# Patient Record
Sex: Female | Born: 1950 | Race: White | Hispanic: No | Marital: Married | Smoking: Never smoker
Health system: Southern US, Community
[De-identification: ages and names within clinical notes are randomized; demographics above are authoritative.]

## PROBLEM LIST (undated history)

## (undated) DIAGNOSIS — E559 Vitamin D deficiency, unspecified: Secondary | ICD-10-CM

## (undated) DIAGNOSIS — H6993 Unspecified Eustachian tube disorder, bilateral: Secondary | ICD-10-CM

## (undated) DIAGNOSIS — M5030 Other cervical disc degeneration, unspecified cervical region: Secondary | ICD-10-CM

## (undated) DIAGNOSIS — U071 COVID-19: Secondary | ICD-10-CM

## (undated) DIAGNOSIS — Z87442 Personal history of urinary calculi: Secondary | ICD-10-CM

## (undated) DIAGNOSIS — H6983 Other specified disorders of Eustachian tube, bilateral: Secondary | ICD-10-CM

## (undated) DIAGNOSIS — M19041 Primary osteoarthritis, right hand: Secondary | ICD-10-CM

## (undated) DIAGNOSIS — E538 Deficiency of other specified B group vitamins: Secondary | ICD-10-CM

## (undated) DIAGNOSIS — I10 Essential (primary) hypertension: Secondary | ICD-10-CM

## (undated) DIAGNOSIS — E7800 Pure hypercholesterolemia, unspecified: Secondary | ICD-10-CM

## (undated) DIAGNOSIS — E669 Obesity, unspecified: Secondary | ICD-10-CM

## (undated) DIAGNOSIS — F419 Anxiety disorder, unspecified: Secondary | ICD-10-CM

## (undated) DIAGNOSIS — F32A Depression, unspecified: Secondary | ICD-10-CM

## (undated) DIAGNOSIS — D649 Anemia, unspecified: Secondary | ICD-10-CM

## (undated) DIAGNOSIS — F5104 Psychophysiologic insomnia: Secondary | ICD-10-CM

## (undated) DIAGNOSIS — E2839 Other primary ovarian failure: Secondary | ICD-10-CM

## (undated) DIAGNOSIS — F329 Major depressive disorder, single episode, unspecified: Secondary | ICD-10-CM

## (undated) DIAGNOSIS — S0300XA Dislocation of jaw, unspecified side, initial encounter: Secondary | ICD-10-CM

## (undated) DIAGNOSIS — E8881 Metabolic syndrome: Secondary | ICD-10-CM

## (undated) DIAGNOSIS — K911 Postgastric surgery syndromes: Secondary | ICD-10-CM

## (undated) HISTORY — PX: PARTIAL COLECTOMY: SHX5273

## (undated) HISTORY — DX: Dislocation of jaw, unspecified side, initial encounter: S03.00XA

## (undated) HISTORY — DX: Other cervical disc degeneration, unspecified cervical region: M50.30

## (undated) HISTORY — DX: Unspecified eustachian tube disorder, bilateral: H69.93

## (undated) HISTORY — DX: Anxiety disorder, unspecified: F41.9

## (undated) HISTORY — DX: Metabolic syndrome: E88.810

## (undated) HISTORY — DX: Depression, unspecified: F32.A

## (undated) HISTORY — DX: Obesity, unspecified: E66.9

## (undated) HISTORY — DX: Deficiency of other specified B group vitamins: E53.8

## (undated) HISTORY — DX: Other specified disorders of eustachian tube, bilateral: H69.83

## (undated) HISTORY — PX: APPENDECTOMY: SHX54

## (undated) HISTORY — DX: Psychophysiologic insomnia: F51.04

## (undated) HISTORY — DX: Vitamin D deficiency, unspecified: E55.9

## (undated) HISTORY — PX: WISDOM TOOTH EXTRACTION: SHX21

## (undated) HISTORY — DX: Metabolic syndrome: E88.81

## (undated) HISTORY — DX: Primary osteoarthritis, right hand: M19.041

## (undated) HISTORY — PX: ABDOMINAL HYSTERECTOMY: SHX81

## (undated) HISTORY — DX: Postgastric surgery syndromes: K91.1

## (undated) HISTORY — DX: Other primary ovarian failure: E28.39

## (undated) HISTORY — DX: Major depressive disorder, single episode, unspecified: F32.9

---

## 1999-05-04 ENCOUNTER — Encounter: Payer: Self-pay | Admitting: Family Medicine

## 1999-05-04 ENCOUNTER — Encounter: Admission: RE | Admit: 1999-05-04 | Discharge: 1999-05-04 | Payer: Self-pay | Admitting: Family Medicine

## 2000-08-16 ENCOUNTER — Encounter: Admission: RE | Admit: 2000-08-16 | Discharge: 2000-08-16 | Payer: Self-pay | Admitting: Family Medicine

## 2000-08-16 ENCOUNTER — Encounter: Payer: Self-pay | Admitting: Family Medicine

## 2002-10-08 ENCOUNTER — Encounter: Payer: Self-pay | Admitting: Internal Medicine

## 2002-10-08 ENCOUNTER — Encounter: Admission: RE | Admit: 2002-10-08 | Discharge: 2002-10-08 | Payer: Self-pay | Admitting: Internal Medicine

## 2003-12-08 ENCOUNTER — Encounter: Admission: RE | Admit: 2003-12-08 | Discharge: 2003-12-08 | Payer: Self-pay | Admitting: Internal Medicine

## 2004-08-18 ENCOUNTER — Ambulatory Visit: Payer: Self-pay | Admitting: Internal Medicine

## 2004-10-25 ENCOUNTER — Ambulatory Visit: Payer: Self-pay | Admitting: Family Medicine

## 2004-11-02 ENCOUNTER — Ambulatory Visit: Payer: Self-pay | Admitting: Family Medicine

## 2004-11-05 ENCOUNTER — Ambulatory Visit: Payer: Self-pay | Admitting: Cardiology

## 2004-11-24 ENCOUNTER — Ambulatory Visit: Payer: Self-pay | Admitting: Family Medicine

## 2005-06-16 ENCOUNTER — Ambulatory Visit: Payer: Self-pay | Admitting: Family Medicine

## 2006-08-07 ENCOUNTER — Ambulatory Visit: Payer: Self-pay | Admitting: Family Medicine

## 2006-08-25 ENCOUNTER — Ambulatory Visit: Payer: Self-pay | Admitting: Internal Medicine

## 2006-09-01 ENCOUNTER — Ambulatory Visit: Payer: Self-pay | Admitting: Internal Medicine

## 2006-10-24 ENCOUNTER — Ambulatory Visit: Payer: Self-pay | Admitting: Gastroenterology

## 2006-10-24 LAB — HM COLONOSCOPY: HM Colonoscopy: NORMAL

## 2006-12-20 ENCOUNTER — Ambulatory Visit: Payer: Self-pay | Admitting: Family Medicine

## 2007-03-29 LAB — HM PAP SMEAR: HM PAP: NORMAL

## 2007-04-26 HISTORY — PX: GASTRIC BYPASS: SHX52

## 2008-08-14 ENCOUNTER — Ambulatory Visit: Payer: Self-pay | Admitting: Orthopedic Surgery

## 2010-06-22 LAB — HM DEXA SCAN

## 2011-01-12 ENCOUNTER — Ambulatory Visit: Payer: Self-pay | Admitting: Family Medicine

## 2013-07-03 LAB — LIPID PANEL
Cholesterol: 142 mg/dL (ref 0–200)
HDL: 56 mg/dL (ref 35–70)
LDL Cholesterol: 70 mg/dL
Triglycerides: 78 mg/dL (ref 40–160)

## 2013-10-13 ENCOUNTER — Emergency Department (HOSPITAL_COMMUNITY): Payer: BC Managed Care – PPO

## 2013-10-13 ENCOUNTER — Encounter (HOSPITAL_COMMUNITY): Payer: Self-pay | Admitting: Emergency Medicine

## 2013-10-13 ENCOUNTER — Emergency Department (HOSPITAL_COMMUNITY)
Admission: EM | Admit: 2013-10-13 | Discharge: 2013-10-13 | Disposition: A | Discharge status: Home / Self Care | Payer: BC Managed Care – PPO | Attending: Emergency Medicine | Admitting: Emergency Medicine

## 2013-10-13 DIAGNOSIS — R079 Chest pain, unspecified: Secondary | ICD-10-CM

## 2013-10-13 DIAGNOSIS — Z79899 Other long term (current) drug therapy: Secondary | ICD-10-CM | POA: Insufficient documentation

## 2013-10-13 DIAGNOSIS — I10 Essential (primary) hypertension: Secondary | ICD-10-CM | POA: Insufficient documentation

## 2013-10-13 DIAGNOSIS — E785 Hyperlipidemia, unspecified: Secondary | ICD-10-CM | POA: Insufficient documentation

## 2013-10-13 HISTORY — DX: Essential (primary) hypertension: I10

## 2013-10-13 HISTORY — DX: Pure hypercholesterolemia, unspecified: E78.00

## 2013-10-13 LAB — BASIC METABOLIC PANEL
BUN: 10 mg/dL (ref 6–23)
CALCIUM: 9.7 mg/dL (ref 8.4–10.5)
CO2: 27 mEq/L (ref 19–32)
CREATININE: 0.72 mg/dL (ref 0.50–1.10)
Chloride: 105 mEq/L (ref 96–112)
GFR, EST NON AFRICAN AMERICAN: 89 mL/min — AB (ref >90)
Glucose, Bld: 99 mg/dL (ref 70–99)
Potassium: 4.6 mEq/L (ref 3.7–5.3)
Sodium: 144 mEq/L (ref 137–147)

## 2013-10-13 LAB — D-DIMER, QUANTITATIVE: D-Dimer, Quant: 0.34 ug/mL-FEU (ref 0.00–0.48)

## 2013-10-13 LAB — CBC
HCT: 37.6 % (ref 36.0–46.0)
Hemoglobin: 12.2 g/dL (ref 12.0–15.0)
MCH: 28.9 pg (ref 26.0–34.0)
MCHC: 32.4 g/dL (ref 30.0–36.0)
MCV: 89.1 fL (ref 78.0–100.0)
PLATELETS: 320 10*3/uL (ref 150–400)
RBC: 4.22 MIL/uL (ref 3.87–5.11)
RDW: 13.5 % (ref 11.5–15.5)
WBC: 9 10*3/uL (ref 4.0–10.5)

## 2013-10-13 LAB — HEPATIC FUNCTION PANEL
ALK PHOS: 72 U/L (ref 39–117)
ALT: 14 U/L (ref 0–35)
AST: 18 U/L (ref 0–37)
Albumin: 3.5 g/dL (ref 3.5–5.2)
Total Bilirubin: 0.2 mg/dL — ABNORMAL LOW (ref 0.3–1.2)
Total Protein: 6.9 g/dL (ref 6.0–8.3)

## 2013-10-13 LAB — I-STAT TROPONIN, ED: Troponin i, poc: 0.01 ng/mL (ref 0.00–0.08)

## 2013-10-13 LAB — TROPONIN I
Troponin I: <0.3 ng/mL (ref <0.30)
Troponin I: <0.3 ng/mL (ref <0.30)

## 2013-10-13 MED ORDER — ASPIRIN 325 MG PO TABS: 325.0000 mg | ORAL_TABLET | Freq: Once | ORAL | Status: DC

## 2013-10-13 MED ORDER — SODIUM CHLORIDE 0.9 % IV BOLUS (SEPSIS)
250.0000 mL | Freq: Once | INTRAVENOUS | Status: AC
  Administered 2013-10-13: 250 mL via INTRAVENOUS

## 2013-10-13 NOTE — ED Provider Notes (Signed)
CSN: [id number]     Arrival date & time 10/13/13  1315 History   First MD Initiated Contact with Patient 10/13/13 1531     Chief Complaint  Patient presents with  . Chest Pain     (Consider location/radiation/quality/duration/timing/severity/associated sxs/prior Treatment) The history is provided by the patient. No language interpreter was used.  Alexandria Newman is a 63 y/o F with PMHx of HTN and HCL presenting to the ED with chest pain that started yesterday at approximately 2:00 PM when patient came back home from shopping. Reported that the pain is localized to the left side of the chest described as a muscular discomfort has been constant-reported that yesterday when she palpated the left side of her chest she reported discomfort. Reported that she thought this was just regular discomfort or muscular in nature - stated that she took a Tylenol arthritis secondary to the pain with minimal relief. Reported that she woke up this morning with the pain - stated that the pain is no longer reproducible upon palpation. Stated that exertion makes the pain worse. Denied chest tightness, shortness of breath, difficulty breathing, neck pain, back pain, jaw pain, numbness, tingling, blurred vision, sudden loss of vision, dizziness, lightheadedness, fainting, numbness or tingling down the left arm, radiation of pain, travel, leg swelling. PCP none   Past Medical History  Diagnosis Date  . Hypertension   . Hypercholesteremia    Past Surgical History  Procedure Laterality Date  . Gastric bypass  2009  . Appendectomy    . Abdominal hysterectomy     History reviewed. No pertinent family history. History  Substance Use Topics  . Smoking status: Never Smoker   . Smokeless tobacco: Not on file  . Alcohol Use: No   OB History   Grav Para Term Preterm Abortions TAB SAB Ect Mult Living            2     Review of Systems  Constitutional: Negative for fever and chills.  Respiratory:  Negative for chest tightness and shortness of breath.   Cardiovascular: Positive for chest pain. Negative for palpitations and leg swelling.  Gastrointestinal: Negative for nausea, vomiting, abdominal pain and diarrhea.  Musculoskeletal: Negative for back pain and neck pain.  Neurological: Negative for dizziness, weakness, light-headedness and numbness.      Allergies  Review of patient's allergies indicates no known allergies.  Home Medications   Prior to Admission medications   Medication Sig Start Date End Date Taking? Authorizing Pierce Barocio  ALPRAZolam Duanne Moron) 0.5 MG tablet Take 0.5 mg by mouth at bedtime.  10/01/13  Yes Historical Aureliano Oshields, MD  atenolol (TENORMIN) 50 MG tablet Take 50 mg by mouth 2 (two) times daily.  10/01/13  Yes Historical Marysol Wellnitz, MD  B-12, METHYLCOBALAMIN, SL Place 1 tablet under the tongue every morning.   Yes Historical Tris Howell, MD  buPROPion (WELLBUTRIN XL) 150 MG 24 hr tablet Take 150 mg by mouth daily.  09/17/13  Yes Historical Jamaiyah Pyle, MD  Cholecalciferol (VITAMIN D PO) Take 1 tablet by mouth daily.   Yes Historical Natacia Chaisson, MD  citalopram (CELEXA) 20 MG tablet Take 20 mg by mouth daily.  08/07/13  Yes Historical Masiah Lewing, MD  IRON PO Take 1 tablet by mouth daily.   Yes Historical Ival Basquez, MD  lisinopril (PRINIVIL,ZESTRIL) 10 MG tablet Take 10 mg by mouth 2 (two) times daily.  09/28/13  Yes Historical Azalea Cedar, MD  simvastatin (ZOCOR) 20 MG tablet Take 20 mg by mouth every evening.  09/28/13  Yes  Historical Ameera Tigue, MD   BP 118/54  Pulse 56  Temp(Src) 98.6 F (37 C) (Oral)  Resp 14  SpO2 97% Physical Exam  Nursing note and vitals reviewed. Constitutional: She is oriented to person, place, and time. She appears well-developed and well-nourished. No distress.  HENT:  Head: Normocephalic and atraumatic.  Mouth/Throat: Oropharynx is clear and moist. No oropharyngeal exudate.  Negative trismus  Eyes: Conjunctivae and EOM are normal. Pupils are equal,  round, and reactive to light. Right eye exhibits no discharge. Left eye exhibits no discharge.  Neck: Normal range of motion. Neck supple. No tracheal deviation present.  Negative neck stiffness Negative nuchal rigidity Negative cervical lymphadenopathy Negative pain upon palpation to C-spine  Cardiovascular: Normal rate, regular rhythm and normal heart sounds.  Exam reveals no friction rub.   No murmur heard. Pulses:      Radial pulses are 2+ on the right side, and 2+ on the left side.       Dorsalis pedis pulses are 2+ on the right side, and 2+ on the left side.  Cap refill cannot be performed secondary to patient's nails being painted Negative swelling or pitting edema identified to lower extremities bilaterally  Pulmonary/Chest: Effort normal and breath sounds normal. No respiratory distress. She has no wheezes. She has no rales. She exhibits no tenderness.  Patient is able to speak in full senses that difficulty Negative use of accessory muscles Negative stridor Negative pain upon palpation to the chest wall  Musculoskeletal: Normal range of motion.  Full ROM to upper and lower extremities without difficulty noted, negative ataxia noted.  Lymphadenopathy:    She has no cervical adenopathy.  Neurological: She is alert and oriented to person, place, and time. No cranial nerve deficit. She exhibits normal muscle tone. Coordination normal.  Cranial nerves III-XII grossly intact Strength 5+/5+ to upper and lower extremities bilaterally with resistance applied, equal distribution noted Equal grip strength  Negative facial drooping Negative slurred speech Negative aphasia  Skin: Skin is warm and dry. No rash noted. She is not diaphoretic. No erythema.  Psychiatric: She has a normal mood and affect. Her behavior is normal. Thought content normal.    ED Course  Procedures (including critical care time)  6:13 PM This Joaquin Knebel spoke with Dr. Garwin Brothers - [facility] - discussed  case, history, labs, imaging in great detail. As per physician, reported that she does not think patient needs to be admitted to the hospital - that she calculated the HEART score to be 3 and that this qualifies for the patient to be treated as an outpatient. Will discussed with attending.  Discussed this with attending physician, Dr. Purcell Mouton who recommended second troponin to be performed negative patient can be discharged home with followup with outpatient primary care Mikah Poss. Discussed with patient plan - who agrees to plan of care and understood.   Results for orders placed during the hospital encounter of 10/13/13  CBC      Result Value Ref Range   WBC 9.0  4.0 - 10.5 K/uL   RBC 4.22  3.87 - 5.11 MIL/uL   Hemoglobin 12.2  12.0 - 15.0 g/dL   HCT 37.6  36.0 - 46.0 %   MCV 89.1  78.0 - 100.0 fL   MCH 28.9  26.0 - 34.0 pg   MCHC 32.4  30.0 - 36.0 g/dL   RDW 13.5  11.5 - 15.5 %   Platelets 320  150 - 400 K/uL  BASIC METABOLIC PANEL  Result Value Ref Range   Sodium 144  137 - 147 mEq/L   Potassium 4.6  3.7 - 5.3 mEq/L   Chloride 105  96 - 112 mEq/L   CO2 27  19 - 32 mEq/L   Glucose, Bld 99  70 - 99 mg/dL   BUN 10  6 - 23 mg/dL   Creatinine, Ser 0.72  0.50 - 1.10 mg/dL   Calcium 9.7  8.4 - 10.5 mg/dL   GFR calc non Af Amer 89 (*) >90 mL/min   GFR calc Af Amer >90  >90 mL/min  TROPONIN I      Result Value Ref Range   Troponin I <0.30  <0.30 ng/mL  D-DIMER, QUANTITATIVE      Result Value Ref Range   D-Dimer, Quant 0.34  0.00 - 0.48 ug/mL-FEU  HEPATIC FUNCTION PANEL      Result Value Ref Range   Total Protein 6.9  6.0 - 8.3 g/dL   Albumin 3.5  3.5 - 5.2 g/dL   AST 18  0 - 37 U/L   ALT 14  0 - 35 U/L   Alkaline Phosphatase 72  39 - 117 U/L   Total Bilirubin 0.2 (*) 0.3 - 1.2 mg/dL   Bilirubin, Direct <0.2  0.0 - 0.3 mg/dL   Indirect Bilirubin NOT CALCULATED  0.3 - 0.9 mg/dL  TROPONIN I      Result Value Ref Range   Troponin I <0.30  <0.30 ng/mL  I-STAT TROPOININ, ED       Result Value Ref Range   Troponin i, poc 0.01  0.00 - 0.08 ng/mL   Comment 3             Labs Review Labs Reviewed  BASIC METABOLIC PANEL - Abnormal; Notable for the following:    GFR calc non Af Amer 89 (*)    All other components within normal limits  HEPATIC FUNCTION PANEL - Abnormal; Notable for the following:    Total Bilirubin 0.2 (*)    All other components within normal limits  CBC  TROPONIN I  D-DIMER, QUANTITATIVE  TROPONIN I  Randolm Idol, ED    Imaging Review Dg Chest 2 View  10/13/2013   CLINICAL DATA:  Chest pain  EXAM: CHEST  2 VIEW  COMPARISON:  None.  FINDINGS: The cardiomediastinal silhouette is unremarkable.  There is no evidence of focal airspace disease, pulmonary edema, suspicious pulmonary nodule/mass, pleural effusion, or pneumothorax. No acute bony abnormalities are identified.  IMPRESSION: No active cardiopulmonary disease.   Electronically Signed   By: Hassan Rowan M.D.   On: 10/13/2013 14:40     EKG Interpretation   Date/Time:  Sunday October 13 2013 13:21:27 EDT Ventricular Rate:  63 PR Interval:  154 QRS Duration: 88 QT Interval:  400 QTC Calculation: 409 R Axis:   75 Text Interpretation:  Normal sinus rhythm Low voltage QRS No previous  tracing Confirmed by Maryan Rued  MD, WHITNEY ([id number]) on 10/13/2013 5:48:10 PM      MDM   Final diagnoses:  Chest pain, unspecified chest pain type   Medications  sodium chloride 0.9 % bolus 250 mL (0 mLs Intravenous Stopped 10/13/13 1933)   Filed Vitals:   10/13/13 1815 10/13/13 1830 10/13/13 1845 10/13/13 1900  BP: 135/52 126/64 126/50 118/54  Pulse: 53 57 57 56  Temp:      TempSrc:      Resp: 10 14 17 14   SpO2: 98% 97% 98% 97%   EKG noted normal  sinus rhythm with a heart rate of 63 beats per minute. First troponin negative elevation. Second troponin negative elevation. D-dimer negative elevation - 0.34. CBC negative elevated white blood cell count. BMP negative findings-kidney functioning well.  Electrolytes balanced. Hepatic function panel negative elevation. Chest x-ray negative for acute cardiac pulmonary disease. Labs unremarkable. Doubt PE. Patient presenting to the ED with non-specific chest pain. Patient has not had chest pain or shortness of breath while in the ED setting. Patient stable, afebrile. Patient not septic appearing. Patient not hypoxic or in any form of respiratory distress. Patient has a HEART score of 3 and can be followed as an outpatient. Patient discharged. Discussed with patient to rest and stay hydrated. Referred patient to PCP and cardiology - discussed with patient to call and see physician first thing tomorrow morning. Discussed with patient to closely monitor symptoms and if symptoms are to worsen or change to report back to the ED - strict return instructions given.  Patient agreed to plan of care, understood, all questions answered.   Jamse Mead, PA-C 10/13/13 2101

## 2013-10-13 NOTE — ED Notes (Signed)
Patient has not any chest discomfort while been seen here in the ER.

## 2013-10-13 NOTE — ED Notes (Signed)
Pt reports non-radiating left sided chest tightness starting yesterday afternoon. States pain worse with palpation to area. Denies SOB, N/V, diaphoresis. NAD.

## 2013-10-13 NOTE — Discharge Instructions (Signed)
Please call your doctor for a followup appointment within 24-48 hours. When you talk to your doctor please let them know that you were seen in the emergency department and have them acquire all of your records so that they can discuss the findings with you and formulate a treatment plan to fully care for your new and ongoing problems. Please call and set-up an appointment with your primary care provider to be seen and assessed first thing tomorrow morning  Please call and set-up an appointment with cardiology Please rest and stay hydrated Please avoid any physical or strenuous activity Please continue to monitor symptoms closely and if symptoms are to worsen or change (fever greater than 101, dizziness, chills, sweating, nausea, vomiting, chest pain, shortness of breath, difficulty breathing, numbness, tingling, arm pain, neck pain, jaw pain, fainting) please report back to the ED immediately    Chest Pain (Nonspecific) It is often hard to give a specific diagnosis for the cause of chest pain. There is always a chance that your pain could be related to something serious, such as a heart attack or a blood clot in the lungs. You need to follow up with your health care provider for further evaluation. CAUSES   Heartburn.  Pneumonia or bronchitis.  Anxiety or stress.  Inflammation around your heart (pericarditis) or lung (pleuritis or pleurisy).  A blood clot in the lung.  A collapsed lung (pneumothorax). It can develop suddenly on its own (spontaneous pneumothorax) or from trauma to the chest.  Shingles infection (herpes zoster virus). The chest wall is composed of bones, muscles, and cartilage. Any of these can be the source of the pain.  The bones can be bruised by injury.  The muscles or cartilage can be strained by coughing or overwork.  The cartilage can be affected by inflammation and become sore (costochondritis). DIAGNOSIS  Lab tests or other studies may be needed to find the  cause of your pain. Your health care provider may have you take a test called an ambulatory electrocardiogram (ECG). An ECG records your heartbeat patterns over a 24-hour period. You may also have other tests, such as:  Transthoracic echocardiogram (TTE). During echocardiography, sound waves are used to evaluate how blood flows through your heart.  Transesophageal echocardiogram (TEE).  Cardiac monitoring. This allows your health care provider to monitor your heart rate and rhythm in real time.  Holter monitor. This is a portable device that records your heartbeat and can help diagnose heart arrhythmias. It allows your health care provider to track your heart activity for several days, if needed.  Stress tests by exercise or by giving medicine that makes the heart beat faster. TREATMENT   Treatment depends on what may be causing your chest pain. Treatment may include:  Acid blockers for heartburn.  Anti-inflammatory medicine.  Pain medicine for inflammatory conditions.  Antibiotics if an infection is present.  You may be advised to change lifestyle habits. This includes stopping smoking and avoiding alcohol, caffeine, and chocolate.  You may be advised to keep your head raised (elevated) when sleeping. This reduces the chance of acid going backward from your stomach into your esophagus. Most of the time, nonspecific chest pain will improve within 2-3 days with rest and mild pain medicine.  HOME CARE INSTRUCTIONS   If antibiotics were prescribed, take them as directed. Finish them even if you start to feel better.  For the next few days, avoid physical activities that bring on chest pain. Continue physical activities as directed.  Do not use any tobacco products, including cigarettes, chewing tobacco, or electronic cigarettes.  Avoid drinking alcohol.  Only take medicine as directed by your health care provider.  Follow your health care provider's suggestions for further testing  if your chest pain does not go away.  Keep any follow-up appointments you made. If you do not go to an appointment, you could develop lasting (chronic) problems with pain. If there is any problem keeping an appointment, call to reschedule. SEEK MEDICAL CARE IF:   Your chest pain does not go away, even after treatment.  You have a rash with blisters on your chest.  You have a fever. SEEK IMMEDIATE MEDICAL CARE IF:   You have increased chest pain or pain that spreads to your arm, neck, jaw, back, or abdomen.  You have shortness of breath.  You have an increasing cough, or you cough up blood.  You have severe back or abdominal pain.  You feel nauseous or vomit.  You have severe weakness.  You faint.  You have chills. This is an emergency. Do not wait to see if the pain will go away. Get medical help at once. Call your local emergency services (911 in U.S[place] Do not drive yourself to the hospital. MAKE SURE YOU:   Understand these instructions.  Will watch your condition.  Will get help right away if you are not doing well or get worse. Document Released: 01/19/2005 Document Revised: 04/16/2013 Document Reviewed: 11/15/2007 [facility] Patient Information 2015 [place], [place]. This information is not intended to replace advice given to you by your health care provider. Make sure you discuss any questions you have with your health care provider.   Emergency Department Resource Guide 1) Find a Doctor and Pay Out of Pocket Although you won't have to find out who is covered by your insurance plan, it is a good idea to ask around and get recommendations. You will then need to call the office and see if the doctor you have chosen will accept you as a new patient and what types of options they offer for patients who are self-pay. Some doctors offer discounts or will set up payment plans for their patients who do not have insurance, but you will need to ask so you aren't surprised when you  get to your appointment.  2) Contact Your Local Health Department Not all health departments have doctors that can see patients for sick visits, but many do, so it is worth a call to see if yours does. If you don't know where your local health department is, you can check in your phone book. The CDC also has a tool to help you locate your state's health department, and many state websites also have listings of all of their local health departments.  3) Find a [facility] If your illness is not likely to be very severe or complicated, you may want to try a walk in clinic. These are popping up all over the country in pharmacies, drugstores, and shopping centers. They're usually staffed by nurse practitioners or physician assistants that have been trained to treat common illnesses and complaints. They're usually fairly quick and inexpensive. However, if you have serious medical issues or chronic medical problems, these are probably not your best option.  No Primary Care Doctor: - Call Health Connect at  [phone] - they can help you locate a primary care doctor that  accepts your insurance, provides certain services, etc. - Physician Referral Service- [phone]  Chronic Pain Problems: Organization  Address  Phone   Notes  [facility]  [phone] Patients need to be referred by their primary care doctor.   Medication Assistance: Organization         Address  Phone   Notes  [facility] Medication [facility] [place] [address] [place], [place] [address] [phone] --Must be a resident of [facility] -- Must have NO insurance coverage whatsoever (no Medicaid/ Medicare, etc.) -- The pt. MUST have a primary care doctor that directs their care regularly and follows them in the community   [facility]  [phone]   [employer]  [phone]    Agencies that provide inexpensive medical care: Organization         Address  Phone    Notes  [place]  [phone]   [facility]    [phone]   [facility] [place], [place] [address] [phone]   [place] [address], [place] [phone]   Planned Parenthood    [phone]   [facility]    [phone]   [place] and [place] [address], [place] Phone:  [phone], Fax:  [phone] Hours of Operation:  9 am - 6 pm, M-F.  Also accepts Medicaid/Medicare and self-pay.  [facility] for [place] [address], [address], [place] Phone: [phone], Fax: [phone]. Hours of Operation:  8:30 am - 5:30 pm, M-F.  Also accepts Medicaid and self-pay.  [facility] [place] [address] [place] Phone: [phone]   [place], [place], [place] [phone], Ext. [phone] Mondays & Thursdays: 7-9 AM.  First 15 patients are seen on a first come, first serve basis.    [place] Providers:  Organization         Address  Phone   Notes  [facility] [address], [place] [phone] Also accepts self-pay patients.  [facility] ([place]) [address], [place]  [phone]   [facility], [address], [place] [phone]   [facility] [address] [place] [phone]   Lucianne Lei [address], [place]   [phone] Only accepts [place] Access [place] patients after they have their name applied to their card.   Self-Pay (no insurance) in [facility]:  Organization         Address  Phone   Notes  Sickle Cell Patients, [facility] [place] [phone]   [facility] [place] [phone]   [facility]  Urgent Care [place]  [place], [place], [place] [phone]   [facility]/Dr. Osei-Bonsu  [address] [place] or [address], [place] [phone] Phone number for both [place] and [place] locations is the same.  Urgent Medical and [facility] [address], [place] [phone]   [facility] [address] [place] [address], [place] or [address] [phone]   [facility] [address], [place] [phone], phone; [phone], fax Sees patients 1st and 3rd Saturday of every month.  Must not qualify  for public or private insurance (i.e. Medicaid, Medicare, [place] Health Choice, Veterans' Benefits)  Household income should be no more than 200% of the poverty level The clinic cannot treat you if you are pregnant or think you are pregnant  Sexually transmitted diseases are not treated at the clinic.    Dental Care: Organization         Address  Phone  Notes  [facility] Department of [facility] [phone] Accepts children up to age 62 who are enrolled in [place] or [place]; pregnant women with a Medicaid card; and children who have applied for Medicaid or [place] Health Choice, but were declined, whose parents can pay a reduced fee at time of service.  [facility] Department of [facility]  [address], [place] [phone] Accepts children up to age 76 who are enrolled in [place] or [place]; pregnant women with a Medicaid card; and children who have applied for Medicaid or [place] Health Choice, but were declined, whose parents can pay a reduced fee at time of service.  [place] Adult Dental Access PROGRAM  [place] [phone] Patients are seen by appointment only. Walk-ins are not accepted. [place] will see patients 50 years of age  and older. Monday - Tuesday (8am-5pm) Most Wednesdays (8:30-5pm) $30 per visit, cash only  [facility] Adult Dental Access PROGRAM  [address], [facility] [phone] Patients are seen by appointment only. Walk-ins are not accepted. [place] will see patients 93 years of age and older. One Wednesday Evening (Monthly: Volunteer Based).  $30 per visit, cash only  [place]  [phone] for adults; Children under age 72, call Graduate Pediatric Dentistry at [phone]. Children aged 51-14, please call [phone] to request a pediatric application.  Dental services are provided in all areas of dental care including fillings, crowns and bridges, complete and partial dentures, implants, gum treatment, root canals, and extractions. Preventive care is also provided. Treatment is provided to both adults and children. Patients are selected via a lottery and there is often a waiting list.   [facility] [address], [place]  [phone] [email or web address]   [facility] [address] [place], [place] [phone], Ext. [phone] Second and Fourth Thursday of each month, opens at 6:30 AM; Clinic ends at 9 AM.  Patients are seen on a first-come first-served basis, and a limited number are seen during each clinic.   [facility]  [address] Hillard Danker [place], [place] [phone]   Eligibility Requirements You must have lived in [place], [place], or [place] counties for at least the last three months.   You cannot be eligible for state or federal sponsored [employer], including [employer], [place], or [employer].   You generally cannot be eligible for healthcare insurance through your employer.    How to apply: Eligibility screenings are held every Tuesday and Wednesday afternoon from 1:00 pm until 4:00 pm. You do not need an appointment for the interview!  [facility] [address], [place], [place]   [place]  [place] Department  [place]  [phone]    Behavioral Health Resources in the Community: Intensive Outpatient Programs Organization         Address  Phone  Notes  [facility] [address] [place], [place] [phone]   [facility] Outpatient [address] [place], [place]   ADS: Alcohol & Drug Svcs [address], [place], [place]   [place] [address],  [place], [place] or [phone]   Substance Abuse Resources Organization         Address  Phone  Notes  Alcohol and Drug Services  [phone]   [facility]  [phone]   [facility] [place]   Chinita Pester  [phone]   Residential & Outpatient Substance Abuse Program  [phone]   Psychological Services Organization         Address  Phone  Notes  [facility] [place]  [place]  [phone]   [place] [address], [place] or [phone]    Mobile Crisis Teams Organization         Address  Phone  Notes  [facility], Mobile Crisis Care Unit  [phone]   [facility] Psychotherapeutic Services  [address] [place], [place]   [place] [address] [place] [phone]    Self-Help/Support Groups Organization         Address  Phone             Notes  [place]. of [place] - variety of support groups  [place] Call for more information  Narcotics Anonymous (NA), [facility] [address], [employer] [place]  2 meetings at this location   Special educational needs teacher         Address  Phone  Notes  ASAP Residential Treatment [place],    [place]  [phone]   [facility]  [address] [place] [address], [place], [place]   [place], [place] [phone] Admissions: 8am-3pm M-F  Incentives Substance [place] [address]    [place], [place] [phone]   [facility] [address] [place], [place], [place]   [facility] [address],  [place], [place]   [facility] - Intensive Outpatient [address]een [address], [place], [place]   [facility] ([place].,  [place], [place] [phone] or [phone]   [facility] ([facility]) [address] [place], [place] Accepts Medicaid  [facility] [place] [address]  [place] [phone] Substance Abuse/Addiction Treatment   [facility] Organization         Address  Phone  Notes  [facility]  [phone]   Domenic Schwab, PhD [address] Arlis Porta [place], [place]   [phone] or [phone]   [place]   [address] [place], [place] [phone]   [facility] [address] [place], [place] [phone] Insurance/Medicaid/sponsorship through [facility] and Families [address], [place]                                    [place], [place] [phone] [place] [address], [place] [phone]    Dr. Adele Schilder  [phone]   [facility] [place]  [facility]. 1) [address], [place] 2) [place] 3)  [place] [address], [place] [phone]  [phone]   [place] [phone] or [phone] (After Hours)

## 2013-10-13 NOTE — ED Notes (Signed)
Called and ordered patient a food tray.

## 2013-10-15 NOTE — ED Provider Notes (Signed)
Medical screening examination/treatment/procedure(s) were performed by non-physician practitioner and as supervising physician I was immediately available for consultation/collaboration.   EKG Interpretation   Date/Time:  Sunday October 13 2013 13:21:27 EDT Ventricular Rate:  63 PR Interval:  154 QRS Duration: 88 QT Interval:  400 QTC Calculation: 409 R Axis:   75 Text Interpretation:  Normal sinus rhythm Low voltage QRS No previous  tracing Confirmed by Maryan Rued  MD, Loree Fee ([id number]) on 10/13/2013 5:48:10 PM        Blanchie Dessert, MD 10/15/13 1503

## 2013-11-06 ENCOUNTER — Encounter (HOSPITAL_COMMUNITY): Payer: Self-pay | Admitting: Emergency Medicine

## 2013-11-06 ENCOUNTER — Observation Stay (HOSPITAL_COMMUNITY)
Admission: EM | Admit: 2013-11-06 | Discharge: 2013-11-07 | Disposition: A | Discharge status: Home / Self Care | Payer: BC Managed Care – PPO | Attending: Surgery | Admitting: Surgery

## 2013-11-06 ENCOUNTER — Emergency Department (HOSPITAL_COMMUNITY): Payer: BC Managed Care – PPO

## 2013-11-06 DIAGNOSIS — R112 Nausea with vomiting, unspecified: Secondary | ICD-10-CM | POA: Diagnosis not present

## 2013-11-06 DIAGNOSIS — K56609 Unspecified intestinal obstruction, unspecified as to partial versus complete obstruction: Secondary | ICD-10-CM

## 2013-11-06 DIAGNOSIS — E7800 Pure hypercholesterolemia, unspecified: Secondary | ICD-10-CM | POA: Insufficient documentation

## 2013-11-06 DIAGNOSIS — Z9884 Bariatric surgery status: Secondary | ICD-10-CM | POA: Insufficient documentation

## 2013-11-06 DIAGNOSIS — E860 Dehydration: Secondary | ICD-10-CM

## 2013-11-06 DIAGNOSIS — I10 Essential (primary) hypertension: Secondary | ICD-10-CM | POA: Insufficient documentation

## 2013-11-06 DIAGNOSIS — R1033 Periumbilical pain: Secondary | ICD-10-CM | POA: Diagnosis not present

## 2013-11-06 DIAGNOSIS — R1030 Lower abdominal pain, unspecified: Secondary | ICD-10-CM

## 2013-11-06 LAB — URINALYSIS, ROUTINE W REFLEX MICROSCOPIC
Glucose, UA: NEGATIVE mg/dL
Hgb urine dipstick: NEGATIVE
Ketones, ur: >80 mg/dL — AB
NITRITE: NEGATIVE
PH: 5.5 (ref 5.0–8.0)
Protein, ur: 30 mg/dL — AB
Specific Gravity, Urine: 1.027 (ref 1.005–1.030)
Urobilinogen, UA: 1 mg/dL (ref 0.0–1.0)

## 2013-11-06 LAB — CBC WITH DIFFERENTIAL/PLATELET
Basophils Absolute: 0 10*3/uL (ref 0.0–0.1)
Basophils Relative: 0 % (ref 0–1)
Eosinophils Absolute: 0 10*3/uL (ref 0.0–0.7)
Eosinophils Relative: 0 % (ref 0–5)
HEMATOCRIT: 40.3 % (ref 36.0–46.0)
Hemoglobin: 13.5 g/dL (ref 12.0–15.0)
LYMPHS PCT: 5 % — AB (ref 12–46)
Lymphs Abs: 1.1 10*3/uL (ref 0.7–4.0)
MCH: 29.3 pg (ref 26.0–34.0)
MCHC: 33.5 g/dL (ref 30.0–36.0)
MCV: 87.6 fL (ref 78.0–100.0)
MONO ABS: 0.9 10*3/uL (ref 0.1–1.0)
Monocytes Relative: 4 % (ref 3–12)
NEUTROS ABS: 18.6 10*3/uL — AB (ref 1.7–7.7)
Neutrophils Relative %: 91 % — ABNORMAL HIGH (ref 43–77)
Platelets: 324 10*3/uL (ref 150–400)
RBC: 4.6 MIL/uL (ref 3.87–5.11)
RDW: 13.2 % (ref 11.5–15.5)
WBC: 20.7 10*3/uL — AB (ref 4.0–10.5)

## 2013-11-06 LAB — COMPREHENSIVE METABOLIC PANEL
ALBUMIN: 4.2 g/dL (ref 3.5–5.2)
ALT: 14 U/L (ref 0–35)
ANION GAP: 20 — AB (ref 5–15)
AST: 23 U/L (ref 0–37)
Alkaline Phosphatase: 81 U/L (ref 39–117)
BILIRUBIN TOTAL: 0.5 mg/dL (ref 0.3–1.2)
BUN: 14 mg/dL (ref 6–23)
CHLORIDE: 98 meq/L (ref 96–112)
CO2: 21 mEq/L (ref 19–32)
CREATININE: 0.83 mg/dL (ref 0.50–1.10)
Calcium: 10.2 mg/dL (ref 8.4–10.5)
GFR calc non Af Amer: 73 mL/min — ABNORMAL LOW (ref >90)
GFR, EST AFRICAN AMERICAN: 85 mL/min — AB (ref >90)
Glucose, Bld: 212 mg/dL — ABNORMAL HIGH (ref 70–99)
Potassium: 4.4 mEq/L (ref 3.7–5.3)
Sodium: 139 mEq/L (ref 137–147)
TOTAL PROTEIN: 7.8 g/dL (ref 6.0–8.3)

## 2013-11-06 LAB — URINE MICROSCOPIC-ADD ON

## 2013-11-06 LAB — LIPASE, BLOOD: Lipase: 46 U/L (ref 11–59)

## 2013-11-06 MED ORDER — SODIUM CHLORIDE 0.9 % IV SOLN
1000.0000 mL | Freq: Once | INTRAVENOUS | Status: AC
  Administered 2013-11-06: 1000 mL via INTRAVENOUS

## 2013-11-06 MED ORDER — DIPHENHYDRAMINE HCL 50 MG/ML IJ SOLN
25.0000 mg | Freq: Once | INTRAMUSCULAR | Status: AC
  Administered 2013-11-06: 25 mg via INTRAVENOUS
  Filled 2013-11-06: qty 1

## 2013-11-06 MED ORDER — IOHEXOL 300 MG/ML  SOLN
100.0000 mL | Freq: Once | INTRAMUSCULAR | Status: AC | PRN
  Administered 2013-11-06: 100 mL via INTRAVENOUS

## 2013-11-06 MED ORDER — SODIUM CHLORIDE 0.9 % IV BOLUS (SEPSIS)
1000.0000 mL | Freq: Once | INTRAVENOUS | Status: AC
  Administered 2013-11-06: 1000 mL via INTRAVENOUS

## 2013-11-06 MED ORDER — SODIUM CHLORIDE 0.9 % IV SOLN
1000.0000 mL | INTRAVENOUS | Status: DC
  Administered 2013-11-06: 1000 mL via INTRAVENOUS

## 2013-11-06 MED ORDER — METOCLOPRAMIDE HCL 5 MG/ML IJ SOLN
10.0000 mg | Freq: Once | INTRAMUSCULAR | Status: AC
  Administered 2013-11-06: 10 mg via INTRAVENOUS
  Filled 2013-11-06: qty 2

## 2013-11-06 MED ORDER — ONDANSETRON HCL 4 MG/2ML IJ SOLN
4.0000 mg | Freq: Once | INTRAMUSCULAR | Status: AC
  Administered 2013-11-06: 4 mg via INTRAVENOUS
  Filled 2013-11-06: qty 2

## 2013-11-06 MED ORDER — ATENOLOL 25 MG PO TABS
50.0000 mg | ORAL_TABLET | Freq: Two times a day (BID) | ORAL | Status: DC
  Administered 2013-11-07 (×2): 50 mg via ORAL
  Filled 2013-11-06 (×3): qty 2

## 2013-11-06 NOTE — ED Provider Notes (Signed)
10:47 PM Patient signed out to me by Dr. Tomi Bamberger.  Pending CT scan.  Results for orders placed during the hospital encounter of 11/06/13  COMPREHENSIVE METABOLIC PANEL      Result Value Ref Range   Sodium 139  137 - 147 mEq/L   Potassium 4.4  3.7 - 5.3 mEq/L   Chloride 98  96 - 112 mEq/L   CO2 21  19 - 32 mEq/L   Glucose, Bld 212 (*) 70 - 99 mg/dL   BUN 14  6 - 23 mg/dL   Creatinine, Ser 0.83  0.50 - 1.10 mg/dL   Calcium 10.2  8.4 - 10.5 mg/dL   Total Protein 7.8  6.0 - 8.3 g/dL   Albumin 4.2  3.5 - 5.2 g/dL   AST 23  0 - 37 U/L   ALT 14  0 - 35 U/L   Alkaline Phosphatase 81  39 - 117 U/L   Total Bilirubin 0.5  0.3 - 1.2 mg/dL   GFR calc non Af Amer 73 (*) >90 mL/min   GFR calc Af Amer 85 (*) >90 mL/min   Anion gap 20 (*) 5 - 15  CBC WITH DIFFERENTIAL      Result Value Ref Range   WBC 20.7 (*) 4.0 - 10.5 K/uL   RBC 4.60  3.87 - 5.11 MIL/uL   Hemoglobin 13.5  12.0 - 15.0 g/dL   HCT 40.3  36.0 - 46.0 %   MCV 87.6  78.0 - 100.0 fL   MCH 29.3  26.0 - 34.0 pg   MCHC 33.5  30.0 - 36.0 g/dL   RDW 13.2  11.5 - 15.5 %   Platelets 324  150 - 400 K/uL   Neutrophils Relative % 91 (*) 43 - 77 %   Neutro Abs 18.6 (*) 1.7 - 7.7 K/uL   Lymphocytes Relative 5 (*) 12 - 46 %   Lymphs Abs 1.1  0.7 - 4.0 K/uL   Monocytes Relative 4  3 - 12 %   Monocytes Absolute 0.9  0.1 - 1.0 K/uL   Eosinophils Relative 0  0 - 5 %   Eosinophils Absolute 0.0  0.0 - 0.7 K/uL   Basophils Relative 0  0 - 1 %   Basophils Absolute 0.0  0.0 - 0.1 K/uL  URINALYSIS, ROUTINE W REFLEX MICROSCOPIC      Result Value Ref Range   Color, Urine YELLOW  YELLOW   APPearance CLEAR  CLEAR   Specific Gravity, Urine 1.027  1.005 - 1.030   pH 5.5  5.0 - 8.0   Glucose, UA NEGATIVE  NEGATIVE mg/dL   Hgb urine dipstick NEGATIVE  NEGATIVE   Bilirubin Urine MODERATE (*) NEGATIVE   Ketones, ur >80 (*) NEGATIVE mg/dL   Protein, ur 30 (*) NEGATIVE mg/dL   Urobilinogen, UA 1.0  0.0 - 1.0 mg/dL   Nitrite NEGATIVE  NEGATIVE    Leukocytes, UA SMALL (*) NEGATIVE  LIPASE, BLOOD      Result Value Ref Range   Lipase 46  11 - 59 U/L  URINE MICROSCOPIC-ADD ON      Result Value Ref Range   Squamous Epithelial / LPF FEW (*) RARE   WBC, UA 3-6  <3 WBC/hpf   RBC / HPF 0-2  <3 RBC/hpf   Bacteria, UA FEW (*) RARE   Casts HYALINE CASTS (*) NEGATIVE   Urine-Other MUCOUS PRESENT     Dg Chest 2 View  10/13/2013   CLINICAL DATA:  Chest pain  EXAM: CHEST  2 VIEW  COMPARISON:  None.  FINDINGS: The cardiomediastinal silhouette is unremarkable.  There is no evidence of focal airspace disease, pulmonary edema, suspicious pulmonary nodule/mass, pleural effusion, or pneumothorax. No acute bony abnormalities are identified.  IMPRESSION: No active cardiopulmonary disease.   Electronically Signed   By: Hassan Rowan M.D.   On: 10/13/2013 14:40   Ct Abdomen Pelvis W Contrast  11/06/2013   CLINICAL DATA:  Abdominal pain and nausea began at 0900 hours, onset of vomiting at 1500 hours, past history of hypertension and hypercholesterolemia  EXAM: CT ABDOMEN AND PELVIS WITH CONTRAST  TECHNIQUE: Multidetector CT imaging of the abdomen and pelvis was performed using the standard protocol following bolus administration of intravenous contrast. Sagittal and coronal MPR images reconstructed from axial data set.  CONTRAST:  177mL OMNIPAQUE IOHEXOL 300 MG/ML SOLN IV. Dilute oral contrast.  COMPARISON:  11/05/2004  FINDINGS: Mild bibasilar atelectasis.  Liver, spleen, pancreas, kidneys, and adrenal glands normal appearance.  Post gastric bypass surgery.  Rounded area fat attenuation with a thin rim is identified in the anterior LEFT mid abdomen, 5.0 x 3.2 x 3.4 cm most consistent with an area of fat necrosis likely postoperative.  Colon decompressed with interposition between liver and diaphragm.  Distal small bowel decompressed.  Proximal small bowel loops are dilated compatible with small bowel obstruction.  Transition from dilated to nondilated small bowel occur is  in the RIGHT mid abdomen axial image 63.  On coronal images 42-61, swirling of the small bowel mesentery is seen at the level of the obstruction, raising question of an internal hernia or volvulus.  No definite bowel wall thickening or perforation.  Tiny amount of free fluid dependently in pelvis.  No mass, adenopathy, free air, or acute osseous findings.  IMPRESSION: Small bowel obstruction in the RIGHT mid abdomen with swirling of the mesentery on particularly the coronal images raising question of internal hernia or volvulus.  Minimal free pelvic fluid but no evidence of perforation or wall thickening.  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  Findings called to Dr. Marlon Pel on 11/06/2012 at 2214 hr.   Electronically Signed   By: Lavonia Dana M.D.   On: 11/06/2013 22:16   Dg Abd Acute W/chest  11/06/2013   CLINICAL DATA:  Periumbilical pain with nausea and vomiting for 1 day  EXAM: ACUTE ABDOMEN SERIES (ABDOMEN 2 VIEW & CHEST 1 VIEW)  COMPARISON:  10/13/2013  FINDINGS: Heart size and vascular pattern are normal. Lungs are clear. No free air.  There are no abnormally dilated loops of bowel. However, there are abnormal air-fluid levels seen throughout small bowel as well as with and the large bowel. Anastomotic suture line is identified in the left lower quadrant. There are also sutures in the left upper quadrant.  IMPRESSION: Abnormal bowel gas pattern. Possibilities include gastroenteritis as well as bowel obstruction. CT of the abdomen and pelvis recommended.   Electronically Signed   By: Skipper Cliche M.D.   On: 11/06/2013 20:03    10:47 PM CT scan remarkable for small bowel obstruction. Will consult general surgery.  Discussed patient general surgery, who will see the patient and likely admit.  Montine Circle, PA-C 11/06/13 2247

## 2013-11-06 NOTE — ED Provider Notes (Signed)
CSN: [id number]     Arrival date & time 11/06/13  1750 History   First MD Initiated Contact with Patient 11/06/13 1842     Chief Complaint  Patient presents with  . Abdominal Pain  . Emesis     (Consider location/radiation/quality/duration/timing/severity/associated sxs/prior Treatment) HPI Patient reports about 8 AM this morning she started having nausea and lower abdominal pain that she describes as cramping. She was at work when the pain started and she went home and said she felt better in about 30 minutes. She went back to work and about 3 PM she states the nausea returned and the abdominal pain returned. She has had nausea and states she vomited 3-4 times at home, 3-4 times in the ambulance, in 3-4 times while she's been in the ED. She has lower abdominal pain that she describes as cramping. She denies back or flank pain. She states she felt like she had to have diarrhea but she couldn't. She has had leg cramps. She denies fever. She states when she was nauseated she was diaphoretic. She denies any abdominal bloating or  blood in her vomitus. She denies eating anything that may have made her ill. She has not been around anybody else is ill. Patient states she's never felt this way before. Patient has had a hysterectomy, appendectomy, and gastric bypass surgery.  PCP Dr Ancil Boozer, [facility]  Past Medical History  Diagnosis Date  . Hypertension   . Hypercholesteremia    Past Surgical History  Procedure Laterality Date  . Gastric bypass  2009  . Appendectomy    . Abdominal hysterectomy     History reviewed. No pertinent family history. History  Substance Use Topics  . Smoking status: Never Smoker   . Smokeless tobacco: Not on file  . Alcohol Use: No   Lives at home Lives with spouse Employed in an office  OB History   Grav Para Term Preterm Abortions TAB SAB Ect Mult Living            2     Review of Systems  All other systems reviewed and are negative.     Allergies   Review of patient's allergies indicates no known allergies.  Home Medications   Prior to Admission medications   Medication Sig Start Date End Date Taking? Authorizing Provider  ALPRAZolam Duanne Moron) 0.5 MG tablet Take 0.5 mg by mouth at bedtime.  10/01/13  Yes Historical Provider, MD  atenolol (TENORMIN) 50 MG tablet Take 50 mg by mouth 2 (two) times daily.  10/01/13  Yes Historical Provider, MD  B-12, METHYLCOBALAMIN, SL Place 1 tablet under the tongue every morning.   Yes Historical Provider, MD  buPROPion (WELLBUTRIN XL) 150 MG 24 hr tablet Take 150 mg by mouth daily.  09/17/13  Yes Historical Provider, MD  citalopram (CELEXA) 20 MG tablet Take 20 mg by mouth daily.  08/07/13  Yes Historical Provider, MD  lisinopril (PRINIVIL,ZESTRIL) 10 MG tablet Take 10 mg by mouth 2 (two) times daily.  09/28/13  Yes Historical Provider, MD  simvastatin (ZOCOR) 20 MG tablet Take 20 mg by mouth every evening.  09/28/13  Yes Historical Provider, MD   BP 132/71  Pulse 66  Temp(Src) 97.8 F (36.6 C) (Oral)  Resp 19  SpO2 97%  Vital signs normal   Physical Exam  Nursing note and vitals reviewed. Constitutional: She is oriented to person, place, and time. She appears well-developed and well-nourished.  Non-toxic appearance. She does not appear ill. No distress.  Patient  heard retching when she arrived in the ED  HENT:  Head: Normocephalic and atraumatic.  Right Ear: External ear normal.  Left Ear: External ear normal.  Nose: Nose normal. No mucosal edema or rhinorrhea.  Mouth/Throat: Mucous membranes are normal. No dental abscesses or uvula swelling.  Tongue is dry  Eyes: Conjunctivae and EOM are normal. Pupils are equal, round, and reactive to light.  Neck: Normal range of motion and full passive range of motion without pain. Neck supple.  Cardiovascular: Normal rate, regular rhythm and normal heart sounds.  Exam reveals no gallop and no friction rub.   No murmur heard. Pulmonary/Chest: Effort normal and  breath sounds normal. No respiratory distress. She has no wheezes. She has no rhonchi. She has no rales. She exhibits no tenderness and no crepitus.  Abdominal: Soft. Normal appearance and bowel sounds are normal. She exhibits no distension. There is no tenderness. There is no rebound and no guarding.  She has no pain the patient of her abdomen, it just makes her feel like she has to throw up.  Musculoskeletal: Normal range of motion. She exhibits no edema and no tenderness.  Moves all extremities well.   Neurological: She is alert and oriented to person, place, and time. She has normal strength. No cranial nerve deficit.  Skin: Skin is warm, dry and intact. No rash noted. No erythema. There is pallor.  Psychiatric: She has a normal mood and affect. Her speech is normal and behavior is normal. Her mood appears not anxious.    ED Course  Procedures (including critical care time)  Medications  0.9 %  sodium chloride infusion (1,000 mLs Intravenous New Bag/Given 11/06/13 1904)    Followed by  0.9 %  sodium chloride infusion (1,000 mLs Intravenous New Bag/Given 11/06/13 1911)  sodium chloride 0.9 % bolus 1,000 mL (not administered)  ondansetron (ZOFRAN) injection 4 mg (4 mg Intravenous Given 11/06/13 1757)  ondansetron (ZOFRAN) injection 4 mg (4 mg Intravenous Given 11/06/13 1841)  metoCLOPramide (REGLAN) injection 10 mg (10 mg Intravenous Given 11/06/13 1905)  diphenhydrAMINE (BENADRYL) injection 25 mg (25 mg Intravenous Given 11/06/13 1904)    Recheck 20:20 patient is feeling better. She states she's sleeping in her nausea and abdominal pain is better. We discussed her x-rays the need for CT scan and she is agreeable.  20:25 patient discussed with PA Marlon Pel. He is going to followup on her CT scan. She is going to continue getting IV fluids. If her scan is normal and she still feels better she can be discharged.   Labs Review Results for orders placed during the hospital encounter of 11/06/13   COMPREHENSIVE METABOLIC PANEL      Result Value Ref Range   Sodium 139  137 - 147 mEq/L   Potassium 4.4  3.7 - 5.3 mEq/L   Chloride 98  96 - 112 mEq/L   CO2 21  19 - 32 mEq/L   Glucose, Bld 212 (*) 70 - 99 mg/dL   BUN 14  6 - 23 mg/dL   Creatinine, Ser 0.83  0.50 - 1.10 mg/dL   Calcium 10.2  8.4 - 10.5 mg/dL   Total Protein 7.8  6.0 - 8.3 g/dL   Albumin 4.2  3.5 - 5.2 g/dL   AST 23  0 - 37 U/L   ALT 14  0 - 35 U/L   Alkaline Phosphatase 81  39 - 117 U/L   Total Bilirubin 0.5  0.3 - 1.2 mg/dL   GFR calc non  Af Amer 73 (*) >90 mL/min   GFR calc Af Amer 85 (*) >90 mL/min   Anion gap 20 (*) 5 - 15  CBC WITH DIFFERENTIAL      Result Value Ref Range   WBC 20.7 (*) 4.0 - 10.5 K/uL   RBC 4.60  3.87 - 5.11 MIL/uL   Hemoglobin 13.5  12.0 - 15.0 g/dL   HCT 40.3  36.0 - 46.0 %   MCV 87.6  78.0 - 100.0 fL   MCH 29.3  26.0 - 34.0 pg   MCHC 33.5  30.0 - 36.0 g/dL   RDW 13.2  11.5 - 15.5 %   Platelets 324  150 - 400 K/uL   Neutrophils Relative % 91 (*) 43 - 77 %   Neutro Abs 18.6 (*) 1.7 - 7.7 K/uL   Lymphocytes Relative 5 (*) 12 - 46 %   Lymphs Abs 1.1  0.7 - 4.0 K/uL   Monocytes Relative 4  3 - 12 %   Monocytes Absolute 0.9  0.1 - 1.0 K/uL   Eosinophils Relative 0  0 - 5 %   Eosinophils Absolute 0.0  0.0 - 0.7 K/uL   Basophils Relative 0  0 - 1 %   Basophils Absolute 0.0  0.0 - 0.1 K/uL  LIPASE, BLOOD      Result Value Ref Range   Lipase 46  11 - 59 U/L   Laboratory interpretation all normal except for leukocytosis consistent with vomiting      Imaging Review Dg Abd Acute W/chest  11/06/2013   CLINICAL DATA:  Periumbilical pain with nausea and vomiting for 1 day  EXAM: ACUTE ABDOMEN SERIES (ABDOMEN 2 VIEW & CHEST 1 VIEW)  COMPARISON:  10/13/2013  FINDINGS: Heart size and vascular pattern are normal. Lungs are clear. No free air.  There are no abnormally dilated loops of bowel. However, there are abnormal air-fluid levels seen throughout small bowel as well as with and the  large bowel. Anastomotic suture line is identified in the left lower quadrant. There are also sutures in the left upper quadrant.  IMPRESSION: Abnormal bowel gas pattern. Possibilities include gastroenteritis as well as bowel obstruction. CT of the abdomen and pelvis recommended.   Electronically Signed   By: Skipper Cliche M.D.   On: 11/06/2013 20:03     EKG Interpretation None      MDM   Final diagnoses:  Nausea and vomiting, vomiting of unspecified type  Abdominal pain, lower  Dehydration    Disposition pending   Rolland Porter, MD, [facility] Sander     Janice Norrie, MD 11/06/13 2028

## 2013-11-06 NOTE — H&P (Signed)
Alexandria Newman is an 63 y.o. female.   Chief Complaint: abd pain , nausea/vomiting HPI: 59 yof who had prior gastric bypass at [facility] a number of years ago as well as appy, hysterectomy and what she describes as laparotomies for "tapeworms" as child presents with less than 24 hours of abdominal pain associated with nausea/emesis. Belching, no real flatus today, having bms.  No fevers.  She has undergone ct and then I was called.  She tolerated ct contrast just fine and she has no more n/v.  States no real abdominal pain either. She feels almost back to normal self right now.  Past Medical History  Diagnosis Date  . Hypertension   . Hypercholesteremia     Past Surgical History  Procedure Laterality Date  . Gastric bypass  2009  . Appendectomy    . Abdominal hysterectomy      History reviewed. No pertinent family history. Social History:  reports that she has never smoked. She does not have any smokeless tobacco history on file. She reports that she does not drink alcohol or use illicit drugs.  Allergies: No Known Allergies  Meds reviewed  Results for orders placed during the hospital encounter of 11/06/13 (from the past 48 hour(s))  COMPREHENSIVE METABOLIC PANEL     Status: Abnormal   Collection Time    11/06/13  7:14 PM      Result Value Ref Range   Sodium 139  137 - 147 mEq/L   Potassium 4.4  3.7 - 5.3 mEq/L   Chloride 98  96 - 112 mEq/L   CO2 21  19 - 32 mEq/L   Glucose, Bld 212 (*) 70 - 99 mg/dL   BUN 14  6 - 23 mg/dL   Creatinine, Ser 0.83  0.50 - 1.10 mg/dL   Calcium 10.2  8.4 - 10.5 mg/dL   Total Protein 7.8  6.0 - 8.3 g/dL   Albumin 4.2  3.5 - 5.2 g/dL   AST 23  0 - 37 U/L   ALT 14  0 - 35 U/L   Alkaline Phosphatase 81  39 - 117 U/L   Total Bilirubin 0.5  0.3 - 1.2 mg/dL   GFR calc non Af Amer 73 (*) >90 mL/min   GFR calc Af Amer 85 (*) >90 mL/min   Comment: (NOTE)     The eGFR has been calculated using the CKD EPI equation.     This calculation has not  been validated in all clinical situations.     eGFR's persistently <90 mL/min signify possible Chronic Kidney     Disease.   Anion gap 20 (*) 5 - 15  CBC WITH DIFFERENTIAL     Status: Abnormal   Collection Time    11/06/13  7:14 PM      Result Value Ref Range   WBC 20.7 (*) 4.0 - 10.5 K/uL   RBC 4.60  3.87 - 5.11 MIL/uL   Hemoglobin 13.5  12.0 - 15.0 g/dL   HCT 40.3  36.0 - 46.0 %   MCV 87.6  78.0 - 100.0 fL   MCH 29.3  26.0 - 34.0 pg   MCHC 33.5  30.0 - 36.0 g/dL   RDW 13.2  11.5 - 15.5 %   Platelets 324  150 - 400 K/uL   Neutrophils Relative % 91 (*) 43 - 77 %   Neutro Abs 18.6 (*) 1.7 - 7.7 K/uL   Lymphocytes Relative 5 (*) 12 - 46 %   Lymphs Abs  1.1  0.7 - 4.0 K/uL   Monocytes Relative 4  3 - 12 %   Monocytes Absolute 0.9  0.1 - 1.0 K/uL   Eosinophils Relative 0  0 - 5 %   Eosinophils Absolute 0.0  0.0 - 0.7 K/uL   Basophils Relative 0  0 - 1 %   Basophils Absolute 0.0  0.0 - 0.1 K/uL  LIPASE, BLOOD     Status: None   Collection Time    11/06/13  7:14 PM      Result Value Ref Range   Lipase 46  11 - 59 U/L  URINALYSIS, ROUTINE W REFLEX MICROSCOPIC     Status: Abnormal   Collection Time    11/06/13  8:12 PM      Result Value Ref Range   Color, Urine YELLOW  YELLOW   APPearance CLEAR  CLEAR   Specific Gravity, Urine 1.027  1.005 - 1.030   pH 5.5  5.0 - 8.0   Glucose, UA NEGATIVE  NEGATIVE mg/dL   Hgb urine dipstick NEGATIVE  NEGATIVE   Bilirubin Urine MODERATE (*) NEGATIVE   Ketones, ur >80 (*) NEGATIVE mg/dL   Protein, ur 30 (*) NEGATIVE mg/dL   Urobilinogen, UA 1.0  0.0 - 1.0 mg/dL   Nitrite NEGATIVE  NEGATIVE   Leukocytes, UA SMALL (*) NEGATIVE  URINE MICROSCOPIC-ADD ON     Status: Abnormal   Collection Time    11/06/13  8:12 PM      Result Value Ref Range   Squamous Epithelial / LPF FEW (*) RARE   WBC, UA 3-6  <3 WBC/hpf   RBC / HPF 0-2  <3 RBC/hpf   Bacteria, UA FEW (*) RARE   Casts HYALINE CASTS (*) NEGATIVE   Urine-Other MUCOUS PRESENT     Ct  Abdomen Pelvis W Contrast  11/06/2013   CLINICAL DATA:  Abdominal pain and nausea began at 0900 hours, onset of vomiting at 1500 hours, past history of hypertension and hypercholesterolemia  EXAM: CT ABDOMEN AND PELVIS WITH CONTRAST  TECHNIQUE: Multidetector CT imaging of the abdomen and pelvis was performed using the standard protocol following bolus administration of intravenous contrast. Sagittal and coronal MPR images reconstructed from axial data set.  CONTRAST:  16mL OMNIPAQUE IOHEXOL 300 MG/ML SOLN IV. Dilute oral contrast.  COMPARISON:  11/05/2004  FINDINGS: Mild bibasilar atelectasis.  Liver, spleen, pancreas, kidneys, and adrenal glands normal appearance.  Post gastric bypass surgery.  Rounded area fat attenuation with a thin rim is identified in the anterior LEFT mid abdomen, 5.0 x 3.2 x 3.4 cm most consistent with an area of fat necrosis likely postoperative.  Colon decompressed with interposition between liver and diaphragm.  Distal small bowel decompressed.  Proximal small bowel loops are dilated compatible with small bowel obstruction.  Transition from dilated to nondilated small bowel occur is in the RIGHT mid abdomen axial image 63.  On coronal images 42-61, swirling of the small bowel mesentery is seen at the level of the obstruction, raising question of an internal hernia or volvulus.  No definite bowel wall thickening or perforation.  Tiny amount of free fluid dependently in pelvis.  No mass, adenopathy, free air, or acute osseous findings.  IMPRESSION: Small bowel obstruction in the RIGHT mid abdomen with swirling of the mesentery on particularly the coronal images raising question of internal hernia or volvulus.  Minimal free pelvic fluid but no evidence of perforation or wall thickening.  Post gastric bypass surgery with area of probable postoperative fat  necrosis in the anterior LEFT mid abdomen.  Findings called to Dr. Marlon Pel on 11/06/2012 at 2214 hr.   Electronically Signed   By: Lavonia Dana M.D.   On: 11/06/2013 22:16   Dg Abd Acute W/chest  11/06/2013   CLINICAL DATA:  Periumbilical pain with nausea and vomiting for 1 day  EXAM: ACUTE ABDOMEN SERIES (ABDOMEN 2 VIEW & CHEST 1 VIEW)  COMPARISON:  10/13/2013  FINDINGS: Heart size and vascular pattern are normal. Lungs are clear. No free air.  There are no abnormally dilated loops of bowel. However, there are abnormal air-fluid levels seen throughout small bowel as well as with and the large bowel. Anastomotic suture line is identified in the left lower quadrant. There are also sutures in the left upper quadrant.  IMPRESSION: Abnormal bowel gas pattern. Possibilities include gastroenteritis as well as bowel obstruction. CT of the abdomen and pelvis recommended.   Electronically Signed   By: Skipper Cliche M.D.   On: 11/06/2013 20:03    Review of Systems  Constitutional: Negative for fever and chills.  Respiratory: Negative for cough.   Cardiovascular: Negative for chest pain.  Gastrointestinal: Positive for nausea, vomiting and abdominal pain.    Blood pressure 125/65, pulse 74, temperature 97.8 F (36.6 C), temperature source Oral, resp. rate 21, SpO2 96.00%. Physical Exam  Constitutional: She appears well-developed and well-nourished.  Eyes: No scleral icterus.  Cardiovascular: Normal rate, regular rhythm and normal heart sounds.   Respiratory: Effort normal and breath sounds normal. She has no wheezes. She has no rales.  GI: Soft. Bowel sounds are normal. She exhibits no distension. There is no tenderness. No hernia.       Assessment/Plan Psbo, History gastric bypass and multiple abdominal surgeries  She clinically looks normal now with normal vitals, negative exam.  I am however concerned about ct scan and her wbc.  Her exam though is reliable and I don't think she needs to go to or right now. If she had ongoing bowel ischemia or internal hernia I think she would not have improved as she has.  I will make npo,  repeat films and labs in the am.  If she worsens before then will consider going to or. Discussed plan with she and her husband.  Dim Meisinger 11/06/2013, 10:58 PM

## 2013-11-06 NOTE — ED Notes (Addendum)
Pt to ED via EMS with c/o abdominal pain associated with nausea and vomiting. Pt Pt reports abdominal pain and nausea started at 900 today then vomiting onset at 1500. Pt vomiting when arriving to ED, given 4mg  zofran IV. Pt also reports legs cramps. Per EMS, BP-140/60, CBG-120.

## 2013-11-07 ENCOUNTER — Observation Stay (HOSPITAL_COMMUNITY): Payer: BC Managed Care – PPO

## 2013-11-07 DIAGNOSIS — R143 Flatulence: Secondary | ICD-10-CM | POA: Diagnosis not present

## 2013-11-07 DIAGNOSIS — R141 Gas pain: Secondary | ICD-10-CM | POA: Diagnosis not present

## 2013-11-07 LAB — BASIC METABOLIC PANEL
ANION GAP: 15 (ref 5–15)
BUN: 10 mg/dL (ref 6–23)
CALCIUM: 7.9 mg/dL — AB (ref 8.4–10.5)
CO2: 20 mEq/L (ref 19–32)
Chloride: 110 mEq/L (ref 96–112)
Creatinine, Ser: 0.74 mg/dL (ref 0.50–1.10)
GFR calc Af Amer: >90 mL/min (ref >90)
GFR, EST NON AFRICAN AMERICAN: 88 mL/min — AB (ref >90)
Glucose, Bld: 81 mg/dL (ref 70–99)
Potassium: 4.3 mEq/L (ref 3.7–5.3)
SODIUM: 145 meq/L (ref 137–147)

## 2013-11-07 LAB — CBC
HCT: 33.5 % — ABNORMAL LOW (ref 36.0–46.0)
Hemoglobin: 10.9 g/dL — ABNORMAL LOW (ref 12.0–15.0)
MCH: 29.1 pg (ref 26.0–34.0)
MCHC: 32.5 g/dL (ref 30.0–36.0)
MCV: 89.6 fL (ref 78.0–100.0)
PLATELETS: 258 10*3/uL (ref 150–400)
RBC: 3.74 MIL/uL — AB (ref 3.87–5.11)
RDW: 13.4 % (ref 11.5–15.5)
WBC: 14.7 10*3/uL — AB (ref 4.0–10.5)

## 2013-11-07 MED ORDER — CHLORHEXIDINE GLUCONATE 0.12 % MT SOLN: 15.0000 mL | Freq: Two times a day (BID) | OROMUCOSAL | Status: DC

## 2013-11-07 MED ORDER — ACETAMINOPHEN 325 MG PO TABS: 650.0000 mg | ORAL_TABLET | Freq: Four times a day (QID) | ORAL | Status: AC | PRN

## 2013-11-07 MED ORDER — ACETAMINOPHEN 325 MG PO TABS: 650.0000 mg | ORAL_TABLET | Freq: Four times a day (QID) | ORAL | Status: DC | PRN

## 2013-11-07 MED ORDER — ENOXAPARIN SODIUM 40 MG/0.4ML ~~LOC~~ SOLN
40.0000 mg | Freq: Every day | SUBCUTANEOUS | Status: DC
  Administered 2013-11-07: 40 mg via SUBCUTANEOUS
  Filled 2013-11-07: qty 0.4

## 2013-11-07 MED ORDER — MORPHINE SULFATE 2 MG/ML IJ SOLN: 2.0000 mg | INTRAMUSCULAR | Status: DC | PRN

## 2013-11-07 MED ORDER — ACETAMINOPHEN 650 MG RE SUPP: 650.0000 mg | Freq: Four times a day (QID) | RECTAL | Status: DC | PRN

## 2013-11-07 MED ORDER — BIOTENE DRY MOUTH MT LIQD: 15.0000 mL | Freq: Two times a day (BID) | OROMUCOSAL | Status: DC

## 2013-11-07 MED ORDER — SODIUM CHLORIDE 0.9 % IV SOLN
INTRAVENOUS | Status: DC
  Administered 2013-11-07 (×2): via INTRAVENOUS

## 2013-11-07 MED ORDER — ONDANSETRON HCL 4 MG/2ML IJ SOLN: 4.0000 mg | Freq: Four times a day (QID) | INTRAMUSCULAR | Status: DC | PRN

## 2013-11-07 MED ORDER — PANTOPRAZOLE SODIUM 40 MG IV SOLR
40.0000 mg | Freq: Every day | INTRAVENOUS | Status: DC
  Administered 2013-11-07: 40 mg via INTRAVENOUS
  Filled 2013-11-07 (×2): qty 40

## 2013-11-07 NOTE — Progress Notes (Signed)
Subjective: She denies any abdominal pain or nausea  Objective: Vital signs in last 24 hours: Temp:  [97.8 F (36.6 C)-98.9 F (37.2 C)] 98.5 F (36.9 C) (07/16 1010) Pulse Rate:  [57-75] 57 (07/16 1010) Resp:  [11-35] 16 (07/16 1010) BP: (110-150)/(50-71) 121/54 mmHg (07/16 1010) SpO2:  [94 %-100 %] 100 % (07/16 1010) Weight:  [186 lb 4.6 oz (84.5 kg)] 186 lb 4.6 oz (84.5 kg) (07/16 0035) Last BM Date: 11/06/13  Intake/Output from previous day: 07/15 0701 - 07/16 0700 In: 0  Out: 200 [Urine:200] Intake/Output this shift:    Abdomen is completely soft, non tender, non-distended  Lab Results:   Recent Labs  11/06/13 1914 11/07/13 0542  WBC 20.7* 14.7*  HGB 13.5 10.9*  HCT 40.3 33.5*  PLT 324 258   BMET  Recent Labs  11/06/13 1914 11/07/13 0542  NA 139 145  K 4.4 4.3  CL 98 110  CO2 21 20  GLUCOSE 212* 81  BUN 14 10  CREATININE 0.83 0.74  CALCIUM 10.2 7.9*   PT/INR No results found for this basename: LABPROT, INR,  in the last 72 hours ABG No results found for this basename: PHART, PCO2, PO2, HCO3,  in the last 72 hours  Studies/Results: Ct Abdomen Pelvis W Contrast  11/06/2013   CLINICAL DATA:  Abdominal pain and nausea began at 0900 hours, onset of vomiting at 1500 hours, past history of hypertension and hypercholesterolemia  EXAM: CT ABDOMEN AND PELVIS WITH CONTRAST  TECHNIQUE: Multidetector CT imaging of the abdomen and pelvis was performed using the standard protocol following bolus administration of intravenous contrast. Sagittal and coronal MPR images reconstructed from axial data set.  CONTRAST:  152mL OMNIPAQUE IOHEXOL 300 MG/ML SOLN IV. Dilute oral contrast.  COMPARISON:  11/05/2004  FINDINGS: Mild bibasilar atelectasis.  Liver, spleen, pancreas, kidneys, and adrenal glands normal appearance.  Post gastric bypass surgery.  Rounded area fat attenuation with Newman thin rim is identified in the anterior LEFT mid abdomen, 5.0 x 3.2 x 3.4 cm most  consistent with an area of fat necrosis likely postoperative.  Colon decompressed with interposition between liver and diaphragm.  Distal small bowel decompressed.  Proximal small bowel loops are dilated compatible with small bowel obstruction.  Transition from dilated to nondilated small bowel occur is in the RIGHT mid abdomen axial image 63.  On coronal images 42-61, swirling of the small bowel mesentery is seen at the level of the obstruction, raising question of an internal hernia or volvulus.  No definite bowel wall thickening or perforation.  Tiny amount of free fluid dependently in pelvis.  No mass, adenopathy, free air, or acute osseous findings.  IMPRESSION: Small bowel obstruction in the RIGHT mid abdomen with swirling of the mesentery on particularly the coronal images raising question of internal hernia or volvulus.  Minimal free pelvic fluid but no evidence of perforation or wall thickening.  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  Findings called to Dr. Marlon Pel on 11/06/2012 at 2214 hr.   Electronically Signed   By: Lavonia Dana M.D.   On: 11/06/2013 22:16   Dg Abd Acute W/chest  11/06/2013   CLINICAL DATA:  Periumbilical pain with nausea and vomiting for 1 day  EXAM: ACUTE ABDOMEN SERIES (ABDOMEN 2 VIEW & CHEST 1 VIEW)  COMPARISON:  10/13/2013  FINDINGS: Heart size and vascular pattern are normal. Lungs are clear. No free air.  There are no abnormally dilated  loops of bowel. However, there are abnormal air-fluid levels seen throughout small bowel as well as with and the large bowel. Anastomotic suture line is identified in the left lower quadrant. There are also sutures in the left upper quadrant.  IMPRESSION: Abnormal bowel gas pattern. Possibilities include gastroenteritis as well as bowel obstruction. CT of the abdomen and pelvis recommended.   Electronically Signed   By: Skipper Cliche M.D.   On: 11/06/2013 20:03   Dg  Abd Portable 2v  11/07/2013   CLINICAL DATA:  Followup small bowel obstruction.  EXAM: PORTABLE ABDOMEN - 2 VIEW  COMPARISON:  CT, 11/06/2013  FINDINGS: There is no significant bowel dilation. Contrast is seen throughout most of the colon. Air-fluid levels are noted on the decubitus view. No free air. Bowel anastomosis staples project from the left mid abdomen and in the epigastric region, unchanged. Soft tissues are otherwise unremarkable.  IMPRESSION: 1. There has been significant improvement. Small bowel dilation has essentially resolved although there are still air-fluid levels. This may reflect Newman low grade residual small bowel obstruction or Newman post obstruction, generalized adynamic ileus. No free air.   Electronically Signed   By: Lajean Manes M.D.   On: 11/07/2013 08:41    Anti-infectives: Anti-infectives   None      Assessment/Plan:  Resolving partial SBO  Will try clear liquids.  Potential discharge today is she handles this well.  LOS: 1 day    Alexandria Newman 11/07/2013

## 2013-11-07 NOTE — Discharge Summary (Signed)
Physician Discharge Summary  Patient ID: Alexandria Newman [facility] MRN: [id number] DOB/AGE: November 18, 1950 63 y.o.  Admit date: 11/06/2013 Discharge date: 11/07/2013  Admission Diagnoses:  PSBO/hx of gastric bypass, appendectomy, and hysterectomy Hypertension Hx of hypercholesteremia   Discharge Diagnoses:  Same  Active Problems:   Small bowel obstruction   PROCEDURES: none  Hospital Course: 38 yof who had prior gastric bypass at [place] a number of years ago as well as appy, hysterectomy and what she describes as laparotomies for "tapeworms" as child presents with less than 24 hours of abdominal pain associated with nausea/emesis. Belching, no real flatus today, having bms. No fevers. She has undergone ct and then I was called. She tolerated ct contrast just fine and she has no more n/v. States no real abdominal pain either. She feels almost back to normal self right now.  Her WBC was elevated and came down over night. The following AM she was feeling very good.  Her diet was advanced and she is anxious to go home.  She is doing well on clears, I have advised her to stay on clears today and go to full liquid tomorrow.  She can go up to a soft diet tomorrow if she has normal bowel function and bowel movement. If she has a reoccurance of symptoms she needs to return to the Emergency department.  Condition on dc:  Improved.   Disposition: 01-Home or Self Care     Medication List    ASK your doctor about these medications       ALPRAZolam 0.5 MG tablet  Commonly known as:  XANAX  Take 0.5 mg by mouth at bedtime.     atenolol 50 MG tablet  Commonly known as:  TENORMIN  Take 50 mg by mouth 2 (two) times daily.     B-12 (METHYLCOBALAMIN) SL  Place 1 tablet under the tongue every morning.     buPROPion 150 MG 24 hr tablet  Commonly known as:  WELLBUTRIN XL  Take 150 mg by mouth daily.     citalopram 20 MG tablet  Commonly known as:  CELEXA  Take 20 mg by mouth daily.     lisinopril 10 MG tablet  Commonly known as:  PRINIVIL,ZESTRIL  Take 10 mg by mouth 2 (two) times daily.     simvastatin 20 MG tablet  Commonly known as:  ZOCOR  Take 20 mg by mouth every evening.         SignedEarnstine Regal 11/07/2013, 2:15 PM

## 2013-11-07 NOTE — Progress Notes (Signed)
Discharge note. Reviewed discharge instructions and medications. Pt reports she is ready to go home. Pt's daughter is at the bedside for teaching and will be driving the patient home.

## 2013-11-07 NOTE — Discharge Instructions (Signed)
Small Bowel Obstruction A small bowel obstruction is a blockage (obstruction) of the small intestine (small bowel). The small bowel is a long, slender tube that connects the stomach to the colon. Its job is to absorb nutrients from the fluids and foods you consume into the bloodstream.  CAUSES  There are many causes of intestinal blockage. The most common ones include:  Hernias. This is a more common cause in children than adults.  Inflammatory bowel disease (enteritis and colitis).  Twisting of the bowel (volvulus).  Tumors.  Scar tissue (adhesions) from previous surgery or radiation treatment.  Recent surgery. This may cause an acute small bowel obstruction called an ileus. SYMPTOMS   Abdominal pain. This may be dull cramps or sharp pain. It may occur in one area or may be present in the entire abdomen. Pain can range from mild to severe, depending on the degree of obstruction.  Nausea and vomiting. Vomit may be greenish or yellow bile color.  Distended or swollen stomach. Abdominal bloating is a common symptom.  Constipation.  Lack of passing gas.  Frequent belching.  Diarrhea. This may occur if runny stool is able to leak around the obstruction. DIAGNOSIS  Your caregiver can usually diagnose small bowel obstruction by taking a history, doing a physical exam, and taking X-rays. If the cause is unclear, a CT scan (computerized tomography) of your abdomen and pelvis may be needed. TREATMENT  Treatment of the blockage depends on the cause and how bad the problem is.   Sometimes, the obstruction improves with bed rest and intravenous (IV) fluids.  Resting the bowel is very important. This means following a simple diet. Sometimes, a clear liquid diet may be required for several days.  Sometimes, a small tube (nasogastric tube) is placed into the stomach to decompress the bowel. When the bowel is blocked, it usually swells up like a balloon filled with air and fluids.  Decompression means that the air and fluids are removed by suction through that tube. This can help with pain, discomfort, and nausea. It can also help the obstruction resolve faster.  Surgery may be required if other treatments do not work. Bowel obstruction from a hernia may require early surgery and can be an emergency procedure. Adhesions that cause frequent or severe obstructions may also require surgery. HOME CARE INSTRUCTIONS If your bowel obstruction is only partial or incomplete, you may be allowed to go home.  Get plenty of rest.  Follow your diet as directed by your caregiver.  Only consume clear liquids until your condition improves.  Avoid solid foods as instructed. SEEK IMMEDIATE MEDICAL CARE IF:  You have increased pain or cramping.  You vomit blood.  You have uncontrolled vomiting or nausea.  You cannot drink fluids due to vomiting or pain.  You develop confusion.  You begin feeling very dry or thirsty (dehydrated).  You have severe bloating.  You have chills.  You have a fever.  You feel extremely weak or you faint. MAKE SURE YOU:  Understand these instructions.  Will watch your condition.  Will get help right away if you are not doing well or get worse. Document Released: 06/28/2005 Document Revised: 07/04/2011 Document Reviewed: 06/25/2010 [facility] Patient Information 2015 [place], [place]. This information is not intended to replace advice given to you by your health care provider. Make sure you discuss any questions you have with your health care provider.  Maintain a liquid diet for at least 24 more hours.  If you are having a return  or normal bowel function, you can increase your diet to a soft diet.  After a week you can return to a regular diet.

## 2013-11-12 NOTE — ED Provider Notes (Signed)
Pt seen by me, PA got CT results and did disposition.   Rolland Porter, MD, FACEP   Janice Norrie, MD 11/12/13 [phone]

## 2013-11-20 ENCOUNTER — Ambulatory Visit: Payer: BC Managed Care – PPO | Admitting: Internal Medicine

## 2013-11-26 ENCOUNTER — Ambulatory Visit: Payer: BC Managed Care – PPO | Admitting: Internal Medicine

## 2014-01-03 DIAGNOSIS — Z23 Encounter for immunization: Secondary | ICD-10-CM | POA: Diagnosis not present

## 2014-01-03 DIAGNOSIS — E785 Hyperlipidemia, unspecified: Secondary | ICD-10-CM | POA: Diagnosis not present

## 2014-01-03 DIAGNOSIS — E538 Deficiency of other specified B group vitamins: Secondary | ICD-10-CM | POA: Diagnosis not present

## 2014-01-03 DIAGNOSIS — E8881 Metabolic syndrome: Secondary | ICD-10-CM | POA: Diagnosis not present

## 2014-01-03 DIAGNOSIS — R7989 Other specified abnormal findings of blood chemistry: Secondary | ICD-10-CM | POA: Diagnosis not present

## 2014-01-03 DIAGNOSIS — I10 Essential (primary) hypertension: Secondary | ICD-10-CM | POA: Diagnosis not present

## 2014-01-03 LAB — HEMOGLOBIN A1C: HEMOGLOBIN A1C: 6 % (ref 4.0–6.0)

## 2014-01-04 ENCOUNTER — Inpatient Hospital Stay (HOSPITAL_COMMUNITY)
Admission: EM | Admit: 2014-01-04 | Discharge: 2014-01-12 | DRG: 329 | Disposition: A | Discharge status: Home Health | Payer: Medicare Other | Attending: Surgery | Admitting: Surgery

## 2014-01-04 ENCOUNTER — Encounter (HOSPITAL_COMMUNITY): Payer: Self-pay | Admitting: Emergency Medicine

## 2014-01-04 ENCOUNTER — Emergency Department (HOSPITAL_COMMUNITY): Payer: Medicare Other

## 2014-01-04 DIAGNOSIS — R109 Unspecified abdominal pain: Secondary | ICD-10-CM | POA: Diagnosis not present

## 2014-01-04 DIAGNOSIS — K922 Gastrointestinal hemorrhage, unspecified: Secondary | ICD-10-CM | POA: Diagnosis not present

## 2014-01-04 DIAGNOSIS — K56600 Partial intestinal obstruction, unspecified as to cause: Secondary | ICD-10-CM | POA: Diagnosis present

## 2014-01-04 DIAGNOSIS — I10 Essential (primary) hypertension: Secondary | ICD-10-CM | POA: Diagnosis present

## 2014-01-04 DIAGNOSIS — Z9884 Bariatric surgery status: Secondary | ICD-10-CM

## 2014-01-04 DIAGNOSIS — R188 Other ascites: Secondary | ICD-10-CM | POA: Diagnosis not present

## 2014-01-04 DIAGNOSIS — E7800 Pure hypercholesterolemia, unspecified: Secondary | ICD-10-CM | POA: Diagnosis not present

## 2014-01-04 DIAGNOSIS — K56609 Unspecified intestinal obstruction, unspecified as to partial versus complete obstruction: Secondary | ICD-10-CM | POA: Diagnosis not present

## 2014-01-04 DIAGNOSIS — K55059 Acute (reversible) ischemia of intestine, part and extent unspecified: Secondary | ICD-10-CM | POA: Diagnosis not present

## 2014-01-04 DIAGNOSIS — N736 Female pelvic peritoneal adhesions (postinfective): Secondary | ICD-10-CM | POA: Diagnosis not present

## 2014-01-04 DIAGNOSIS — Z5331 Laparoscopic surgical procedure converted to open procedure: Secondary | ICD-10-CM

## 2014-01-04 DIAGNOSIS — K5669 Other intestinal obstruction: Secondary | ICD-10-CM | POA: Diagnosis not present

## 2014-01-04 DIAGNOSIS — K565 Intestinal adhesions [bands], unspecified as to partial versus complete obstruction: Principal | ICD-10-CM | POA: Diagnosis present

## 2014-01-04 LAB — COMPREHENSIVE METABOLIC PANEL
ALT: 14 U/L (ref 0–35)
ANION GAP: 14 (ref 5–15)
AST: 18 U/L (ref 0–37)
Albumin: 3.8 g/dL (ref 3.5–5.2)
Alkaline Phosphatase: 84 U/L (ref 39–117)
BILIRUBIN TOTAL: 0.4 mg/dL (ref 0.3–1.2)
BUN: 9 mg/dL (ref 6–23)
CHLORIDE: 103 meq/L (ref 96–112)
CO2: 23 mEq/L (ref 19–32)
Calcium: 9.5 mg/dL (ref 8.4–10.5)
Creatinine, Ser: 0.67 mg/dL (ref 0.50–1.10)
GFR calc non Af Amer: >90 mL/min (ref >90)
GLUCOSE: 137 mg/dL — AB (ref 70–99)
Potassium: 4.6 mEq/L (ref 3.7–5.3)
Sodium: 140 mEq/L (ref 137–147)
Total Protein: 7.4 g/dL (ref 6.0–8.3)

## 2014-01-04 LAB — URINALYSIS, ROUTINE W REFLEX MICROSCOPIC
BILIRUBIN URINE: NEGATIVE
GLUCOSE, UA: NEGATIVE mg/dL
HGB URINE DIPSTICK: NEGATIVE
KETONES UR: 40 mg/dL — AB
Leukocytes, UA: NEGATIVE
Nitrite: NEGATIVE
PROTEIN: NEGATIVE mg/dL
Specific Gravity, Urine: 1.02 (ref 1.005–1.030)
Urobilinogen, UA: 1 mg/dL (ref 0.0–1.0)
pH: 8 (ref 5.0–8.0)

## 2014-01-04 LAB — CBC WITH DIFFERENTIAL/PLATELET
Basophils Absolute: 0 10*3/uL (ref 0.0–0.1)
Basophils Relative: 0 % (ref 0–1)
EOS ABS: 0 10*3/uL (ref 0.0–0.7)
Eosinophils Relative: 0 % (ref 0–5)
HCT: 37.9 % (ref 36.0–46.0)
HEMOGLOBIN: 12.8 g/dL (ref 12.0–15.0)
LYMPHS ABS: 1 10*3/uL (ref 0.7–4.0)
Lymphocytes Relative: 7 % — ABNORMAL LOW (ref 12–46)
MCH: 29.2 pg (ref 26.0–34.0)
MCHC: 33.8 g/dL (ref 30.0–36.0)
MCV: 86.3 fL (ref 78.0–100.0)
MONO ABS: 0.5 10*3/uL (ref 0.1–1.0)
MONOS PCT: 3 % (ref 3–12)
NEUTROS ABS: 12.3 10*3/uL — AB (ref 1.7–7.7)
NEUTROS PCT: 90 % — AB (ref 43–77)
Platelets: 288 10*3/uL (ref 150–400)
RBC: 4.39 MIL/uL (ref 3.87–5.11)
RDW: 13 % (ref 11.5–15.5)
WBC: 13.8 10*3/uL — ABNORMAL HIGH (ref 4.0–10.5)

## 2014-01-04 LAB — LIPASE, BLOOD: Lipase: 58 U/L (ref 11–59)

## 2014-01-04 LAB — LACTIC ACID, PLASMA: Lactic Acid, Venous: 1.9 mmol/L (ref 0.5–2.2)

## 2014-01-04 MED ORDER — ONDANSETRON HCL 4 MG/2ML IJ SOLN
4.0000 mg | Freq: Once | INTRAMUSCULAR | Status: AC
  Administered 2014-01-04: 4 mg via INTRAVENOUS
  Filled 2014-01-04: qty 2

## 2014-01-04 MED ORDER — POTASSIUM CHLORIDE IN NACL 20-0.9 MEQ/L-% IV SOLN
INTRAVENOUS | Status: DC
  Administered 2014-01-05 – 2014-01-06 (×3): via INTRAVENOUS
  Filled 2014-01-04 (×8): qty 1000

## 2014-01-04 MED ORDER — HYDROMORPHONE HCL PF 1 MG/ML IJ SOLN: 0.5000 mg | Freq: Once | INTRAMUSCULAR | Status: DC

## 2014-01-04 MED ORDER — ENOXAPARIN SODIUM 40 MG/0.4ML ~~LOC~~ SOLN
40.0000 mg | SUBCUTANEOUS | Status: DC
  Administered 2014-01-05 – 2014-01-11 (×7): 40 mg via SUBCUTANEOUS
  Filled 2014-01-04 (×13): qty 0.4

## 2014-01-04 MED ORDER — LISINOPRIL 20 MG PO TABS
20.0000 mg | ORAL_TABLET | Freq: Every day | ORAL | Status: DC
  Administered 2014-01-05 – 2014-01-12 (×8): 20 mg via ORAL
  Filled 2014-01-04 (×9): qty 1

## 2014-01-04 MED ORDER — BUPROPION HCL ER (XL) 150 MG PO TB24
150.0000 mg | ORAL_TABLET | Freq: Every day | ORAL | Status: DC
  Administered 2014-01-05 – 2014-01-12 (×8): 150 mg via ORAL
  Filled 2014-01-04 (×9): qty 1

## 2014-01-04 MED ORDER — SODIUM CHLORIDE 0.9 % IV BOLUS (SEPSIS)
1000.0000 mL | Freq: Once | INTRAVENOUS | Status: AC
  Administered 2014-01-04: 1000 mL via INTRAVENOUS

## 2014-01-04 MED ORDER — HYDROMORPHONE HCL PF 1 MG/ML IJ SOLN
1.0000 mg | INTRAMUSCULAR | Status: DC | PRN
  Administered 2014-01-04 – 2014-01-05 (×5): 1 mg via INTRAVENOUS
  Filled 2014-01-04 (×5): qty 1

## 2014-01-04 MED ORDER — ONDANSETRON HCL 4 MG/2ML IJ SOLN
4.0000 mg | Freq: Four times a day (QID) | INTRAMUSCULAR | Status: DC | PRN
  Administered 2014-01-04: 4 mg via INTRAVENOUS
  Filled 2014-01-04 (×2): qty 2

## 2014-01-04 MED ORDER — ALPRAZOLAM 0.5 MG PO TABS
0.5000 mg | ORAL_TABLET | Freq: Every day | ORAL | Status: DC
  Administered 2014-01-07 – 2014-01-11 (×4): 0.5 mg via ORAL
  Filled 2014-01-04 (×5): qty 1

## 2014-01-04 MED ORDER — ATENOLOL 50 MG PO TABS
50.0000 mg | ORAL_TABLET | Freq: Every day | ORAL | Status: DC
  Administered 2014-01-05 – 2014-01-12 (×8): 50 mg via ORAL
  Filled 2014-01-04 (×10): qty 1

## 2014-01-04 MED ORDER — IOHEXOL 300 MG/ML  SOLN
100.0000 mL | Freq: Once | INTRAMUSCULAR | Status: AC | PRN
  Administered 2014-01-04: 100 mL via INTRAVENOUS

## 2014-01-04 MED ORDER — HYDROMORPHONE HCL PF 1 MG/ML IJ SOLN
0.5000 mg | INTRAMUSCULAR | Status: DC | PRN
  Administered 2014-01-04 (×2): 0.5 mg via INTRAVENOUS
  Filled 2014-01-04 (×2): qty 1

## 2014-01-04 MED ORDER — PANTOPRAZOLE SODIUM 40 MG IV SOLR
40.0000 mg | Freq: Every day | INTRAVENOUS | Status: DC
  Administered 2014-01-04 – 2014-01-09 (×6): 40 mg via INTRAVENOUS
  Filled 2014-01-04 (×11): qty 40

## 2014-01-04 NOTE — ED Provider Notes (Signed)
CSN: [id number]     Arrival date & time 01/04/14  1611 History   First MD Initiated Contact with Patient 01/04/14 1703     Chief Complaint  Patient presents with  . Numbness    B/L arms  . Back Pain  . Abdominal Pain     (Consider location/radiation/quality/duration/timing/severity/associated sxs/prior Treatment) Patient is a 63 y.o. female presenting with abdominal pain.  Abdominal Pain Pain location:  Generalized Pain quality: sharp   Pain radiates to:  Does not radiate Pain severity:  Severe Onset quality:  Gradual Duration:  1 day Timing:  Constant Progression:  Worsening Chronicity:  Recurrent Context: previous surgery (gastric bypass) and recent illness (prior partial SBO 1 month ago with nonsurgical management)   Relieved by:  Nothing Worsened by:  Nothing tried Ineffective treatments:  None tried Associated symptoms: constipation (last BM yesterday normal) and nausea   Associated symptoms: no anorexia, no chest pain, no chills, no cough, no diarrhea, no dysuria, no fever, no flatus, no shortness of breath, no sore throat, no vaginal bleeding, no vaginal discharge and no vomiting     Past Medical History  Diagnosis Date  . Hypertension   . Hypercholesteremia    Past Surgical History  Procedure Laterality Date  . Gastric bypass  2009  . Appendectomy    . Abdominal hysterectomy     No family history on file. History  Substance Use Topics  . Smoking status: Never Smoker   . Smokeless tobacco: Not on file  . Alcohol Use: No   OB History   Grav Para Term Preterm Abortions TAB SAB Ect Mult Living            2     Review of Systems  Constitutional: Negative for fever and chills.  HENT: Negative for congestion, rhinorrhea and sore throat.   Eyes: Negative for photophobia and visual disturbance.  Respiratory: Negative for cough and shortness of breath.   Cardiovascular: Negative for chest pain and leg swelling.  Gastrointestinal: Positive for nausea,  abdominal pain and constipation (last BM yesterday normal). Negative for vomiting, diarrhea, anorexia and flatus.  Endocrine: Negative for polyphagia and polyuria.  Genitourinary: Negative for dysuria, flank pain, vaginal bleeding, vaginal discharge and enuresis.  Musculoskeletal: Negative for back pain and gait problem.  Skin: Negative for color change and rash.  Neurological: Negative for dizziness, syncope, light-headedness and numbness.  Hematological: Negative for adenopathy. Does not bruise/bleed easily.  All other systems reviewed and are negative.     Allergies  Review of patient's allergies indicates no known allergies.  Home Medications   Prior to Admission medications   Medication Sig Start Date End Date Taking? Authorizing Provider  acetaminophen (TYLENOL) 325 MG tablet Take 2 tablets (650 mg total) by mouth every 6 (six) hours as needed for mild pain (or Temp > 100). 11/07/13  Yes Earnstine Regal, PA-C  ALPRAZolam Duanne Moron) 0.5 MG tablet Take 0.5 mg by mouth at bedtime.  10/01/13  Yes Historical Provider, MD  atenolol (TENORMIN) 50 MG tablet Take 50 mg by mouth daily.  10/01/13  Yes Historical Provider, MD  B-12, METHYLCOBALAMIN, SL Place 2 tablets under the tongue every morning.    Yes Historical Provider, MD  buPROPion (WELLBUTRIN XL) 150 MG 24 hr tablet Take 150 mg by mouth daily.  09/17/13  Yes Historical Provider, MD  citalopram (CELEXA) 20 MG tablet Take 20 mg by mouth daily.  08/07/13  Yes Historical Provider, MD  lisinopril (PRINIVIL,ZESTRIL) 20 MG tablet Take 20 mg  by mouth daily.   Yes Historical Provider, MD  simvastatin (ZOCOR) 20 MG tablet Take 20 mg by mouth every evening.  09/28/13  Yes Historical Provider, MD   BP 96/44  Pulse 80  Temp(Src) 98.8 F (37.1 C) (Oral)  Resp 17  Ht 5\' 5"  (1.651 m)  Wt 178 lb 5 oz (80.882 kg)  BMI 29.67 kg/m2  SpO2 92% Physical Exam  Vitals reviewed. Constitutional: She is oriented to person, place, and time. She appears  well-developed and well-nourished.  HENT:  Head: Normocephalic and atraumatic.  Right Ear: External ear normal.  Left Ear: External ear normal.  Eyes: Conjunctivae and EOM are normal. Pupils are equal, round, and reactive to light.  Neck: Normal range of motion. Neck supple.  Cardiovascular: Normal rate, regular rhythm, normal heart sounds and intact distal pulses.   Pulmonary/Chest: Effort normal and breath sounds normal.  Abdominal: Soft. Bowel sounds are normal. There is tenderness in the right lower quadrant and left lower quadrant.  Musculoskeletal: Normal range of motion.  Neurological: She is alert and oriented to person, place, and time.  Skin: Skin is warm and dry.    ED Course  Procedures (including critical care time) Labs Review Labs Reviewed  CBC WITH DIFFERENTIAL - Abnormal; Notable for the following:    WBC 13.8 (*)    Neutrophils Relative % 90 (*)    Neutro Abs 12.3 (*)    Lymphocytes Relative 7 (*)    All other components within normal limits  COMPREHENSIVE METABOLIC PANEL - Abnormal; Notable for the following:    Glucose, Bld 137 (*)    All other components within normal limits  URINALYSIS, ROUTINE W REFLEX MICROSCOPIC - Abnormal; Notable for the following:    Color, Urine AMBER (*)    Ketones, ur 40 (*)    All other components within normal limits  SURGICAL PCR SCREEN  LIPASE, BLOOD  LACTIC ACID, PLASMA  CBC    Imaging Review Ct Abdomen Pelvis W Contrast  01/04/2014   CLINICAL DATA:  Lower abdominal pain.  Back pain.  EXAM: CT ABDOMEN AND PELVIS WITH CONTRAST  TECHNIQUE: Multidetector CT imaging of the abdomen and pelvis was performed using the standard protocol following bolus administration of intravenous contrast.  CONTRAST:  122mL OMNIPAQUE IOHEXOL 300 MG/ML  SOLN  COMPARISON:  CT of the abdomen and pelvis 11/06/2013.  FINDINGS: Lung Bases: Mild scarring in the left lower lobe is unchanged. Mild cardiomegaly.  Abdomen/Pelvis: The proximal and mid small  bowel are decompressed, however, the ileum appears dilated up to 4.4 cm in diameter. Additionally, in the region of the distal and terminal ileum there is a severe rotation of the mesentery and bowel, best appreciated on axial images 41-56 of series 2, and coronal images 46-81 of series 5, concerning for early or partial small bowel obstruction related to either adhesions or internal hernia. The colon is relatively decompressed. There is mild haziness in the associated mesentery, and multiple borderline enlarged and minimally enlarged mesenteric lymph nodes in the right lower quadrant measuring up to 11 mm in short axis.  Postoperative changes of gastric bypass procedure are noted. The appearance of the liver, gallbladder, pancreas, spleen, bilateral adrenal glands and bilateral kidneys is unremarkable. Atherosclerosis throughout the abdominal and pelvic vasculature, without evidence of aneurysm or dissection. Small volume of ascites, most notably within the low anatomic pelvis. No pneumoperitoneum. Status post hysterectomy. Ovaries are not confidently identified may be surgically absent or atrophic. Urinary bladder is nearly completely decompressed.  Musculoskeletal: There are no aggressive appearing lytic or blastic lesions noted in the visualized portions of the skeleton.  IMPRESSION: 1. Findings are concerning for possible early or partial small bowel obstruction related to either adhesions or internal hernia in the right lower quadrant affecting the distal and terminal ileum where there is marked rotation of the mesentery, with some associated haziness in the mesentery which may indicate edema and/or inflammation. There are also some reactive lymph nodes in this region, and there is a small volume of ascites. No pneumoperitoneum at this time. 2. Status post gastric bypass procedure. 3. Additional incidental findings, as above.   Electronically Signed   By: Vinnie Langton M.D.   On: 01/04/2014 19:45     EKG  Interpretation None      MDM   Final diagnoses:  Partial small bowel obstruction    63 y.o. female  with pertinent PMH of gastric bypass, prior appendectomy presents with recurrent abd pain similar to prior partial SBO within recent past.  No fevers, however pt has been nauseous.  Last BM yesterday.  CT scan obtained as pt has similar symptoms to prior SBO.   Labs and imaging as above reviewed. CT scan consistent with prior SBO.  Consulted surgery for admission.    1. Partial small bowel obstruction         Debby Freiberg, MD 01/05/14 1158

## 2014-01-04 NOTE — ED Notes (Signed)
Transporting patient to new room assignment. 

## 2014-01-04 NOTE — H&P (Signed)
Alexandria Newman is an 63 y.o. female.   Chief Complaint: abdominal pain HPI: this is a 63 year old female who was admitted here in July with a partial small bowel obstruction. She has an extensive past surgical history including a gastric bypass at [facility] several years ago. When she presented in July, she had CAT scan findings worrisome for a possible internal hernia. She, however, was pain free with a benign abdomen several hours after presenting to the hospital. She did well until today when she started having moderate to severe abdominal pain. Her last bowel movement was yesterday. She has had no nausea or vomiting. The pain is cramping in nature. It is now improving. She is otherwise without complaints.  Past Medical History  Diagnosis Date  . Hypertension   . Hypercholesteremia     Past Surgical History  Procedure Laterality Date  . Gastric bypass  2009  . Appendectomy    . Abdominal hysterectomy      No family history on file. Social History:  reports that she has never smoked. She does not have any smokeless tobacco history on file. She reports that she does not drink alcohol or use illicit drugs.  Allergies: No Known Allergies   (Not in a hospital admission)  Results for orders placed during the hospital encounter of 01/04/14 (from the past 48 hour(s))  CBC WITH DIFFERENTIAL     Status: Abnormal   Collection Time    01/04/14  5:49 PM      Result Value Ref Range   WBC 13.8 (*) 4.0 - 10.5 K/uL   RBC 4.39  3.87 - 5.11 MIL/uL   Hemoglobin 12.8  12.0 - 15.0 g/dL   HCT 37.9  36.0 - 46.0 %   MCV 86.3  78.0 - 100.0 fL   MCH 29.2  26.0 - 34.0 pg   MCHC 33.8  30.0 - 36.0 g/dL   RDW 13.0  11.5 - 15.5 %   Platelets 288  150 - 400 K/uL   Neutrophils Relative % 90 (*) 43 - 77 %   Neutro Abs 12.3 (*) 1.7 - 7.7 K/uL   Lymphocytes Relative 7 (*) 12 - 46 %   Lymphs Abs 1.0  0.7 - 4.0 K/uL   Monocytes Relative 3  3 - 12 %   Monocytes Absolute 0.5  0.1 - 1.0 K/uL    Eosinophils Relative 0  0 - 5 %   Eosinophils Absolute 0.0  0.0 - 0.7 K/uL   Basophils Relative 0  0 - 1 %   Basophils Absolute 0.0  0.0 - 0.1 K/uL  COMPREHENSIVE METABOLIC PANEL     Status: Abnormal   Collection Time    01/04/14  5:49 PM      Result Value Ref Range   Sodium 140  137 - 147 mEq/L   Potassium 4.6  3.7 - 5.3 mEq/L   Chloride 103  96 - 112 mEq/L   CO2 23  19 - 32 mEq/L   Glucose, Bld 137 (*) 70 - 99 mg/dL   BUN 9  6 - 23 mg/dL   Creatinine, Ser 0.67  0.50 - 1.10 mg/dL   Calcium 9.5  8.4 - 10.5 mg/dL   Total Protein 7.4  6.0 - 8.3 g/dL   Albumin 3.8  3.5 - 5.2 g/dL   AST 18  0 - 37 U/L   ALT 14  0 - 35 U/L   Alkaline Phosphatase 84  39 - 117 U/L   Total Bilirubin 0.4  0.3 - 1.2 mg/dL   GFR calc non Af Amer >90  >90 mL/min   GFR calc Af Amer >90  >90 mL/min   Comment: (NOTE)     The eGFR has been calculated using the CKD EPI equation.     This calculation has not been validated in all clinical situations.     eGFR's persistently <90 mL/min signify possible Chronic Kidney     Disease.   Anion gap 14  5 - 15  LIPASE, BLOOD     Status: None   Collection Time    01/04/14  5:49 PM      Result Value Ref Range   Lipase 58  11 - 59 U/L  LACTIC ACID, PLASMA     Status: None   Collection Time    01/04/14  5:49 PM      Result Value Ref Range   Lactic Acid, Venous 1.9  0.5 - 2.2 mmol/L  URINALYSIS, ROUTINE W REFLEX MICROSCOPIC     Status: Abnormal   Collection Time    01/04/14  7:00 PM      Result Value Ref Range   Color, Urine AMBER (*) YELLOW   Comment: BIOCHEMICALS MAY BE AFFECTED BY COLOR   APPearance CLEAR  CLEAR   Specific Gravity, Urine 1.020  1.005 - 1.030   pH 8.0  5.0 - 8.0   Glucose, UA NEGATIVE  NEGATIVE mg/dL   Hgb urine dipstick NEGATIVE  NEGATIVE   Bilirubin Urine NEGATIVE  NEGATIVE   Ketones, ur 40 (*) NEGATIVE mg/dL   Protein, ur NEGATIVE  NEGATIVE mg/dL   Urobilinogen, UA 1.0  0.0 - 1.0 mg/dL   Nitrite NEGATIVE  NEGATIVE   Leukocytes, UA  NEGATIVE  NEGATIVE   Comment: MICROSCOPIC NOT DONE ON URINES WITH NEGATIVE PROTEIN, BLOOD, LEUKOCYTES, NITRITE, OR GLUCOSE <1000 mg/dL.   Ct Abdomen Pelvis W Contrast  01/04/2014   CLINICAL DATA:  Lower abdominal pain.  Back pain.  EXAM: CT ABDOMEN AND PELVIS WITH CONTRAST  TECHNIQUE: Multidetector CT imaging of the abdomen and pelvis was performed using the standard protocol following bolus administration of intravenous contrast.  CONTRAST:  160mL OMNIPAQUE IOHEXOL 300 MG/ML  SOLN  COMPARISON:  CT of the abdomen and pelvis 11/06/2013.  FINDINGS: Lung Bases: Mild scarring in the left lower lobe is unchanged. Mild cardiomegaly.  Abdomen/Pelvis: The proximal and mid small bowel are decompressed, however, the ileum appears dilated up to 4.4 cm in diameter. Additionally, in the region of the distal and terminal ileum there is a severe rotation of the mesentery and bowel, best appreciated on axial images 41-56 of series 2, and coronal images 46-81 of series 5, concerning for early or partial small bowel obstruction related to either adhesions or internal hernia. The colon is relatively decompressed. There is mild haziness in the associated mesentery, and multiple borderline enlarged and minimally enlarged mesenteric lymph nodes in the right lower quadrant measuring up to 11 mm in short axis.  Postoperative changes of gastric bypass procedure are noted. The appearance of the liver, gallbladder, pancreas, spleen, bilateral adrenal glands and bilateral kidneys is unremarkable. Atherosclerosis throughout the abdominal and pelvic vasculature, without evidence of aneurysm or dissection. Small volume of ascites, most notably within the low anatomic pelvis. No pneumoperitoneum. Status post hysterectomy. Ovaries are not confidently identified may be surgically absent or atrophic. Urinary bladder is nearly completely decompressed.  Musculoskeletal: There are no aggressive appearing lytic or blastic lesions noted in the  visualized portions of the skeleton.  IMPRESSION: 1. Findings are concerning for possible early or partial small bowel obstruction related to either adhesions or internal hernia in the right lower quadrant affecting the distal and terminal ileum where there is marked rotation of the mesentery, with some associated haziness in the mesentery which may indicate edema and/or inflammation. There are also some reactive lymph nodes in this region, and there is a small volume of ascites. No pneumoperitoneum at this time. 2. Status post gastric bypass procedure. 3. Additional incidental findings, as above.   Electronically Signed   By: Vinnie Langton M.D.   On: 01/04/2014 19:45    Review of Systems  All other systems reviewed and are negative.   Blood pressure 113/54, pulse 68, temperature 99.2 F (37.3 C), temperature source Oral, resp. rate 17, height $RemoveBe'5\' 5"'ScevWvjEX$  (1.651 m), weight 178 lb 5 oz (80.882 kg), SpO2 93.00%. Physical Exam  Constitutional: She is oriented to person, place, and time. She appears well-developed and well-nourished. No distress.  HENT:  Head: Normocephalic and atraumatic.  Right Ear: External ear normal.  Left Ear: External ear normal.  Nose: Nose normal.  Mouth/Throat: Oropharynx is clear and moist. No oropharyngeal exudate.  Eyes: Conjunctivae are normal. Pupils are equal, round, and reactive to light.  Neck: Normal range of motion. Neck supple. No tracheal deviation present.  Cardiovascular: Normal rate, normal heart sounds and intact distal pulses.   No murmur heard. Respiratory: Effort normal and breath sounds normal. No respiratory distress. She has no wheezes.  GI: Soft. She exhibits no distension. There is tenderness. There is guarding.  Her abdomen is nondistended. There is very mild central tenderness with minimal guarding  Musculoskeletal: Normal range of motion. She exhibits no edema and no tenderness.  Lymphadenopathy:    She has no cervical adenopathy.   Neurological: She is alert and oriented to person, place, and time.  Skin: Skin is warm and dry. No rash noted. She is not diaphoretic. No erythema.  Psychiatric: Her behavior is normal. Judgment normal.     Assessment/Plan Partial small bowel obstruction with possible internal hernia  Plan will be to admit her to the hospital for bowel rest and IV rehydration. I do not believe she needs emergent surgery tonight. However, I do believe she will probably eventually need surgery to prevent this from happening again.  She agrees to the admission.  Remas Sobel A 01/04/2014, 10:02 PM

## 2014-01-04 NOTE — ED Notes (Signed)
Notified CT pt finished drinking contrast. Waiting on lab work

## 2014-01-04 NOTE — ED Notes (Signed)
Attempted to call report x 1  

## 2014-01-04 NOTE — ED Notes (Signed)
Pt c/o abdominal pain onset last night. Pt also c/o mid back pain, numbness and tingling to B/L arms with sweating. Pt c/o nausea. Pt skin pale and clammy. Pt with shortness of breath.

## 2014-01-05 ENCOUNTER — Encounter (HOSPITAL_COMMUNITY): Payer: Medicare Other | Admitting: Anesthesiology

## 2014-01-05 ENCOUNTER — Observation Stay (HOSPITAL_COMMUNITY): Payer: Medicare Other | Admitting: Anesthesiology

## 2014-01-05 ENCOUNTER — Encounter (HOSPITAL_COMMUNITY): Admission: EM | Disposition: A | Discharge status: Home Health | Payer: Self-pay | Source: Home / Self Care

## 2014-01-05 DIAGNOSIS — K565 Intestinal adhesions [bands], unspecified as to partial versus complete obstruction: Secondary | ICD-10-CM | POA: Diagnosis not present

## 2014-01-05 DIAGNOSIS — N736 Female pelvic peritoneal adhesions (postinfective): Secondary | ICD-10-CM | POA: Diagnosis not present

## 2014-01-05 DIAGNOSIS — R109 Unspecified abdominal pain: Secondary | ICD-10-CM | POA: Diagnosis present

## 2014-01-05 DIAGNOSIS — K922 Gastrointestinal hemorrhage, unspecified: Secondary | ICD-10-CM | POA: Diagnosis not present

## 2014-01-05 DIAGNOSIS — Z5331 Laparoscopic surgical procedure converted to open procedure: Secondary | ICD-10-CM | POA: Diagnosis not present

## 2014-01-05 DIAGNOSIS — Z9884 Bariatric surgery status: Secondary | ICD-10-CM | POA: Diagnosis not present

## 2014-01-05 DIAGNOSIS — E7800 Pure hypercholesterolemia, unspecified: Secondary | ICD-10-CM | POA: Diagnosis not present

## 2014-01-05 DIAGNOSIS — K56609 Unspecified intestinal obstruction, unspecified as to partial versus complete obstruction: Secondary | ICD-10-CM | POA: Diagnosis not present

## 2014-01-05 DIAGNOSIS — K55059 Acute (reversible) ischemia of intestine, part and extent unspecified: Secondary | ICD-10-CM | POA: Diagnosis not present

## 2014-01-05 DIAGNOSIS — I10 Essential (primary) hypertension: Secondary | ICD-10-CM | POA: Diagnosis not present

## 2014-01-05 HISTORY — PX: LAPAROSCOPY: SHX197

## 2014-01-05 LAB — CBC
HEMATOCRIT: 32.6 % — AB (ref 36.0–46.0)
Hemoglobin: 10.4 g/dL — ABNORMAL LOW (ref 12.0–15.0)
MCH: 29.1 pg (ref 26.0–34.0)
MCHC: 31.9 g/dL (ref 30.0–36.0)
MCV: 91.1 fL (ref 78.0–100.0)
Platelets: 212 10*3/uL (ref 150–400)
RBC: 3.58 MIL/uL — AB (ref 3.87–5.11)
RDW: 13.5 % (ref 11.5–15.5)
WBC: 7.9 10*3/uL (ref 4.0–10.5)

## 2014-01-05 LAB — SURGICAL PCR SCREEN
MRSA, PCR: NEGATIVE
Staphylococcus aureus: NEGATIVE

## 2014-01-05 SURGERY — LAPAROSCOPY, DIAGNOSTIC
Anesthesia: General | Site: Abdomen

## 2014-01-05 MED ORDER — PHENOL 1.4 % MT LIQD
1.0000 | OROMUCOSAL | Status: DC | PRN
  Filled 2014-01-05: qty 177

## 2014-01-05 MED ORDER — EPHEDRINE SULFATE 50 MG/ML IJ SOLN
INTRAMUSCULAR | Status: AC
  Filled 2014-01-05: qty 1

## 2014-01-05 MED ORDER — HYDROMORPHONE HCL PF 1 MG/ML IJ SOLN
0.2500 mg | INTRAMUSCULAR | Status: DC | PRN
  Administered 2014-01-05: 0.5 mg via INTRAVENOUS

## 2014-01-05 MED ORDER — GLYCOPYRROLATE 0.2 MG/ML IJ SOLN
INTRAMUSCULAR | Status: DC | PRN
  Administered 2014-01-05: .8 mg via INTRAVENOUS

## 2014-01-05 MED ORDER — SODIUM CHLORIDE 0.9 % IJ SOLN
INTRAMUSCULAR | Status: AC
  Filled 2014-01-05: qty 10

## 2014-01-05 MED ORDER — METRONIDAZOLE IN NACL 5-0.79 MG/ML-% IV SOLN
500.0000 mg | Freq: Three times a day (TID) | INTRAVENOUS | Status: AC
  Administered 2014-01-05 – 2014-01-06 (×3): 500 mg via INTRAVENOUS
  Filled 2014-01-05 (×4): qty 100

## 2014-01-05 MED ORDER — MIDAZOLAM HCL 5 MG/5ML IJ SOLN
INTRAMUSCULAR | Status: DC | PRN
  Administered 2014-01-05: 1 mg via INTRAVENOUS

## 2014-01-05 MED ORDER — ONDANSETRON HCL 4 MG/2ML IJ SOLN: 4.0000 mg | Freq: Four times a day (QID) | INTRAMUSCULAR | Status: DC | PRN

## 2014-01-05 MED ORDER — CHLORHEXIDINE GLUCONATE 0.12 % MT SOLN
15.0000 mL | Freq: Two times a day (BID) | OROMUCOSAL | Status: DC
  Administered 2014-01-05 – 2014-01-10 (×11): 15 mL via OROMUCOSAL
  Filled 2014-01-05 (×10): qty 15

## 2014-01-05 MED ORDER — ONDANSETRON HCL 4 MG/2ML IJ SOLN
INTRAMUSCULAR | Status: DC | PRN
  Administered 2014-01-05: 4 mg via INTRAVENOUS

## 2014-01-05 MED ORDER — BUPIVACAINE-EPINEPHRINE (PF) 0.25% -1:200000 IJ SOLN
INTRAMUSCULAR | Status: AC
  Filled 2014-01-05: qty 30

## 2014-01-05 MED ORDER — 0.9 % SODIUM CHLORIDE (POUR BTL) OPTIME
TOPICAL | Status: DC | PRN
  Administered 2014-01-05: 2000 mL

## 2014-01-05 MED ORDER — ALBUMIN HUMAN 5 % IV SOLN
INTRAVENOUS | Status: DC | PRN
  Administered 2014-01-05: 12:00:00 via INTRAVENOUS

## 2014-01-05 MED ORDER — SUCCINYLCHOLINE CHLORIDE 20 MG/ML IJ SOLN
INTRAMUSCULAR | Status: AC
  Filled 2014-01-05: qty 1

## 2014-01-05 MED ORDER — OXYCODONE HCL 5 MG PO TABS: 5.0000 mg | ORAL_TABLET | Freq: Once | ORAL | Status: DC | PRN

## 2014-01-05 MED ORDER — NEOSTIGMINE METHYLSULFATE 10 MG/10ML IV SOLN
INTRAVENOUS | Status: AC
  Filled 2014-01-05: qty 1

## 2014-01-05 MED ORDER — CEFAZOLIN SODIUM-DEXTROSE 2-3 GM-% IV SOLR
INTRAVENOUS | Status: DC | PRN
  Administered 2014-01-05: 2 g via INTRAVENOUS

## 2014-01-05 MED ORDER — DIPHENHYDRAMINE HCL 50 MG/ML IJ SOLN
INTRAMUSCULAR | Status: AC
  Filled 2014-01-05: qty 1

## 2014-01-05 MED ORDER — FENTANYL CITRATE 0.05 MG/ML IJ SOLN
INTRAMUSCULAR | Status: DC | PRN
  Administered 2014-01-05 (×2): 50 ug via INTRAVENOUS
  Administered 2014-01-05: 100 ug via INTRAVENOUS

## 2014-01-05 MED ORDER — PROPOFOL 10 MG/ML IV BOLUS
INTRAVENOUS | Status: DC | PRN
  Administered 2014-01-05: 150 mg via INTRAVENOUS

## 2014-01-05 MED ORDER — KETOROLAC TROMETHAMINE 30 MG/ML IJ SOLN
INTRAMUSCULAR | Status: AC
  Filled 2014-01-05: qty 1

## 2014-01-05 MED ORDER — ROCURONIUM BROMIDE 100 MG/10ML IV SOLN
INTRAVENOUS | Status: DC | PRN
  Administered 2014-01-05: 15 mg via INTRAVENOUS
  Administered 2014-01-05: 35 mg via INTRAVENOUS

## 2014-01-05 MED ORDER — NEOSTIGMINE METHYLSULFATE 10 MG/10ML IV SOLN
INTRAVENOUS | Status: DC | PRN
  Administered 2014-01-05: 5 mg via INTRAVENOUS

## 2014-01-05 MED ORDER — DIPHENHYDRAMINE HCL 50 MG/ML IJ SOLN: 12.5000 mg | Freq: Four times a day (QID) | INTRAMUSCULAR | Status: DC | PRN

## 2014-01-05 MED ORDER — HYDROMORPHONE HCL PF 1 MG/ML IJ SOLN
INTRAMUSCULAR | Status: AC
  Filled 2014-01-05: qty 1

## 2014-01-05 MED ORDER — CETYLPYRIDINIUM CHLORIDE 0.05 % MT LIQD
7.0000 mL | Freq: Two times a day (BID) | OROMUCOSAL | Status: DC
  Administered 2014-01-05 – 2014-01-11 (×7): 7 mL via OROMUCOSAL

## 2014-01-05 MED ORDER — PHENYLEPHRINE 40 MCG/ML (10ML) SYRINGE FOR IV PUSH (FOR BLOOD PRESSURE SUPPORT)
PREFILLED_SYRINGE | INTRAVENOUS | Status: AC
  Filled 2014-01-05: qty 10

## 2014-01-05 MED ORDER — GLYCOPYRROLATE 0.2 MG/ML IJ SOLN
INTRAMUSCULAR | Status: AC
  Filled 2014-01-05: qty 4

## 2014-01-05 MED ORDER — KETOROLAC TROMETHAMINE 30 MG/ML IJ SOLN
30.0000 mg | Freq: Once | INTRAMUSCULAR | Status: AC
  Administered 2014-01-05: 30 mg via INTRAVENOUS

## 2014-01-05 MED ORDER — SODIUM CHLORIDE 0.9 % IJ SOLN
9.0000 mL | INTRAMUSCULAR | Status: DC | PRN
Start: 2014-01-05 — End: 2014-01-07

## 2014-01-05 MED ORDER — EPHEDRINE SULFATE 50 MG/ML IJ SOLN
INTRAMUSCULAR | Status: DC | PRN
  Administered 2014-01-05: 10 mg via INTRAVENOUS

## 2014-01-05 MED ORDER — SUCCINYLCHOLINE CHLORIDE 20 MG/ML IJ SOLN
INTRAMUSCULAR | Status: DC | PRN
  Administered 2014-01-05: 120 mg via INTRAVENOUS

## 2014-01-05 MED ORDER — LIDOCAINE HCL (CARDIAC) 20 MG/ML IV SOLN
INTRAVENOUS | Status: DC | PRN
  Administered 2014-01-05: 60 mg via INTRAVENOUS

## 2014-01-05 MED ORDER — NALOXONE HCL 0.4 MG/ML IJ SOLN: 0.4000 mg | INTRAMUSCULAR | Status: DC | PRN

## 2014-01-05 MED ORDER — ROCURONIUM BROMIDE 50 MG/5ML IV SOLN
INTRAVENOUS | Status: AC
  Filled 2014-01-05: qty 1

## 2014-01-05 MED ORDER — ONDANSETRON HCL 4 MG/2ML IJ SOLN
INTRAMUSCULAR | Status: AC
  Filled 2014-01-05: qty 2

## 2014-01-05 MED ORDER — MENTHOL 3 MG MT LOZG
1.0000 | LOZENGE | OROMUCOSAL | Status: DC | PRN
  Filled 2014-01-05: qty 9

## 2014-01-05 MED ORDER — LACTATED RINGERS IV SOLN
INTRAVENOUS | Status: DC | PRN
  Administered 2014-01-05 (×2): via INTRAVENOUS

## 2014-01-05 MED ORDER — PHENYLEPHRINE HCL 10 MG/ML IJ SOLN
INTRAMUSCULAR | Status: DC | PRN
  Administered 2014-01-05 (×2): 80 ug via INTRAVENOUS

## 2014-01-05 MED ORDER — MIDAZOLAM HCL 2 MG/2ML IJ SOLN
INTRAMUSCULAR | Status: AC
  Filled 2014-01-05: qty 2

## 2014-01-05 MED ORDER — DIPHENHYDRAMINE HCL 12.5 MG/5ML PO ELIX
12.5000 mg | ORAL_SOLUTION | Freq: Four times a day (QID) | ORAL | Status: DC | PRN
Start: 2014-01-05 — End: 2014-01-07

## 2014-01-05 MED ORDER — DIPHENHYDRAMINE HCL 50 MG/ML IJ SOLN
25.0000 mg | INTRAMUSCULAR | Status: DC | PRN
  Administered 2014-01-05: 25 mg via INTRAVENOUS

## 2014-01-05 MED ORDER — ONDANSETRON HCL 4 MG/2ML IJ SOLN
4.0000 mg | Freq: Once | INTRAMUSCULAR | Status: AC
  Administered 2014-01-05: 4 mg via INTRAVENOUS

## 2014-01-05 MED ORDER — OXYCODONE HCL 5 MG/5ML PO SOLN: 5.0000 mg | Freq: Once | ORAL | Status: DC | PRN

## 2014-01-05 MED ORDER — HYDROMORPHONE 0.3 MG/ML IV SOLN
INTRAVENOUS | Status: AC
  Filled 2014-01-05: qty 25

## 2014-01-05 MED ORDER — HYDROMORPHONE 0.3 MG/ML IV SOLN
INTRAVENOUS | Status: DC
  Administered 2014-01-05: 2.1 mg via INTRAVENOUS
  Administered 2014-01-05: 2.4 mg via INTRAVENOUS
  Administered 2014-01-05: 13:00:00 via INTRAVENOUS
  Administered 2014-01-06: 2.39 mg via INTRAVENOUS
  Administered 2014-01-06 (×2): 2.4 mg via INTRAVENOUS
  Administered 2014-01-06: 18:00:00 via INTRAVENOUS
  Administered 2014-01-06: 1.5 mg via INTRAVENOUS
  Administered 2014-01-07: 0.9 mg via INTRAVENOUS
  Administered 2014-01-07: 0.3 mg via INTRAVENOUS
  Administered 2014-01-07: 3.3 mg via INTRAVENOUS
  Filled 2014-01-05 (×2): qty 25

## 2014-01-05 MED ORDER — DEXTROSE 5 % IV SOLN
1.0000 g | INTRAVENOUS | Status: AC
  Administered 2014-01-05: 1 g via INTRAVENOUS
  Filled 2014-01-05 (×2): qty 10

## 2014-01-05 SURGICAL SUPPLY — 46 items
BLADE SURG ROTATE 9660 (MISCELLANEOUS) IMPLANT
CANISTER SUCTION 2500CC (MISCELLANEOUS) ×2 IMPLANT
CHLORAPREP W/TINT 26ML (MISCELLANEOUS) ×2 IMPLANT
COVER SURGICAL LIGHT HANDLE (MISCELLANEOUS) ×2 IMPLANT
DECANTER SPIKE VIAL GLASS SM (MISCELLANEOUS) IMPLANT
DERMABOND ADVANCED (GAUZE/BANDAGES/DRESSINGS)
DERMABOND ADVANCED .7 DNX12 (GAUZE/BANDAGES/DRESSINGS) IMPLANT
DRAPE UTILITY 15X26 W/TAPE STR (DRAPE) ×4 IMPLANT
DRAPE WARM FLUID 44X44 (DRAPE) ×2 IMPLANT
DRSG TEGADERM 2-3/8X2-3/4 SM (GAUZE/BANDAGES/DRESSINGS) ×2 IMPLANT
DRSG VAC ATS MED SENSATRAC (GAUZE/BANDAGES/DRESSINGS) ×2 IMPLANT
ELECT REM PT RETURN 9FT ADLT (ELECTROSURGICAL) ×2
ELECTRODE REM PT RTRN 9FT ADLT (ELECTROSURGICAL) ×1 IMPLANT
GAUZE SPONGE 2X2 8PLY STRL LF (GAUZE/BANDAGES/DRESSINGS) ×1 IMPLANT
GLOVE BIO SURGEON STRL SZ8 (GLOVE) ×2 IMPLANT
GLOVE BIOGEL PI IND STRL 8 (GLOVE) ×1 IMPLANT
GLOVE BIOGEL PI INDICATOR 8 (GLOVE) ×1
GOWN STRL REUS W/ TWL LRG LVL3 (GOWN DISPOSABLE) ×3 IMPLANT
GOWN STRL REUS W/ TWL XL LVL3 (GOWN DISPOSABLE) ×1 IMPLANT
GOWN STRL REUS W/TWL LRG LVL3 (GOWN DISPOSABLE) ×3
GOWN STRL REUS W/TWL XL LVL3 (GOWN DISPOSABLE) ×1
KIT BASIN OR (CUSTOM PROCEDURE TRAY) ×2 IMPLANT
KIT ROOM TURNOVER OR (KITS) ×4 IMPLANT
LIGASURE IMPACT 36 18CM CVD LR (INSTRUMENTS) ×2 IMPLANT
NS IRRIG 1000ML POUR BTL (IV SOLUTION) ×4 IMPLANT
PAD ARMBOARD 7.5X6 YLW CONV (MISCELLANEOUS) ×2 IMPLANT
PAD NEG PRESSURE SENSATRAC (MISCELLANEOUS) ×2 IMPLANT
RELOAD PROXIMATE 75MM BLUE (ENDOMECHANICALS) ×4 IMPLANT
SCISSORS LAP 5X35 DISP (ENDOMECHANICALS) IMPLANT
SET IRRIG TUBING LAPAROSCOPIC (IRRIGATION / IRRIGATOR) ×2 IMPLANT
SLEEVE ENDOPATH XCEL 5M (ENDOMECHANICALS) ×2 IMPLANT
SPONGE GAUZE 2X2 STER 10/PKG (GAUZE/BANDAGES/DRESSINGS) ×1
STAPLER GUN LINEAR PROX 60 (STAPLE) ×2 IMPLANT
STAPLER PROXIMATE 75MM BLUE (STAPLE) ×2 IMPLANT
STAPLER VISISTAT 35W (STAPLE) ×2 IMPLANT
SUT MNCRL AB 4-0 PS2 18 (SUTURE) ×2 IMPLANT
SUT PDS AB 1 CTX 36 (SUTURE) ×4 IMPLANT
SUT VIC AB 3-0 SH 18 (SUTURE) ×2 IMPLANT
TOWEL OR 17X24 6PK STRL BLUE (TOWEL DISPOSABLE) ×4 IMPLANT
TOWEL OR 17X26 10 PK STRL BLUE (TOWEL DISPOSABLE) ×2 IMPLANT
TRAY FOLEY CATH 16FRSI W/METER (SET/KITS/TRAYS/PACK) ×2 IMPLANT
TRAY LAPAROSCOPIC (CUSTOM PROCEDURE TRAY) ×2 IMPLANT
TROCAR XCEL NON-BLD 11X100MML (ENDOMECHANICALS) ×2 IMPLANT
TROCAR XCEL NON-BLD 5MMX100MML (ENDOMECHANICALS) ×2 IMPLANT
TUBING INSUF HEATED (TUBING) ×2 IMPLANT
WATER STERILE IRR 1000ML POUR (IV SOLUTION) IMPLANT

## 2014-01-05 NOTE — Anesthesia Procedure Notes (Signed)
Procedure Name: Intubation Date/Time: 01/05/2014 11:21 AM Performed by: Alexandria Newman Pre-anesthesia Checklist: Patient identified, Timeout performed, Emergency Drugs available, Suction available and Patient being monitored Patient Re-evaluated:Patient Re-evaluated prior to inductionOxygen Delivery Method: Circle system utilized Preoxygenation: Pre-oxygenation with 100% oxygen Intubation Type: IV induction Ventilation: Mask ventilation without difficulty Laryngoscope Size: Mac and 3 Grade View: Grade I Tube type: Oral Tube size: 7.5 mm Number of attempts: 1 Placement Confirmation: positive ETCO2,  ETT inserted through vocal cords under direct vision and breath sounds checked- equal and bilateral Secured at: 21 cm Tube secured with: Tape Dental Injury: Teeth and Oropharynx as per pre-operative assessment

## 2014-01-05 NOTE — Transfer of Care (Signed)
Immediate Anesthesia Transfer of Care Note  Patient: Alexandria Newman  Procedure(s) Performed: Procedure(s): LAPAROSCOPY DIAGNOSTIC CONVERTED TO  LAPAROTOMY ILECTOMY (N/A)  Patient Location: PACU  Anesthesia Type:General  Level of Consciousness: sedated  Airway & Oxygen Therapy: Patient Spontanous Breathing and Patient connected to nasal cannula oxygen  Post-op Assessment: Report given to PACU RN, Post -op Vital signs reviewed and stable and Patient moving all extremities X 4  Post vital signs: Reviewed and stable  Complications: No apparent anesthesia complications

## 2014-01-05 NOTE — Anesthesia Preprocedure Evaluation (Addendum)
Anesthesia Evaluation  Patient identified by MRN, date of birth, ID band Patient awake    Reviewed: Allergy & Precautions, H&P , NPO status , Patient's Chart, lab work & pertinent test results  Airway Mallampati: II  Neck ROM: full    Dental  (+) Teeth Intact, Dental Advidsory Given   Pulmonary neg pulmonary ROS,          Cardiovascular hypertension, On Medications     Neuro/Psych    GI/Hepatic S/p gastric bypass 2009.  Now with SBO   Endo/Other    Renal/GU      Musculoskeletal   Abdominal   Peds  Hematology   Anesthesia Other Findings   Reproductive/Obstetrics                          Anesthesia Physical Anesthesia Plan  ASA: II and emergent  Anesthesia Plan: General   Post-op Pain Management:    Induction: Intravenous, Rapid sequence and Cricoid pressure planned  Airway Management Planned: Oral ETT  Additional Equipment:   Intra-op Plan:   Post-operative Plan: Extubation in OR  Informed Consent: I have reviewed the patients History and Physical, chart, labs and discussed the procedure including the risks, benefits and alternatives for the proposed anesthesia with the patient or authorized representative who has indicated his/her understanding and acceptance.   Dental Advisory Given  Plan Discussed with: CRNA, Anesthesiologist and Surgeon  Anesthesia Plan Comments:        Anesthesia Quick Evaluation

## 2014-01-05 NOTE — Brief Op Note (Signed)
01/04/2014 - 01/05/2014  12:51 PM  PATIENT:  Alexandria Newman  63 y.o. female  PRE-OPERATIVE DIAGNOSIS:  small bowel obstruction  POST-OPERATIVE DIAGNOSIS:  same  PROCEDURE:  Laparoscopy converted to laparotomy and small bowel resection with anastomosis   SURGEON:  Surgeon(s) and Role:    * Kaylyn Lim, MD - Assisting    * Erroll Luna, MD - Primary       ANESTHESIA:   general  EBL:  Total I/O In: 6789 [I.V.:1382; IV Piggyback:250] Out: 325 [Urine:75; Other:200; Blood:50]  BLOOD ADMINISTERED:none  DRAINS: none   LOCAL MEDICATIONS USED:  NONE  SPECIMEN:  Source of Specimen:  ileum  DISPOSITION OF SPECIMEN:  PATHOLOGY  COUNTS:  YES  TOURNIQUET:  * No tourniquets in log *  DICTATION: .Other Dictation: Dictation Number   [id number]  PLAN OF CARE: Admit to inpatient   PATIENT DISPOSITION:  PACU - hemodynamically stable.   Delay start of Pharmacological VTE agent (>24hrs) due to surgical blood loss or risk of bleeding: no

## 2014-01-05 NOTE — Progress Notes (Signed)
Subjective: About the same still having RLQ pain  Objective: Vital signs in last 24 hours: Temp:  [97.9 F (36.6 C)-99.2 F (37.3 C)] 97.9 F (36.6 C) (09/13 0600) Pulse Rate:  [59-80] 80 (09/13 0600) Resp:  [10-25] 19 (09/13 0600) BP: (109-182)/(54-78) 182/78 mmHg (09/13 0600) SpO2:  [90 %-100 %] 90 % (09/13 0600) Weight:  [178 lb 5 oz (80.882 kg)] 178 lb 5 oz (80.882 kg) (09/12 1618) Last BM Date: 01/04/14  Intake/Output from previous day: 09/12 0701 - 09/13 0700 In: 595.8 [I.V.:595.8] Out: 100 [Urine:100] Intake/Output this shift:    GI: tender RLQ.  NON DISTENDED NO DIFUSE PERITONITIS.  SCAR WITHOUT HERNIA  Lab Results:   Recent Labs  01/04/14 1749  WBC 13.8*  HGB 12.8  HCT 37.9  PLT 288   BMET  Recent Labs  01/04/14 1749  NA 140  K 4.6  CL 103  CO2 23  GLUCOSE 137*  BUN 9  CREATININE 0.67  CALCIUM 9.5   PT/INR No results found for this basename: LABPROT, INR,  in the last 72 hours ABG No results found for this basename: PHART, PCO2, PO2, HCO3,  in the last 72 hours  Studies/Results: Ct Abdomen Pelvis W Contrast  01/04/2014   CLINICAL DATA:  Lower abdominal pain.  Back pain.  EXAM: CT ABDOMEN AND PELVIS WITH CONTRAST  TECHNIQUE: Multidetector CT imaging of the abdomen and pelvis was performed using the standard protocol following bolus administration of intravenous contrast.  CONTRAST:  148mL OMNIPAQUE IOHEXOL 300 MG/ML  SOLN  COMPARISON:  CT of the abdomen and pelvis 11/06/2013.  FINDINGS: Lung Bases: Mild scarring in the left lower lobe is unchanged. Mild cardiomegaly.  Abdomen/Pelvis: The proximal and mid small bowel are decompressed, however, the ileum appears dilated up to 4.4 cm in diameter. Additionally, in the region of the distal and terminal ileum there is a severe rotation of the mesentery and bowel, best appreciated on axial images 41-56 of series 2, and coronal images 46-81 of series 5, concerning for early or partial small bowel  obstruction related to either adhesions or internal hernia. The colon is relatively decompressed. There is mild haziness in the associated mesentery, and multiple borderline enlarged and minimally enlarged mesenteric lymph nodes in the right lower quadrant measuring up to 11 mm in short axis.  Postoperative changes of gastric bypass procedure are noted. The appearance of the liver, gallbladder, pancreas, spleen, bilateral adrenal glands and bilateral kidneys is unremarkable. Atherosclerosis throughout the abdominal and pelvic vasculature, without evidence of aneurysm or dissection. Small volume of ascites, most notably within the low anatomic pelvis. No pneumoperitoneum. Status post hysterectomy. Ovaries are not confidently identified may be surgically absent or atrophic. Urinary bladder is nearly completely decompressed.  Musculoskeletal: There are no aggressive appearing lytic or blastic lesions noted in the visualized portions of the skeleton.  IMPRESSION: 1. Findings are concerning for possible early or partial small bowel obstruction related to either adhesions or internal hernia in the right lower quadrant affecting the distal and terminal ileum where there is marked rotation of the mesentery, with some associated haziness in the mesentery which may indicate edema and/or inflammation. There are also some reactive lymph nodes in this region, and there is a small volume of ascites. No pneumoperitoneum at this time. 2. Status post gastric bypass procedure. 3. Additional incidental findings, as above.   Electronically Signed   By: Vinnie Langton M.D.   On: 01/04/2014 19:45    Anti-infectives: Anti-infectives   None  Assessment/Plan: Patient Active Problem List   Diagnosis Date Noted  . Partial small bowel obstruction 01/04/2014  . Small bowel obstruction 11/06/2013   Pt about the same. Reviewed CT from July 2015 and now and they are similar in appearance.  This appears to be more likely  secondary to SBO from adhesions and less likely from internal hernia from gastric bypass.  Recommend laparoscopy and possible laparotomy for this since it is recurrent and she is no better yhis am.  Discussed possibilities and need for bowel resection.   She understands she may need an open procedure as well.  She agrees to proceed to OR for laparotomy/  Laparoscopy and resection if necessary.  The procedure has been discussed with the patient.  Alternative therapies have been discussed with the patient.  Operative risks include bleeding,  Infection,  Organ injury,  Nerve injury,  Blood vessel injury,  DVT,  Pulmonary embolism,  Death,  And possible reoperation.  Medical management risks include worsening of present situation.  The success of the procedure is 50 -90 % at treating patients symptoms.  The patient understands and agrees to proceed.  LOS: 1 day    Raygan Skarda A. 01/05/2014

## 2014-01-05 NOTE — Anesthesia Postprocedure Evaluation (Signed)
Anesthesia Post Note  Patient: Alexandria Newman  Procedure(s) Performed: Procedure(s) (LRB): LAPAROSCOPY DIAGNOSTIC CONVERTED TO  LAPAROTOMY ILECTOMY (N/A)  Anesthesia type: General  Patient location: PACU  Post pain: Pain level controlled and Adequate analgesia  Post assessment: Post-op Vital signs reviewed, Patient's Cardiovascular Status Stable, Respiratory Function Stable, Patent Airway and Pain level controlled  Last Vitals:  Filed Vitals:   01/05/14 1700  BP: 126/53  Pulse: 85  Temp: 36.9 C  Resp: 19    Post vital signs: Reviewed and stable  Level of consciousness: awake, alert  and oriented  Complications: No apparent anesthesia complications

## 2014-01-05 NOTE — Progress Notes (Signed)
UR completed 

## 2014-01-06 ENCOUNTER — Encounter (HOSPITAL_COMMUNITY): Payer: Self-pay | Admitting: Surgery

## 2014-01-06 MED ORDER — KCL IN DEXTROSE-NACL 20-5-0.45 MEQ/L-%-% IV SOLN
INTRAVENOUS | Status: DC
  Administered 2014-01-06 – 2014-01-10 (×11): via INTRAVENOUS
  Filled 2014-01-06 (×15): qty 1000

## 2014-01-06 NOTE — Progress Notes (Signed)
Await bowel function, oob

## 2014-01-06 NOTE — Op Note (Signed)
NAME:  Alexandria Newman, Alexandria Newman NO.:  [id number]  MEDICAL RECORD NO.:  [id number]  LOCATION:  6N21C                        FACILITY:  [facility]  PHYSICIAN:  Marcello Moores A. Nur Rabold, M.D.DATE OF BIRTH:  08-01-1950  DATE OF PROCEDURE: DATE OF DISCHARGE:                              OPERATIVE REPORT   PREOPERATIVE DIAGNOSIS:  Small bowel obstruction.  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  PROCEDURE: 1. Laparoscopy. 2. Laparotomy with small bowel resection and primary anastomosis.  SURGEON:  Marcello Moores A. Annaliyah Willig, M.D.  ANESTHESIA:  General endotracheal anesthesia.  EBL:  Approximately 80 mL.  ASSISTANT:  Johnathan Hausen.  SPECIMENS:  Terminal ileum to pathology.  DRAINS:  None.  IV FLUIDS:  Approximately 1200 mL crystalloid.  INDICATIONS FOR PROCEDURE:  The patient is a 63 year old female with an intermittent history of abdominal pain.  She has a history of a gastric bypass 9 years ago at [facility] and has lost about 95 pounds.  She was seen here at [facility] in July, admitted to the surgical hospital service with small bowel obstruction.  That got better in 24 hours, she was discharged.  She had an area of swelling of small bowel back in July noted on CT scan in the right lower quadrant involving the terminal ileum.  She returned yesterday with similar symptoms of crampy abdominal pain, nausea, but no vomiting.  The pain was significantly severe that brought her to the emergency room.  CT scan showed a similar appearing CT scan with swirling of the terminal ileum, similar to what was seen back in July but more small bowel dilation.  She was admitted for bowel rest, and observation since she did not have any signs of peritonitis. She is examined today, with no better.  I recommended laparoscopy to evaluate for internal hernia from her bypass and to get a better idea  of this area of small bowel showing up on her CT scan.  Risks were discussed with the patient, bleeding, infection, small bowel resection, anastomotic leak, abscess, open procedure, death, DVT, injury to neighboring structures, need for other operative procedures and procedures in general dealing with any of her complications.  She understood the above and agreed to proceed.  DESCRIPTION OF PROCEDURE:  The patient met in the holding area with her family.  Questions were answered from her and her family.  She was then taken back to the operating room and placed supine on the OR table. After induction of general anesthesia, Foley catheter was placed under sterile conditions.  Her abdomen was then prepped and draped in sterile fashion.  She received 2 g of Ancef.  Time-out was done to verify correct patient, correct procedure, duration, and other issues.  The left upper quadrant 5 mm incision was made and an Optiview port was placed and this was guided by the scope into the abdominal cavity without injury.  We inflated and created pneumoperitoneum to 15 mmHg of CO2.  She has significant small bowel dilation in the right lower quadrant, there was an area of ischemic appearing small bowel.  After seeing that, I felt we needed to open to get a better idea what is going on which  we did.  Midline incision was used and we dissected down through the fat to the fascia of the midline of the abdominal wall.  The midline was opened and the abdominal cavity was entered without difficulty.  She had some visibly ischemic ileum that had corkscrewed around an adhesion from her pelvis from previous hysterectomy.  Small bowel was nonviable, was foul smelling, I felt resection was warranted but had not perforated yet.  We were able then to find the proximal demarcation sign which was approximately 40 cm from the ileocecal valve. GIA 75 stapler was used to divide the small bowel there.  The distal 4-5 cm of  ileum into the ileocecal valve was viable.  We divided the bowel just proximal to that trying to preserve the ileocecal valve.  The specimen was caught in corkscrew adhesion from the pelvis, this was lysed.  Specimen was then brought in the field and we used the LigaSure to divide the mesentery.  We then created the side-to-side functional end-to-end anastomosis of the proximal small bowel to the very distal portion of the terminal ileum as it entered the ileocecal valve using a GIA-75 stapling device and TA-60.  This was oriented to prevent any kinking and appeared to laid nicely.  We then closed the common enterotomy with TA-60 stapler.  Mesenteric defect was closed with 3-0 Vicryl.  The anastomosis was tested.  I was able to milk contents through the anastomosis with no signs of leaking or any stress.  Single stitch placed across the anastomosis.  This was not twisted.  The small bowel was not twisted around this and laid nicely.  We then ran the small bowel up toward her gastric bypass where the afferent and efferent limbs were identified and then proximal to that with no evidence of Petersen hernia.  The remainder of her bypass look normal.  Ascending, transverse, descending colon, and pelvic, part of her sigmoid colon down the rectum appeared normal without signs of ischemia.  There was some free fluid but no obvious perforation.  This was irrigated and suctioned out.  Fascia was then closed with #1 PDS.  Wound VAC was placed onto the wound.  The staple was used to close the single port site.  Dry dressing was applied to that.  All final counts sponge, needle, and instruments found to be correct at this portion of the case.  The patient was then awoke, extubated, taken to recovery in satisfactory condition.     Maylin Freeburg A. Ellasyn Swilling, M.D.     TAC/MEDQ  D:  01/05/2014  T:  01/05/2014  Job:  [id number]

## 2014-01-06 NOTE — Progress Notes (Signed)
Patient ID: Alexandria Newman Alexandria Newman ([place]), female   DOB: [birth year], 63 y.o.   MRN: [id number]     [address] [place], [place] [address]    Phone: [phone] FAX: [phone]     Subjective: No n/v.  No flatus. VSS.  Afebrile.  No n/v.   Objective:  Vital signs:  Filed Vitals:   01/05/14 2338 01/06/14 0236 01/06/14 0448 01/06/14 0519  BP:  95/57  98/49  Pulse:  84  80  Temp:  98.3 F (36.8 C)  97.4 F (36.3 C)  TempSrc:  Oral  Oral  Resp: $Remo'15 20 24 20  'JscCF$ Height:      Weight:      SpO2: 96% 96% 96% 94%    Last BM Date: 01/04/14  Intake/Output   Yesterday:  09/13 0701 - 09/14 0700 In: 3841.8 [I.V.:3591.8; IV Piggyback:250] Out: 270 [Urine:700; Drains:5; Blood:50] This shift:    I/O last 3 completed shifts: In: 4437.7 [I.V.:4187.7; IV Piggyback:250] Out: 60 [Urine:800; Drains:5; Other:200; Blood:50]    Physical Exam: General: Pt awake/alert/oriented x4 in no acute distress Chest: cta.  No chest wall pain w good excursion CV:  Pulses intact.  Regular rhythm Abdomen: Soft. Hypoactive bowel sounds.  Nondistended.  Appropriately tender.  Midline wound-vac in place, surrounding lap sites are c/d/i.   No evidence of peritonitis.  No incarcerated hernias. Ext:  SCDs BLE.  No mjr edema.  No cyanosis Skin: No petechiae / purpura   Problem List:   Active Problems:   Partial small bowel obstruction    Results:   Labs: Results for orders placed during the hospital encounter of 01/04/14 (from the past 48 hour(s))  CBC WITH DIFFERENTIAL     Status: Abnormal   Collection Time    01/04/14  5:49 PM      Result Value Ref Range   WBC 13.8 (*) 4.0 - 10.5 K/uL   RBC 4.39  3.87 - 5.11 MIL/uL   Hemoglobin 12.8  12.0 - 15.0 g/dL   HCT 37.9  36.0 - 46.0 %   MCV 86.3  78.0 - 100.0 fL   MCH 29.2  26.0 - 34.0 pg   MCHC 33.8  30.0 - 36.0 g/dL   RDW 13.0  11.5 - 15.5 %   Platelets 288  150 - 400 K/uL    Neutrophils Relative % 90 (*) 43 - 77 %   Neutro Abs 12.3 (*) 1.7 - 7.7 K/uL   Lymphocytes Relative 7 (*) 12 - 46 %   Lymphs Abs 1.0  0.7 - 4.0 K/uL   Monocytes Relative 3  3 - 12 %   Monocytes Absolute 0.5  0.1 - 1.0 K/uL   Eosinophils Relative 0  0 - 5 %   Eosinophils Absolute 0.0  0.0 - 0.7 K/uL   Basophils Relative 0  0 - 1 %   Basophils Absolute 0.0  0.0 - 0.1 K/uL  COMPREHENSIVE METABOLIC PANEL     Status: Abnormal   Collection Time    01/04/14  5:49 PM      Result Value Ref Range   Sodium 140  137 - 147 mEq/L   Potassium 4.6  3.7 - 5.3 mEq/L   Chloride 103  96 - 112 mEq/L   CO2 23  19 - 32 mEq/L   Glucose, Bld 137 (*) 70 - 99 mg/dL   BUN 9  6 - 23 mg/dL   Creatinine, Ser 0.67  0.50 - 1.10 mg/dL   Calcium 9.5  8.4 - 10.5 mg/dL   Total Protein 7.4  6.0 - 8.3 g/dL   Albumin 3.8  3.5 - 5.2 g/dL   AST 18  0 - 37 U/L   ALT 14  0 - 35 U/L   Alkaline Phosphatase 84  39 - 117 U/L   Total Bilirubin 0.4  0.3 - 1.2 mg/dL   GFR calc non Af Amer >90  >90 mL/min   GFR calc Af Amer >90  >90 mL/min   Comment: (NOTE)     The eGFR has been calculated using the CKD EPI equation.     This calculation has not been validated in all clinical situations.     eGFR's persistently <90 mL/min signify possible Chronic Kidney     Disease.   Anion gap 14  5 - 15  LIPASE, BLOOD     Status: None   Collection Time    01/04/14  5:49 PM      Result Value Ref Range   Lipase 58  11 - 59 U/L  LACTIC ACID, PLASMA     Status: None   Collection Time    01/04/14  5:49 PM      Result Value Ref Range   Lactic Acid, Venous 1.9  0.5 - 2.2 mmol/L  URINALYSIS, ROUTINE W REFLEX MICROSCOPIC     Status: Abnormal   Collection Time    01/04/14  7:00 PM      Result Value Ref Range   Color, Urine AMBER (*) YELLOW   Comment: BIOCHEMICALS MAY BE AFFECTED BY COLOR   APPearance CLEAR  CLEAR   Specific Gravity, Urine 1.020  1.005 - 1.030   pH 8.0  5.0 - 8.0   Glucose, UA NEGATIVE  NEGATIVE mg/dL   Hgb urine  dipstick NEGATIVE  NEGATIVE   Bilirubin Urine NEGATIVE  NEGATIVE   Ketones, ur 40 (*) NEGATIVE mg/dL   Protein, ur NEGATIVE  NEGATIVE mg/dL   Urobilinogen, UA 1.0  0.0 - 1.0 mg/dL   Nitrite NEGATIVE  NEGATIVE   Leukocytes, UA NEGATIVE  NEGATIVE   Comment: MICROSCOPIC NOT DONE ON URINES WITH NEGATIVE PROTEIN, BLOOD, LEUKOCYTES, NITRITE, OR GLUCOSE <1000 mg/dL.  SURGICAL PCR SCREEN     Status: None   Collection Time    01/05/14  6:32 AM      Result Value Ref Range   MRSA, PCR NEGATIVE  NEGATIVE   Staphylococcus aureus NEGATIVE  NEGATIVE   Comment:            The Xpert SA Assay (FDA     approved for NASAL specimens     in patients over 28 years of age),     is one component of     a comprehensive surveillance     program.  Test performance has     been validated by [employer] for patients greater     than or equal to 79 year old.     It is not intended     to diagnose infection nor to     guide or monitor treatment.  CBC     Status: Abnormal   Collection Time    01/05/14  3:20 PM      Result Value Ref Range   WBC 7.9  4.0 - 10.5 K/uL   RBC 3.58 (*) 3.87 - 5.11 MIL/uL   Hemoglobin 10.4 (*) 12.0 - 15.0 g/dL   Comment: DELTA CHECK NOTED  HCT 32.6 (*) 36.0 - 46.0 %   MCV 91.1  78.0 - 100.0 fL   MCH 29.1  26.0 - 34.0 pg   MCHC 31.9  30.0 - 36.0 g/dL   RDW 13.5  11.5 - 15.5 %   Platelets 212  150 - 400 K/uL   Comment: DELTA CHECK NOTED    Imaging / Studies: Ct Abdomen Pelvis W Contrast  01/04/2014   CLINICAL DATA:  Lower abdominal pain.  Back pain.  EXAM: CT ABDOMEN AND PELVIS WITH CONTRAST  TECHNIQUE: Multidetector CT imaging of the abdomen and pelvis was performed using the standard protocol following bolus administration of intravenous contrast.  CONTRAST:  136mL OMNIPAQUE IOHEXOL 300 MG/ML  SOLN  COMPARISON:  CT of the abdomen and pelvis 11/06/2013.  FINDINGS: Lung Bases: Mild scarring in the left lower lobe is unchanged. Mild cardiomegaly.  Abdomen/Pelvis: The proximal  and mid small bowel are decompressed, however, the ileum appears dilated up to 4.4 cm in diameter. Additionally, in the region of the distal and terminal ileum there is a severe rotation of the mesentery and bowel, best appreciated on axial images 41-56 of series 2, and coronal images 46-81 of series 5, concerning for early or partial small bowel obstruction related to either adhesions or internal hernia. The colon is relatively decompressed. There is mild haziness in the associated mesentery, and multiple borderline enlarged and minimally enlarged mesenteric lymph nodes in the right lower quadrant measuring up to 11 mm in short axis.  Postoperative changes of gastric bypass procedure are noted. The appearance of the liver, gallbladder, pancreas, spleen, bilateral adrenal glands and bilateral kidneys is unremarkable. Atherosclerosis throughout the abdominal and pelvic vasculature, without evidence of aneurysm or dissection. Small volume of ascites, most notably within the low anatomic pelvis. No pneumoperitoneum. Status post hysterectomy. Ovaries are not confidently identified may be surgically absent or atrophic. Urinary bladder is nearly completely decompressed.  Musculoskeletal: There are no aggressive appearing lytic or blastic lesions noted in the visualized portions of the skeleton.  IMPRESSION: 1. Findings are concerning for possible early or partial small bowel obstruction related to either adhesions or internal hernia in the right lower quadrant affecting the distal and terminal ileum where there is marked rotation of the mesentery, with some associated haziness in the mesentery which may indicate edema and/or inflammation. There are also some reactive lymph nodes in this region, and there is a small volume of ascites. No pneumoperitoneum at this time. 2. Status post gastric bypass procedure. 3. Additional incidental findings, as above.   Electronically Signed   By: Vinnie Langton M.D.   On: 01/04/2014  19:45    Medications / Allergies:  Scheduled Meds: . ALPRAZolam  0.5 mg Oral QHS  . antiseptic oral rinse  7 mL Mouth Rinse q12n4p  . atenolol  50 mg Oral Daily  . buPROPion  150 mg Oral Daily  . cefTRIAXone (ROCEPHIN)  IV  1 g Intravenous Q24H  . chlorhexidine  15 mL Mouth Rinse BID  . enoxaparin (LOVENOX) injection  40 mg Subcutaneous Q24H  . HYDROmorphone PCA 0.3 mg/mL   Intravenous 6 times per day  . lisinopril  20 mg Oral Daily  . metronidazole  500 mg Intravenous Q8H  . pantoprazole (PROTONIX) IV  40 mg Intravenous QHS   Continuous Infusions: . 0.9 % NaCl with KCl 20 mEq / L 125 mL/hr at 01/06/14 0106   PRN Meds:.diphenhydrAMINE, diphenhydrAMINE, diphenhydrAMINE, menthol-cetylpyridinium, naloxone, ondansetron, ondansetron (ZOFRAN) IV, phenol, sodium chloride  Antibiotics: Anti-infectives  Start     Dose/Rate Route Frequency Ordered Stop   01/05/14 1600  metroNIDAZOLE (FLAGYL) IVPB 500 mg     500 mg 100 mL/hr over 60 Minutes Intravenous Every 8 hours 01/05/14 1422     01/05/14 1500  cefTRIAXone (ROCEPHIN) 1 g in dextrose 5 % 50 mL IVPB     1 g 100 mL/hr over 30 Minutes Intravenous Every 24 hours 01/05/14 1422        Assessment/Plan Hx gastric bypass at [facility] SBO Laparoscopy converted to laparotomy, SBR and primary anastomosis---Dr. T Cornett 01/05/14 -NPO, await bowel function -DC foley -IVF -continue PCA today -up to chair, mobilize -SCD/lovenox -rocephin/flagyl -VAC T-TH-Sat HTN -home meds  Erby Pian, [facility] Surgery Pager [phone](7A-4:30P) For consults and floor pages call [phone](7A-4:30P)  01/06/2014 8:13 AM

## 2014-01-06 NOTE — Care Management Note (Signed)
  Page 2 of 2   01/08/2014     2:38:46 PM CARE MANAGEMENT NOTE 01/08/2014  Patient:  AKEILA, LANA   Account Number:  [id number]  Date Initiated:  01/06/2014  Documentation initiated by:  Magdalen Spatz  Subjective/Objective Assessment:     Action/Plan:   Anticipated DC Date:     Anticipated DC Plan:  [place]         Choice offered to / List presented to:  C-1 Patient        [place] arranged  HH-1 RN      [place].   Status of service:   Medicare Important Message given?  YES (If response is "NO", the following Medicare IM given date fields will be blank) Date Medicare IM given:  01/06/2014 Medicare IM given by:  Magdalen Spatz Date Additional Medicare IM given:  01/08/2014 Additional Medicare IM given by:  Magdalen Spatz  Discharge Disposition:    Per UR Regulation:    If discussed at Long Length of Stay Meetings, dates discussed:    Comments:  01-08-14 Alexis with [employer] called back Medicare is primarly insurance BCBS is secondary . VAC approved under Medicare . Vac will be delivered to patient's room . Magdalen Spatz RN BSN   01-08-14 Spoke to [place] at [facility] regarding home wound VAC . BCBS requirements for [facility] are either something is exposed in wound or another form of dressing has been tried before VAC application , and failed . Patient has either requirement .  Authorization from [facility] normally takes 3 to 5 business days . Ubaldo Glassing will submit today and ask for authorization to be expediated .  If patient ready for discharge prior to [place] approving VAC , [employer] can release wound VAC , however, patient would have to sign a form ( APN) accepting financial responsibility for [facility] , if patient leaves hospital with [facility] and [place] denies  [place]. Estimated cost is $160 / day , box of dressings $400, Box of cannisters $269.  If patient signs APN and is denied by Lorella Nimrod , MD can do a peer to peer , to try to get denial over turned.  Magdalen Spatz RN BSN  [phone]   [phone] [facility] application completed and faxed to [employer] . Magdalen Spatz RN BSN   01-06-14 Confirmed face sheet information. VAC to be changed 01-07-14 , will need wound measurements for VAC application. Magdalen Spatz RN BSN [phone]

## 2014-01-07 LAB — BASIC METABOLIC PANEL
Anion gap: 10 (ref 5–15)
BUN: 7 mg/dL (ref 6–23)
CO2: 24 mEq/L (ref 19–32)
Calcium: 8.1 mg/dL — ABNORMAL LOW (ref 8.4–10.5)
Chloride: 106 mEq/L (ref 96–112)
Creatinine, Ser: 0.55 mg/dL (ref 0.50–1.10)
GFR calc Af Amer: >90 mL/min (ref >90)
GFR calc non Af Amer: >90 mL/min (ref >90)
GLUCOSE: 101 mg/dL — AB (ref 70–99)
POTASSIUM: 4.2 meq/L (ref 3.7–5.3)
SODIUM: 140 meq/L (ref 137–147)

## 2014-01-07 MED ORDER — HYDROMORPHONE HCL PF 1 MG/ML IJ SOLN
1.0000 mg | INTRAMUSCULAR | Status: DC | PRN
  Administered 2014-01-07 – 2014-01-09 (×17): 1 mg via INTRAVENOUS
  Filled 2014-01-07 (×18): qty 1

## 2014-01-07 NOTE — Consult Note (Addendum)
WOC wound consult note Reason for Consult: Requested to assist bedside nurse with Vac dressing change.   Wound type: Full thickness post-op midline abd wound Measurement: 12X6X2cm with undermining to 2 cm at 12:00 o'clock Wound bed: beefy red with yellow adipose tissue interspersed throughout Drainage (amount, consistency, odor) Small amt yellow drainage, no odor Periwound: Intact skin surrounding, deep folds located near middle abd surrounding wound. Dressing procedure/placement/frequency: Applied one piece black sponge to 133mm cont suction.  Tolerated with minimal c/o discomfort.  Bedside nurse can change Q Tues/Thurs/Sat. Please re-consult if further assistance is needed.  Thank-you,  Julien Girt MSN, [place], [place], [place], [place]

## 2014-01-07 NOTE — Progress Notes (Signed)
2 Days Post-Op  Subjective: Tearful as iv was beeping all night, wants to get rid of pca, no n/v, no flatus  Objective: Vital signs in last 24 hours: Temp:  [97.2 F (36.2 C)-98.9 F (37.2 C)] 98.2 F (36.8 C) (09/15 0617) Pulse Rate:  [82-95] 82 (09/15 0617) Resp:  [19-30] 21 (09/15 0617) BP: (117-153)/(53-66) 132/56 mmHg (09/15 0617) SpO2:  [92 %-100 %] 96 % (09/15 0617) Last BM Date: 01/04/14  Intake/Output from previous day: 09/14 0701 - 09/15 0700 In: 1000 [I.V.:1000] Out: 615 [Urine:590; Drains:25] Intake/Output this shift:    General appearance: icteric Resp: clear to auscultation bilaterally Cardio: regular rate and rhythm GI: vac in place, few bs, soft  Lab Results:   Recent Labs  01/04/14 1749 01/05/14 1520  WBC 13.8* 7.9  HGB 12.8 10.4*  HCT 37.9 32.6*  PLT 288 212   BMET  Recent Labs  01/04/14 1749 01/07/14 0410  NA 140 140  K 4.6 4.2  CL 103 106  CO2 23 24  GLUCOSE 137* 101*  BUN 9 7  CREATININE 0.67 0.55  CALCIUM 9.5 8.1*    Assessment/Plan: POD 2 elap with sbr for sbo  -NPO, await bowel function   -IVF decrease today -dc pca and put on prn meds per her request  -up to chair, mobilize she needs to be out of bed today -SCD/lovenox  -VAC T-TH-Sat  HTN  -home meds   [facility] 01/07/2014

## 2014-01-07 NOTE — Progress Notes (Signed)
Attempted IV insertion x 2 nurses but failed due to hard stick.  IV Team paged for assistance.

## 2014-01-08 MED ORDER — OXYCODONE-ACETAMINOPHEN 5-325 MG PO TABS
1.0000 | ORAL_TABLET | ORAL | Status: DC | PRN
  Administered 2014-01-09 – 2014-01-11 (×5): 2 via ORAL
  Administered 2014-01-11 (×2): 1 via ORAL
  Filled 2014-01-08 (×6): qty 2
  Filled 2014-01-08: qty 1

## 2014-01-08 NOTE — Progress Notes (Signed)
Patient ID: Gene Colee [facility], female   DOB: 1950-10-08, 63 y.o.   MRN: [id number]     [address] [place], [place] [address]    Phone: [phone] FAX: [phone]     Subjective: Passing flatus.  VSS.  Afebrile.    Objective:  Vital signs:  Filed Vitals:   01/07/14 1308 01/07/14 1959 01/07/14 2237 01/08/14 0422  BP: 121/64  110/55 153/73  Pulse: 87  79 80  Temp: 99.5 F (37.5 C) 98.4 F (36.9 C) 98.4 F (36.9 C) 98.2 F (36.8 C)  TempSrc: Oral Oral Oral Oral  Resp: $Remo'20  18 18  'YJZho$ Height:      Weight:      SpO2: 90%  93% 93%    Last BM Date: 01/04/14  Intake/Output   Yesterday:  09/15 0701 - 09/16 0700 In: 1276.7 [I.V.:1276.7] Out: 180 [Urine:150; Drains:30] This shift:  Total I/O In: -  Out: 200 [Urine:200]  Physical Exam:  General: Pt awake/alert/oriented x4 in no acute distress  Chest: cta. No chest wall pain w good excursion  CV: Pulses intact. Regular rhythm  Abdomen: Soft. +bs. Nondistended. Appropriately tender. Midline wound-vac in place   No evidence of peritonitis. No incarcerated hernias.  Ext: SCDs BLE. No mjr edema. No cyanosis  Skin: No petechiae / purpura   Problem List:   Active Problems:   Partial small bowel obstruction    Results:   Labs: Results for orders placed during the hospital encounter of 01/04/14 (from the past 48 hour(s))  BASIC METABOLIC PANEL     Status: Abnormal   Collection Time    01/07/14  4:10 AM      Result Value Ref Range   Sodium 140  137 - 147 mEq/L   Potassium 4.2  3.7 - 5.3 mEq/L   Chloride 106  96 - 112 mEq/L   CO2 24  19 - 32 mEq/L   Glucose, Bld 101 (*) 70 - 99 mg/dL   BUN 7  6 - 23 mg/dL   Creatinine, Ser 0.55  0.50 - 1.10 mg/dL   Calcium 8.1 (*) 8.4 - 10.5 mg/dL   GFR calc non Af Amer >90  >90 mL/min   GFR calc Af Amer >90  >90 mL/min   Comment: (NOTE)     The eGFR has been calculated using the CKD EPI equation.     This  calculation has not been validated in all clinical situations.     eGFR's persistently <90 mL/min signify possible Chronic Kidney     Disease.   Anion gap 10  5 - 15    Imaging / Studies: No results found.  Medications / Allergies:  Scheduled Meds: . ALPRAZolam  0.5 mg Oral QHS  . antiseptic oral rinse  7 mL Mouth Rinse q12n4p  . atenolol  50 mg Oral Daily  . buPROPion  150 mg Oral Daily  . chlorhexidine  15 mL Mouth Rinse BID  . enoxaparin (LOVENOX) injection  40 mg Subcutaneous Q24H  . lisinopril  20 mg Oral Daily  . pantoprazole (PROTONIX) IV  40 mg Intravenous QHS   Continuous Infusions: . dextrose 5 % and 0.45 % NaCl with KCl 20 mEq/L 100 mL/hr at 01/08/14 0051   PRN Meds:.diphenhydrAMINE, HYDROmorphone (DILAUDID) injection, menthol-cetylpyridinium, ondansetron, phenol  Antibiotics: Anti-infectives   Start     Dose/Rate Route Frequency Ordered Stop   01/05/14 1600  metroNIDAZOLE (FLAGYL) IVPB 500 mg  500 mg 100 mL/hr over 60 Minutes Intravenous Every 8 hours 01/05/14 1422 01/06/14 0932   01/05/14 1500  cefTRIAXone (ROCEPHIN) 1 g in dextrose 5 % 50 mL IVPB     1 g 100 mL/hr over 30 Minutes Intravenous Every 24 hours 01/05/14 1422 01/05/14 1552      Assessment/Plan  Hx gastric bypass at [facility]  SBO  Laparoscopy converted to laparotomy, SBR and primary anastomosis---Dr. T Cornett 01/05/14  -clears today -continue IVF until tolerating PO -up to chair, mobilize, IS -SCD/lovenox  -VAC T-TH-Sat  -pain control, continue with dilaudid.  Percocet when tolerating orals.  HTN  -home meds   Erby Pian, [facility] Surgery Pager [phone]) For consults and floor pages call [phone](7A-4:30P)  01/08/2014 8:46 AM

## 2014-01-08 NOTE — Progress Notes (Signed)
She denies flatus, will keep on some clears, await bowel function has a few bs

## 2014-01-09 NOTE — Progress Notes (Signed)
4 Days Post-Op  Subjective: NO FLATUS NO VOMITING NO BM  Objective: Vital signs in last 24 hours: Temp:  [97.8 F (36.6 C)-98.2 F (36.8 C)] 98.1 F (36.7 C) (09/17 0524) Pulse Rate:  [69-84] 72 (09/17 1001) Resp:  [17-24] 24 (09/17 1001) BP: (115-155)/(56-80) 128/56 mmHg (09/17 0524) SpO2:  [96 %-100 %] 96 % (09/17 0524) Last BM Date: 01/04/14  Intake/Output from previous day: 09/16 0701 - 09/17 0700 In: 2538 [P.O.:238; I.V.:2300] Out: 200 [Urine:200] Intake/Output this shift: Total I/O In: -  Out: 200 [Urine:200]  Incision/Wound:VAC IN PLACE BS PRESENT ND SORE  Lab Results:  No results found for this basename: WBC, HGB, HCT, PLT,  in the last 72 hours BMET  Recent Labs  01/07/14 0410  NA 140  K 4.2  CL 106  CO2 24  GLUCOSE 101*  BUN 7  CREATININE 0.55  CALCIUM 8.1*   PT/INR No results found for this basename: LABPROT, INR,  in the last 72 hours ABG No results found for this basename: PHART, PCO2, PO2, HCO3,  in the last 72 hours  Studies/Results: No results found.  Anti-infectives: Anti-infectives   Start     Dose/Rate Route Frequency Ordered Stop   01/05/14 1600  metroNIDAZOLE (FLAGYL) IVPB 500 mg     500 mg 100 mL/hr over 60 Minutes Intravenous Every 8 hours 01/05/14 1422 01/06/14 0932   01/05/14 1500  cefTRIAXone (ROCEPHIN) 1 g in dextrose 5 % 50 mL IVPB     1 g 100 mL/hr over 30 Minutes Intravenous Every 24 hours 01/05/14 1422 01/05/14 1552      Assessment/Plan: s/p Procedure(s): LAPAROSCOPY DIAGNOSTIC CONVERTED TO  LAPAROTOMY ILECTOMY (N/A) Await return of bowel function. Cont vac OOB  LOS: 5 days    Jurni Cesaro A. 01/09/2014

## 2014-01-10 MED ORDER — PANTOPRAZOLE SODIUM 40 MG PO TBEC
40.0000 mg | DELAYED_RELEASE_TABLET | Freq: Every day | ORAL | Status: DC
  Administered 2014-01-10 – 2014-01-11 (×2): 40 mg via ORAL
  Filled 2014-01-10 (×2): qty 1

## 2014-01-10 NOTE — Progress Notes (Signed)
Patient ID: Alexandria Newman [facility], female   DOB: [birth year], 63 y.o.   MRN: [id number] 5 Days Post-Op  Subjective: Pt feeling better today.  Passing some flatus.  Tolerating clear liquids.  ambulating  Objective: Vital signs in last 24 hours: Temp:  [97 F (36.1 C)-98.5 F (36.9 C)] 98 F (36.7 C) (09/18 0557) Pulse Rate:  [67-73] 71 (09/18 0557) Resp:  [18-24] 18 (09/18 0557) BP: (122-143)/(66-78) 142/71 mmHg (09/18 0557) SpO2:  [94 %-99 %] 97 % (09/18 0557) Last BM Date: 01/04/14  Intake/Output from previous day: 09/17 0701 - 09/18 0700 In: 360 [P.O.:360] Out: 625 [Urine:525; Drains:100] Intake/Output this shift:    PE: Abd: soft, appropriately tender, +BS, ND, wound VAC in place with serosang output Heart: regular  Lab Results:  No results found for this basename: WBC, HGB, HCT, PLT,  in the last 72 hours BMET No results found for this basename: NA, K, CL, CO2, GLUCOSE, BUN, CREATININE, CALCIUM,  in the last 72 hours PT/INR No results found for this basename: LABPROT, INR,  in the last 72 hours CMP     Component Value Date/Time   NA 140 01/07/2014 0410   K 4.2 01/07/2014 0410   CL 106 01/07/2014 0410   CO2 24 01/07/2014 0410   GLUCOSE 101* 01/07/2014 0410   BUN 7 01/07/2014 0410   CREATININE 0.55 01/07/2014 0410   CALCIUM 8.1* 01/07/2014 0410   PROT 7.4 01/04/2014 1749   ALBUMIN 3.8 01/04/2014 1749   AST 18 01/04/2014 1749   ALT 14 01/04/2014 1749   ALKPHOS 84 01/04/2014 1749   BILITOT 0.4 01/04/2014 1749   GFRNONAA >90 01/07/2014 0410   GFRAA >90 01/07/2014 0410   Lipase     Component Value Date/Time   LIPASE 58 01/04/2014 1749       Studies/Results: No results found.  Anti-infectives: Anti-infectives   Start     Dose/Rate Route Frequency Ordered Stop   01/05/14 1600  metroNIDAZOLE (FLAGYL) IVPB 500 mg     500 mg 100 mL/hr over 60 Minutes Intravenous Every 8 hours 01/05/14 1422 01/06/14 0932   01/05/14 1500  cefTRIAXone (ROCEPHIN) 1 g in dextrose 5 % 50 mL  IVPB     1 g 100 mL/hr over 30 Minutes Intravenous Every 24 hours 01/05/14 1422 01/05/14 1552       Assessment/Plan   Hx gastric bypass at [facility]  SBO  Laparoscopy converted to laparotomy, SBR and primary anastomosis---Dr. T Cornett 01/05/14, POD 5  -advance to full liquids -decrease IVFs to 50cc/hr -up to chair, mobilize, IS  -SCD/lovenox  -VAC T-TH-Sat  -pain control, continue with dilaudid. Percocet when tolerating orals.  HTN  -home meds   LOS: 6 days    Payne Garske E 01/10/2014, 9:15 AM Pager: [phone]

## 2014-01-10 NOTE — Progress Notes (Signed)
Seen, agree with above.  Full liquids OK today.

## 2014-01-11 MED ORDER — KCL IN DEXTROSE-NACL 20-5-0.45 MEQ/L-%-% IV SOLN
INTRAVENOUS | Status: DC
  Administered 2014-01-11: 15:00:00 via INTRAVENOUS
  Filled 2014-01-11 (×2): qty 1000

## 2014-01-11 NOTE — Progress Notes (Signed)
6 Days Post-Op  Subjective: Passing lots of flatus but still no bowel movement. Denies nausea. She is hungry and would like to advance diet. Ambulating.  Objective: Vital signs in last 24 hours: Temp:  [97.9 F (36.6 C)-98.7 F (37.1 C)] 97.9 F (36.6 C) (09/19 0515) Pulse Rate:  [69-82] 74 (09/19 0515) Resp:  [15-17] 15 (09/19 0515) BP: (129-151)/(57-68) 129/58 mmHg (09/19 0515) SpO2:  [94 %-98 %] 94 % (09/19 0515) Last BM Date: 01/03/14  Intake/Output from previous day: 09/18 0701 - 09/19 0700 In: -  Out: 1225 [Urine:1200; Drains:25] Intake/Output this shift:    General appearance: alert. Patient looks good. No distress. Resp: clear to auscultation bilaterally GI: abdomen is soft. Nondistended. Nontender. Negative pressure dressing in place. No signs of infection.  Lab Results:  No results found for this or any previous visit (from the past 24 hour(s)).   Studies/Results: No results found.  . ALPRAZolam  0.5 mg Oral QHS  . antiseptic oral rinse  7 mL Mouth Rinse q12n4p  . atenolol  50 mg Oral Daily  . buPROPion  150 mg Oral Daily  . chlorhexidine  15 mL Mouth Rinse BID  . enoxaparin (LOVENOX) injection  40 mg Subcutaneous Q24H  . lisinopril  20 mg Oral Daily  . pantoprazole  40 mg Oral QHS     Assessment/Plan: s/p Procedure(s): LAPAROSCOPY DIAGNOSTIC CONVERTED TO  LAPAROTOMY ILECTOMY  POD #6. Laparotomy and small bowel obstruction due to strangulated SB. Ileus resolving Advance diet to low residue Await complete return of bowel function prior to discharge.  History gastric bypass at [facility] VTE-continue Lovenox  Hypertension. Continue home meds.  @PROBHOSP @  LOS: 7 days    Tyberius Ryner M 01/11/2014  . .prob

## 2014-01-12 ENCOUNTER — Other Ambulatory Visit (INDEPENDENT_AMBULATORY_CARE_PROVIDER_SITE_OTHER): Payer: Self-pay | Admitting: General Surgery

## 2014-01-12 DIAGNOSIS — K642 Third degree hemorrhoids: Secondary | ICD-10-CM

## 2014-01-12 MED ORDER — HYDROCORTISONE ACETATE 25 MG RE SUPP: 25.0000 mg | Freq: Two times a day (BID) | RECTAL | Status: DC

## 2014-01-12 NOTE — Progress Notes (Signed)
Pt discharged to home accompy by husband.  Changed VAC over to the portable unit and explained and showed pt/husb how to use the home VAC.  Cannisters given and explained.  All discharge instructions given and explained to pt/husband.  Pt states she does not need any pain Rx right now for home use.  Gave pt [facility] # if needs to call them with any questions or concerns.  Also, gave my # on the dept if any questions once home.

## 2014-01-12 NOTE — Progress Notes (Signed)
Pt for discharge to home today.  [facility] aware that pt is going home today and will visit pt on Monday.  Will transfer VAC to portable VAC and show pt/family how to use it.  VAC changed yesterday and [facility] will come to change it on Monday.

## 2014-01-12 NOTE — Discharge Instructions (Signed)
See above

## 2014-01-12 NOTE — Progress Notes (Signed)
Called Care management to facilitate home care, set up by Marcelle Smiling with [facility].  VAC changed yesterday.

## 2014-01-12 NOTE — Care Management Note (Signed)
CARE MANAGEMENT NOTE 01/12/2014  Patient:  LASHAN, MACIAS   Account Number:  [id number]  Date Initiated:  01/06/2014  Documentation initiated by:  Magdalen Spatz  Subjective/Objective Assessment:     Action/Plan:   Anticipated DC Date:  01/12/2014   Anticipated DC Plan:  [place]         Choice offered to / List presented to:  C-1 Patient        [place] arranged  HH-1 RN      [place].   Status of service:  Completed, signed off Medicare Important Message given?  YES (If response is "NO", the following Medicare IM given date fields will be blank) Date Medicare IM given:  01/06/2014 Medicare IM given by:  Magdalen Spatz Date Additional Medicare IM given:  01/08/2014 Additional Medicare IM given by:  Magdalen Spatz  Discharge Disposition:  [place]  Per UR Regulation:    If discussed at Long Length of Stay Meetings, dates discussed:   01/09/2014    Comments:  01/12/14 ATIKAHALLRNCM [phone] Received call from patient's nurse to confirm patient is set up with [facility] RN for wound vac care. Call made to [facility], [place] to confirm [facility] start of care date will be Monday 01/13/14. Made [place] aware that patient will discharge today. No further needs assessed. ATIKAHALLRN,CM [phone]    01-08-14 Alexis with [employer] called back Medicare is primarly insurance BCBS is secondary . VAC approved under Medicare . Vac will be delivered to patient's room . Magdalen Spatz RN BSN   01-08-14 Spoke to [place] at [facility] regarding home wound VAC . BCBS requirements for [facility] are either something is exposed in wound or another form of dressing has been tried before VAC application , and failed . Patient has either requirement .  Authorization from [facility] normally takes 3 to 5 business days . Ubaldo Glassing will submit today and ask for authorization to be expediated .  If patient ready for discharge prior to [place] approving VAC , [employer] can  release wound VAC , however, patient would have to sign a form ( APN) accepting financial responsibility for [facility] , if patient leaves hospital with [facility] and [place] denies  [place]. Estimated cost is $160 / day , box of dressings $400, Box of cannisters $269.  If patient signs APN and is denied by Lorella Nimrod , MD can do a peer to peer , to try to get denial over turned.  Magdalen Spatz RN BSN [phone]   3-38-25 [facility] application completed and faxed to [employer] . Magdalen Spatz RN BSN   01-06-14 Confirmed face sheet information. VAC to be changed 01-07-14 , will need wound measurements for VAC application. Magdalen Spatz RN BSN [phone]

## 2014-01-12 NOTE — Discharge Summary (Signed)
Patient ID: Alexandria Newman [id number] 63 y.o. May 14, 1950  Admit date: 01/04/2014  Discharge date and time: No discharge date for patient encounter.  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 She had a prior admission for abdominal pain which resolved. She is admitted at this time with severe abdominal pain and CT scan showed swelling of the terminal ileal mesentery. She not improve with 24 hours of conservative therapy and surgical intervention was recommended.  Hospital Course: The patient was admitted on 01/04/2014.She was initially initially treated with bowel rest and IV rehydration.Her condition did not improve and she was taken to the operating room by Dr. Mitzie Na on 01/05/2014. She underwent a laparotomy and small bowel resection. She had an adhesive small bowel suction and visibly ischemic ileum and underwent a segmental small bowel resection.    Postoperatively she did relatively well. She had a nasogastric tube and an ileus for several days but eventually her bowel function returned and she began passing flatus and diet was advanced. She had a bowel movement the day prior to discharge and was feeling well and tolerating a regular diet and wanted to go home.    She has a negative pressure dressing on her midline wound, and home health nursing has been arranged changes on Tuesday Thursday and Saturday.    Diet and  activities were discussed. She was offered narcotic pain medicine and she stated she did not want that and so no prescriptions were written.     She was advised to return to see Dr. Brantley Stage in the office in approximately 10 days for a wound check.    She will continue her usual medications for her hypertension.     Consults: None  Significant Diagnostic Studies: Labs, CT scans, surgical pathology  Treatments: surgery: Laparotomy with small bowel resection  Disposition: Home  Patient Instructions:    Medication List         acetaminophen 325 MG tablet  Commonly known as:  TYLENOL  Take 2 tablets (650 mg total) by mouth every 6 (six) hours as needed for mild pain (or Temp > 100).     ALPRAZolam 0.5 MG tablet  Commonly known as:  XANAX  Take 0.5 mg by mouth at bedtime.     atenolol 50 MG tablet  Commonly known as:  TENORMIN  Take 50 mg by mouth daily.     B-12 (METHYLCOBALAMIN) SL  Place 2 tablets under the tongue every morning.     buPROPion 150 MG 24 hr tablet  Commonly known as:  WELLBUTRIN XL  Take 150 mg by mouth daily.     citalopram 20 MG tablet  Commonly known as:  CELEXA  Take 20 mg by mouth daily.     lisinopril 20 MG tablet  Commonly known as:  PRINIVIL,ZESTRIL  Take 20 mg by mouth daily.     simvastatin 20 MG tablet  Commonly known as:  ZOCOR  Take 20 mg by mouth every evening.        Activity: no heavy lifting for 6 weeks Diet:  low fat, low cholesterol diet Wound Care: as directed  Follow-up:  With Dr. Brantley Stage in 10 days.  Signed: Edsel Petrin. Dalbert Batman, M.D., FACS General and minimally invasive surgery Breast and Colorectal Surgery  01/12/2014, 9:05 AM

## 2014-01-13 ENCOUNTER — Telehealth (INDEPENDENT_AMBULATORY_CARE_PROVIDER_SITE_OTHER): Payer: Self-pay

## 2014-01-13 DIAGNOSIS — Z48815 Encounter for surgical aftercare following surgery on the digestive system: Secondary | ICD-10-CM | POA: Diagnosis not present

## 2014-01-13 DIAGNOSIS — Z9884 Bariatric surgery status: Secondary | ICD-10-CM | POA: Diagnosis not present

## 2014-01-13 DIAGNOSIS — Z4801 Encounter for change or removal of surgical wound dressing: Secondary | ICD-10-CM | POA: Diagnosis not present

## 2014-01-13 DIAGNOSIS — I10 Essential (primary) hypertension: Secondary | ICD-10-CM | POA: Diagnosis not present

## 2014-01-14 ENCOUNTER — Other Ambulatory Visit (INDEPENDENT_AMBULATORY_CARE_PROVIDER_SITE_OTHER): Payer: Self-pay

## 2014-01-14 DIAGNOSIS — R197 Diarrhea, unspecified: Secondary | ICD-10-CM

## 2014-01-15 DIAGNOSIS — I10 Essential (primary) hypertension: Secondary | ICD-10-CM | POA: Diagnosis not present

## 2014-01-15 DIAGNOSIS — S31109A Unspecified open wound of abdominal wall, unspecified quadrant without penetration into peritoneal cavity, initial encounter: Secondary | ICD-10-CM | POA: Diagnosis not present

## 2014-01-15 DIAGNOSIS — Z9884 Bariatric surgery status: Secondary | ICD-10-CM | POA: Diagnosis not present

## 2014-01-15 DIAGNOSIS — Z48815 Encounter for surgical aftercare following surgery on the digestive system: Secondary | ICD-10-CM | POA: Diagnosis not present

## 2014-01-15 DIAGNOSIS — Z4801 Encounter for change or removal of surgical wound dressing: Secondary | ICD-10-CM | POA: Diagnosis not present

## 2014-01-16 DIAGNOSIS — Z48815 Encounter for surgical aftercare following surgery on the digestive system: Secondary | ICD-10-CM | POA: Diagnosis not present

## 2014-01-16 DIAGNOSIS — I10 Essential (primary) hypertension: Secondary | ICD-10-CM | POA: Diagnosis not present

## 2014-01-16 DIAGNOSIS — Z4801 Encounter for change or removal of surgical wound dressing: Secondary | ICD-10-CM | POA: Diagnosis not present

## 2014-01-16 DIAGNOSIS — Z9884 Bariatric surgery status: Secondary | ICD-10-CM | POA: Diagnosis not present

## 2014-01-18 DIAGNOSIS — I10 Essential (primary) hypertension: Secondary | ICD-10-CM | POA: Diagnosis not present

## 2014-01-18 DIAGNOSIS — Z48815 Encounter for surgical aftercare following surgery on the digestive system: Secondary | ICD-10-CM | POA: Diagnosis not present

## 2014-01-18 DIAGNOSIS — Z4801 Encounter for change or removal of surgical wound dressing: Secondary | ICD-10-CM | POA: Diagnosis not present

## 2014-01-18 DIAGNOSIS — Z9884 Bariatric surgery status: Secondary | ICD-10-CM | POA: Diagnosis not present

## 2014-01-20 ENCOUNTER — Telehealth (INDEPENDENT_AMBULATORY_CARE_PROVIDER_SITE_OTHER): Payer: Self-pay

## 2014-01-20 DIAGNOSIS — Z48815 Encounter for surgical aftercare following surgery on the digestive system: Secondary | ICD-10-CM | POA: Diagnosis not present

## 2014-01-20 DIAGNOSIS — I10 Essential (primary) hypertension: Secondary | ICD-10-CM | POA: Diagnosis not present

## 2014-01-20 DIAGNOSIS — Z9884 Bariatric surgery status: Secondary | ICD-10-CM | POA: Diagnosis not present

## 2014-01-20 DIAGNOSIS — Z4801 Encounter for change or removal of surgical wound dressing: Secondary | ICD-10-CM | POA: Diagnosis not present

## 2014-01-20 NOTE — Telephone Encounter (Signed)
Okay to refill pain medication.  Oxycodone 5 mg 1-2 by mouth every 4 hours when necessary pain #40.  Please make sure that the patient is eating okay and having regular bowel function

## 2014-01-20 NOTE — Telephone Encounter (Signed)
Pt s/p lapartomy ilectomy on 01/05/14 by Dr Brantley Stage. Pt called to request pain medication.  She rates her pain level as a 5 out of 10 at this time.  It was difficult to rest. Informed pt that Dr Brantley Stage is out of the office and we will send this to our urgent office dr and we will contact her as soon as we recieve a response. Pt verbalized understanding Will also place this in allscripts

## 2014-01-24 DIAGNOSIS — I10 Essential (primary) hypertension: Secondary | ICD-10-CM | POA: Diagnosis not present

## 2014-01-24 DIAGNOSIS — Z48815 Encounter for surgical aftercare following surgery on the digestive system: Secondary | ICD-10-CM | POA: Diagnosis not present

## 2014-01-24 DIAGNOSIS — Z9884 Bariatric surgery status: Secondary | ICD-10-CM | POA: Diagnosis not present

## 2014-01-24 DIAGNOSIS — Z4801 Encounter for change or removal of surgical wound dressing: Secondary | ICD-10-CM | POA: Diagnosis not present

## 2014-01-27 DIAGNOSIS — Z9884 Bariatric surgery status: Secondary | ICD-10-CM | POA: Diagnosis not present

## 2014-01-27 DIAGNOSIS — Z48815 Encounter for surgical aftercare following surgery on the digestive system: Secondary | ICD-10-CM | POA: Diagnosis not present

## 2014-01-27 DIAGNOSIS — Z4801 Encounter for change or removal of surgical wound dressing: Secondary | ICD-10-CM | POA: Diagnosis not present

## 2014-01-27 DIAGNOSIS — I10 Essential (primary) hypertension: Secondary | ICD-10-CM | POA: Diagnosis not present

## 2014-01-29 DIAGNOSIS — Z4801 Encounter for change or removal of surgical wound dressing: Secondary | ICD-10-CM | POA: Diagnosis not present

## 2014-01-29 DIAGNOSIS — Z9884 Bariatric surgery status: Secondary | ICD-10-CM | POA: Diagnosis not present

## 2014-01-29 DIAGNOSIS — I10 Essential (primary) hypertension: Secondary | ICD-10-CM | POA: Diagnosis not present

## 2014-01-29 DIAGNOSIS — Z48815 Encounter for surgical aftercare following surgery on the digestive system: Secondary | ICD-10-CM | POA: Diagnosis not present

## 2014-01-31 DIAGNOSIS — I10 Essential (primary) hypertension: Secondary | ICD-10-CM | POA: Diagnosis not present

## 2014-01-31 DIAGNOSIS — Z9884 Bariatric surgery status: Secondary | ICD-10-CM | POA: Diagnosis not present

## 2014-01-31 DIAGNOSIS — Z4801 Encounter for change or removal of surgical wound dressing: Secondary | ICD-10-CM | POA: Diagnosis not present

## 2014-01-31 DIAGNOSIS — Z48815 Encounter for surgical aftercare following surgery on the digestive system: Secondary | ICD-10-CM | POA: Diagnosis not present

## 2014-02-03 DIAGNOSIS — Z9884 Bariatric surgery status: Secondary | ICD-10-CM | POA: Diagnosis not present

## 2014-02-03 DIAGNOSIS — I10 Essential (primary) hypertension: Secondary | ICD-10-CM | POA: Diagnosis not present

## 2014-02-03 DIAGNOSIS — Z4801 Encounter for change or removal of surgical wound dressing: Secondary | ICD-10-CM | POA: Diagnosis not present

## 2014-02-03 DIAGNOSIS — Z48815 Encounter for surgical aftercare following surgery on the digestive system: Secondary | ICD-10-CM | POA: Diagnosis not present

## 2014-02-05 DIAGNOSIS — Z9884 Bariatric surgery status: Secondary | ICD-10-CM | POA: Diagnosis not present

## 2014-02-05 DIAGNOSIS — I10 Essential (primary) hypertension: Secondary | ICD-10-CM | POA: Diagnosis not present

## 2014-02-05 DIAGNOSIS — Z4801 Encounter for change or removal of surgical wound dressing: Secondary | ICD-10-CM | POA: Diagnosis not present

## 2014-02-05 DIAGNOSIS — Z48815 Encounter for surgical aftercare following surgery on the digestive system: Secondary | ICD-10-CM | POA: Diagnosis not present

## 2014-02-07 DIAGNOSIS — I10 Essential (primary) hypertension: Secondary | ICD-10-CM | POA: Diagnosis not present

## 2014-02-07 DIAGNOSIS — Z48815 Encounter for surgical aftercare following surgery on the digestive system: Secondary | ICD-10-CM | POA: Diagnosis not present

## 2014-02-07 DIAGNOSIS — Z4801 Encounter for change or removal of surgical wound dressing: Secondary | ICD-10-CM | POA: Diagnosis not present

## 2014-02-07 DIAGNOSIS — Z9884 Bariatric surgery status: Secondary | ICD-10-CM | POA: Diagnosis not present

## 2014-02-10 DIAGNOSIS — Z4801 Encounter for change or removal of surgical wound dressing: Secondary | ICD-10-CM | POA: Diagnosis not present

## 2014-02-10 DIAGNOSIS — Z48815 Encounter for surgical aftercare following surgery on the digestive system: Secondary | ICD-10-CM | POA: Diagnosis not present

## 2014-02-10 DIAGNOSIS — Z9884 Bariatric surgery status: Secondary | ICD-10-CM | POA: Diagnosis not present

## 2014-02-10 DIAGNOSIS — I10 Essential (primary) hypertension: Secondary | ICD-10-CM | POA: Diagnosis not present

## 2014-02-12 DIAGNOSIS — I10 Essential (primary) hypertension: Secondary | ICD-10-CM | POA: Diagnosis not present

## 2014-02-12 DIAGNOSIS — Z9884 Bariatric surgery status: Secondary | ICD-10-CM | POA: Diagnosis not present

## 2014-02-12 DIAGNOSIS — Z4801 Encounter for change or removal of surgical wound dressing: Secondary | ICD-10-CM | POA: Diagnosis not present

## 2014-02-12 DIAGNOSIS — Z48815 Encounter for surgical aftercare following surgery on the digestive system: Secondary | ICD-10-CM | POA: Diagnosis not present

## 2014-02-14 DIAGNOSIS — Z9884 Bariatric surgery status: Secondary | ICD-10-CM | POA: Diagnosis not present

## 2014-02-14 DIAGNOSIS — Z4801 Encounter for change or removal of surgical wound dressing: Secondary | ICD-10-CM | POA: Diagnosis not present

## 2014-02-14 DIAGNOSIS — I10 Essential (primary) hypertension: Secondary | ICD-10-CM | POA: Diagnosis not present

## 2014-02-14 DIAGNOSIS — Z48815 Encounter for surgical aftercare following surgery on the digestive system: Secondary | ICD-10-CM | POA: Diagnosis not present

## 2014-02-26 DIAGNOSIS — L720 Epidermal cyst: Secondary | ICD-10-CM | POA: Diagnosis not present

## 2014-02-26 DIAGNOSIS — L821 Other seborrheic keratosis: Secondary | ICD-10-CM | POA: Diagnosis not present

## 2014-02-26 DIAGNOSIS — L918 Other hypertrophic disorders of the skin: Secondary | ICD-10-CM | POA: Diagnosis not present

## 2014-02-27 ENCOUNTER — Telehealth (INDEPENDENT_AMBULATORY_CARE_PROVIDER_SITE_OTHER): Payer: Self-pay

## 2014-02-27 NOTE — Telephone Encounter (Signed)
Pt s/p laparotomy ileectomy on 01/05/14 by Dr Brantley Stage. Pt is calling today to see if she can get a refill on her Percocet 5/325mg . Pt rates her pain a 6 out of 10 at this time. Pt states that she has been taking Tylenol with some relief. Pt states that she has developed a cold and has been coughing a lot which has made her very sore. Informed pt that I would send a message to Dr Brantley Stage and we will contact her as soon as we receive a response. Pt verbalized understanding. Will put this message in Allscprits as well

## 2014-02-27 NOTE — Telephone Encounter (Signed)
Called pt to inform her that Dr Brantley Stage has authorized a refill of Percocet 5/325 #20 and this rx will be ready for her to pickup at the front desk. Pt verbalized understanding

## 2014-02-27 NOTE — Telephone Encounter (Signed)
Has open wound   Only giver her 39

## 2014-04-25 DIAGNOSIS — K911 Postgastric surgery syndromes: Secondary | ICD-10-CM

## 2014-04-25 HISTORY — DX: Postgastric surgery syndromes: K91.1

## 2014-05-28 DIAGNOSIS — E785 Hyperlipidemia, unspecified: Secondary | ICD-10-CM | POA: Diagnosis not present

## 2014-05-28 DIAGNOSIS — R197 Diarrhea, unspecified: Secondary | ICD-10-CM | POA: Diagnosis not present

## 2014-05-28 DIAGNOSIS — H6983 Other specified disorders of Eustachian tube, bilateral: Secondary | ICD-10-CM | POA: Diagnosis not present

## 2014-05-28 DIAGNOSIS — I10 Essential (primary) hypertension: Secondary | ICD-10-CM | POA: Diagnosis not present

## 2014-05-28 DIAGNOSIS — J069 Acute upper respiratory infection, unspecified: Secondary | ICD-10-CM | POA: Diagnosis not present

## 2014-05-28 DIAGNOSIS — F418 Other specified anxiety disorders: Secondary | ICD-10-CM | POA: Diagnosis not present

## 2014-05-28 DIAGNOSIS — F5104 Psychophysiologic insomnia: Secondary | ICD-10-CM | POA: Diagnosis not present

## 2014-08-04 DIAGNOSIS — R197 Diarrhea, unspecified: Secondary | ICD-10-CM | POA: Diagnosis not present

## 2014-12-01 ENCOUNTER — Other Ambulatory Visit: Payer: Self-pay | Admitting: Family Medicine

## 2014-12-01 NOTE — Telephone Encounter (Signed)
Patient requesting refill. 

## 2015-01-05 ENCOUNTER — Other Ambulatory Visit: Payer: Self-pay | Admitting: Family Medicine

## 2015-01-05 NOTE — Telephone Encounter (Signed)
Patient requesting refill. 

## 2015-01-06 ENCOUNTER — Telehealth: Payer: Self-pay | Admitting: Family Medicine

## 2015-01-06 NOTE — Telephone Encounter (Signed)
Spoke with Pie from [employer] and informed her that was her last visit but TMJ has not been discussed since 01/03/14. Pie will be sending a update on information about her Dental Health and states patient was very indecisive about their program for TMJ and wanted to make [place] aware of this.

## 2015-01-06 NOTE — Telephone Encounter (Signed)
Pie from [facility] TMJ requesting return call: was wanting to know on the last patient visit 08-04-14 if she was seen for TMJ.  [phone]

## 2015-01-16 ENCOUNTER — Encounter: Payer: Self-pay | Admitting: Family Medicine

## 2015-02-04 ENCOUNTER — Encounter (INDEPENDENT_AMBULATORY_CARE_PROVIDER_SITE_OTHER): Payer: Self-pay

## 2015-02-04 ENCOUNTER — Encounter: Payer: Self-pay | Admitting: Family Medicine

## 2015-02-04 ENCOUNTER — Ambulatory Visit (INDEPENDENT_AMBULATORY_CARE_PROVIDER_SITE_OTHER): Payer: Medicare Other | Admitting: Family Medicine

## 2015-02-04 VITALS — BP 122/78 | HR 63 | Temp 98.0°F | Resp 18 | Ht 65.0 in | Wt 157.3 lb

## 2015-02-04 DIAGNOSIS — Z23 Encounter for immunization: Secondary | ICD-10-CM

## 2015-02-04 DIAGNOSIS — Z9884 Bariatric surgery status: Secondary | ICD-10-CM

## 2015-02-04 DIAGNOSIS — Z8719 Personal history of other diseases of the digestive system: Secondary | ICD-10-CM | POA: Insufficient documentation

## 2015-02-04 DIAGNOSIS — I10 Essential (primary) hypertension: Secondary | ICD-10-CM | POA: Diagnosis not present

## 2015-02-04 DIAGNOSIS — G4700 Insomnia, unspecified: Secondary | ICD-10-CM

## 2015-02-04 DIAGNOSIS — Z9889 Other specified postprocedural states: Secondary | ICD-10-CM | POA: Diagnosis not present

## 2015-02-04 DIAGNOSIS — K911 Postgastric surgery syndromes: Secondary | ICD-10-CM | POA: Diagnosis not present

## 2015-02-04 DIAGNOSIS — Z1239 Encounter for other screening for malignant neoplasm of breast: Secondary | ICD-10-CM

## 2015-02-04 DIAGNOSIS — M26609 Unspecified temporomandibular joint disorder, unspecified side: Secondary | ICD-10-CM | POA: Insufficient documentation

## 2015-02-04 DIAGNOSIS — E8881 Metabolic syndrome: Secondary | ICD-10-CM | POA: Insufficient documentation

## 2015-02-04 DIAGNOSIS — M19041 Primary osteoarthritis, right hand: Secondary | ICD-10-CM | POA: Insufficient documentation

## 2015-02-04 DIAGNOSIS — M5030 Other cervical disc degeneration, unspecified cervical region: Secondary | ICD-10-CM | POA: Insufficient documentation

## 2015-02-04 DIAGNOSIS — E785 Hyperlipidemia, unspecified: Secondary | ICD-10-CM | POA: Diagnosis not present

## 2015-02-04 DIAGNOSIS — E538 Deficiency of other specified B group vitamins: Secondary | ICD-10-CM

## 2015-02-04 DIAGNOSIS — F320 Major depressive disorder, single episode, mild: Secondary | ICD-10-CM

## 2015-02-04 DIAGNOSIS — R739 Hyperglycemia, unspecified: Secondary | ICD-10-CM

## 2015-02-04 DIAGNOSIS — E559 Vitamin D deficiency, unspecified: Secondary | ICD-10-CM | POA: Insufficient documentation

## 2015-02-04 MED ORDER — BUPROPION HCL ER (XL) 150 MG PO TB24: ORAL_TABLET | ORAL | Status: DC

## 2015-02-04 MED ORDER — ALPRAZOLAM 0.5 MG PO TABS: 0.5000 mg | ORAL_TABLET | Freq: Two times a day (BID) | ORAL | Status: DC | PRN

## 2015-02-04 MED ORDER — ATENOLOL 50 MG PO TABS: 50.0000 mg | ORAL_TABLET | Freq: Every day | ORAL | Status: DC

## 2015-02-04 MED ORDER — LISINOPRIL 20 MG PO TABS: 20.0000 mg | ORAL_TABLET | Freq: Every day | ORAL | Status: DC

## 2015-02-04 MED ORDER — ATORVASTATIN CALCIUM 40 MG PO TABS: 40.0000 mg | ORAL_TABLET | Freq: Every evening | ORAL | Status: DC

## 2015-02-04 NOTE — Progress Notes (Signed)
Name: Alexandria Newman [facility]   MRN: [id number]    DOB: [birth year]   Date:02/03/65       Progress Note  Subjective  Chief Complaint  Chief Complaint  Patient presents with  . Medication Refill    follow-up  . Hypertension  . Hyperlipidemia  . Anxiety    HPI  HTN: patient has been taking medication as prescribed, no side effects, no chest pain or palpitation.   Hyperlipidemia: takes Atorvastatin, no myalgia.   Depression Mild single episode: she has been on medication for years.  She started taking medication when she had an argument with her sister years ago, they were getting along again. They went to the beach as an entire family and since than they have not been talking again.  She states she is less stressed because she retired this Summer. She gets along well with her husband and they have been travelling also remodeling the house. She denies crying spells or anhedonia. She would like to wean self off medication, but advised to wait until March because of possible worsening of symptoms during winter months.   Insomnia: taking Alprazolam prn qhs for sleep and sometimes during the day when she is very upset. It helps her fall and stay asleep  Metabolic Syndrome: last OVZC5Y 6.0 , she denies polyphagia, polydipsia or polyuria.  Gastric bypass: she is not taking Vitamin D but has been taking B12 supplementation, we will check for anemia, denies fatigue    Patient Active Problem List   Diagnosis Date Noted  . Hyperlipidemia 02/04/2015  . History of small bowel obstruction 02/04/2015  . Hypertension, benign 02/04/2015  . DDD (degenerative disc disease), cervical 02/04/2015  . 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  . Insomnia 02/04/2015  . Osteoarthritis of finger of right hand 02/04/2015  . Metabolic syndrome 85/05/7739  . Depression, major, single episode, mild ([place]) 02/04/2015  . 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 Vitamin D deficiency 02/04/2015   . Vitamin B12 deficiency 02/04/2015  . H/O gastric bypass     Past Surgical History  Procedure Laterality Date  . Gastric bypass  2009  . Appendectomy    . Abdominal hysterectomy    . Laparoscopy N/A 01/05/2014    Procedure: LAPAROSCOPY DIAGNOSTIC CONVERTED TO  LAPAROTOMY ILECTOMY;  Surgeon: Erroll Luna, MD;  Location: [facility];  Service: General;  Laterality: N/A;    Family History  Problem Relation Age of Onset  . Heart disease Mother   . CAD Mother   . Kidney disease Mother   . Heart attack Father   . Obesity Sister   . Heart attack Brother     after surgery    Social History   Social History  . Marital Status: Single    Spouse Name: N/A  . Number of Children: N/A  . Years of Education: N/A   Occupational History  . Not on file.   Social History Main Topics  . Smoking status: Never Smoker   . Smokeless tobacco: Never Used  . Alcohol Use: No  . Drug Use: No  . Sexual Activity: No   Other Topics Concern  . Not on file   Social History Narrative     Current outpatient prescriptions:  .  acetaminophen (TYLENOL) 325 MG tablet, Take 2 tablets (650 mg total) by mouth every 6 (six) hours as needed for mild pain (or Temp > 100)., Disp: , Rfl:  .  ALPRAZolam (XANAX) 0.5 MG tablet, Take 1 tablet (0.5 mg total) by mouth 2 (  two) times daily as needed for anxiety., Disp: 60 tablet, Rfl: 2 .  atenolol (TENORMIN) 50 MG tablet, Take 1 tablet (50 mg total) by mouth daily., Disp: 90 tablet, Rfl: 1 .  atorvastatin (LIPITOR) 40 MG tablet, Take 1 tablet (40 mg total) by mouth every evening., Disp: 90 tablet, Rfl: 1 .  B-12, METHYLCOBALAMIN, SL, Place 2 tablets under the tongue every morning. , Disp: , Rfl:  .  buPROPion (WELLBUTRIN XL) 150 MG 24 hr tablet, TAKE ONE TABLET BY MOUTH ONCE DAILY FOR DEPRESSION, Disp: 90 tablet, Rfl: 1 .  citalopram (CELEXA) 20 MG tablet, TAKE ONE TABLET BY MOUTH ONCE DAILY, Disp: 90 tablet, Rfl: 0 .  lisinopril (PRINIVIL,ZESTRIL) 20 MG tablet, Take  1 tablet (20 mg total) by mouth daily., Disp: 90 tablet, Rfl: 1  No Known Allergies   ROS  Constitutional: Negative for fever or weight change.  Respiratory: Negative for cough and shortness of breath.   Cardiovascular: Negative for chest pain or palpitations.  Gastrointestinal: Negative for abdominal pain, no bowel changes ( occasional dumping syndrome - but controlled with Imodium)   Musculoskeletal: Negative for gait problem or joint swelling.  Skin: Negative for rash.  Neurological: Negative for dizziness or headache.  No other specific complaints in a complete review of systems (except as listed in HPI above).   Objective  Filed Vitals:   02/04/15 0819  BP: 122/78  Pulse: 63  Temp: 98 F (36.7 C)  TempSrc: Oral  Resp: 18  Height: 5\' 5"  (1.651 m)  Weight: 157 lb 4.8 oz (71.351 kg)  SpO2: 97%    Body mass index is 26.18 kg/(m^2).  Physical Exam   Constitutional: Patient appears well-developed and well-nourished, overweight.  No distress.  HEENT: head atraumatic, normocephalic, pupils equal and reactive to light,  neck supple, throat within normal limits Cardiovascular: Normal rate, regular rhythm and normal heart sounds.  No murmur heard. No BLE edema. Pulmonary/Chest: Effort normal and breath sounds normal. No respiratory distress. Abdominal: Soft.  There is no tenderness. Psychiatric: Patient has a normal mood and affect. behavior is normal. Judgment and thought content normal   PHQ2/9: Depression screen PHQ 2/9 02/04/2015  Decreased Interest 0  Down, Depressed, Hopeless 0  PHQ - 2 Score 0    Fall Risk: Fall Risk  02/04/2015  Falls in the past year? No    Functional Status Survey: Is the patient deaf or have difficulty hearing?: No Does the patient have difficulty seeing, even when wearing glasses/contacts?: Yes (contacts) Does the patient have difficulty concentrating, remembering, or making decisions?: No Does the patient have difficulty walking or  climbing stairs?: No Does the patient have difficulty dressing or bathing?: No Does the patient have difficulty doing errands alone such as visiting a doctor's office or shopping?: No    Assessment & Plan  1. Insomnia  Continue Alprazolam   2. Needs flu shot  - Flu Vaccine QUAD 36+ mos PF IM (Fluarix & Fluzone Quad PF)  3. H/O gastric bypass  Recheck labs  4. Hyperlipidemia  - atorvastatin (LIPITOR) 40 MG tablet; Take 1 tablet (40 mg total) by mouth every evening.  Dispense: 90 tablet; Refill: 1 - Lipid panel  5. Hypertension, benign  Doing well on medication  - atenolol (TENORMIN) 50 MG tablet; Take 1 tablet (50 mg total) by mouth daily.  Dispense: 90 tablet; Refill: 1 - lisinopril (PRINIVIL,ZESTRIL) 20 MG tablet; Take 1 tablet (20 mg total) by mouth daily.  Dispense: 90 tablet; Refill: 1 -  Comprehensive metabolic panel  6. TMJ (temporomandibular joint disorder)  Using a mouth guard and is doing better  7. Metabolic syndrome   recheck labs  8. Depression, major, single episode, mild (HCC)  - ALPRAZolam (XANAX) 0.5 MG tablet; Take 1 tablet (0.5 mg total) by mouth 2 (two) times daily as needed for anxiety.  Dispense: 60 tablet; Refill: 2 - buPROPion (WELLBUTRIN XL) 150 MG 24 hr tablet; TAKE ONE TABLET BY MOUTH ONCE DAILY FOR DEPRESSION  Dispense: 90 tablet; Refill: 1  9. Postsurgical dumping syndrome  - CBC with Differential/Platelet  10. Vitamin D deficiency  - Vit D  25 hydroxy (rtn osteoporosis monitoring)  11. Vitamin B12 deficiency  - Vitamin B12  12. Breast cancer screening  - MM Digital Screening; Future  13. Hyperglycemia  - Hemoglobin A1c   14. Need for vaccination for Strep pneumoniae  - Pneumococcal polysaccharide vaccine 23-valent greater than or equal to 2yo subcutaneous/IM

## 2015-02-05 LAB — CBC WITH DIFFERENTIAL/PLATELET
BASOS ABS: 0.1 10*3/uL (ref 0.0–0.2)
Basos: 1 %
EOS (ABSOLUTE): 0.2 10*3/uL (ref 0.0–0.4)
Eos: 3 %
HEMATOCRIT: 34 % (ref 34.0–46.6)
HEMOGLOBIN: 11.4 g/dL (ref 11.1–15.9)
IMMATURE GRANULOCYTES: 0 %
Immature Grans (Abs): 0 10*3/uL (ref 0.0–0.1)
LYMPHS ABS: 2.2 10*3/uL (ref 0.7–3.1)
Lymphs: 31 %
MCH: 28.7 pg (ref 26.6–33.0)
MCHC: 33.5 g/dL (ref 31.5–35.7)
MCV: 86 fL (ref 79–97)
MONOCYTES: 9 %
MONOS ABS: 0.6 10*3/uL (ref 0.1–0.9)
NEUTROS PCT: 56 %
Neutrophils Absolute: 3.9 10*3/uL (ref 1.4–7.0)
Platelets: 280 10*3/uL (ref 150–379)
RBC: 3.97 x10E6/uL (ref 3.77–5.28)
RDW: 13.9 % (ref 12.3–15.4)
WBC: 7 10*3/uL (ref 3.4–10.8)

## 2015-02-05 LAB — LIPID PANEL
CHOLESTEROL TOTAL: 115 mg/dL (ref 100–199)
Chol/HDL Ratio: 1.8 ratio units (ref 0.0–4.4)
HDL: 63 mg/dL (ref >39)
LDL Calculated: 37 mg/dL (ref 0–99)
TRIGLYCERIDES: 77 mg/dL (ref 0–149)
VLDL Cholesterol Cal: 15 mg/dL (ref 5–40)

## 2015-02-05 LAB — COMPREHENSIVE METABOLIC PANEL
ALT: 31 IU/L (ref 0–32)
AST: 26 IU/L (ref 0–40)
Albumin/Globulin Ratio: 1.5 (ref 1.1–2.5)
Albumin: 4 g/dL (ref 3.6–4.8)
Alkaline Phosphatase: 80 IU/L (ref 39–117)
BUN/Creatinine Ratio: 16 (ref 11–26)
BUN: 13 mg/dL (ref 8–27)
Bilirubin Total: 0.5 mg/dL (ref 0.0–1.2)
CO2: 25 mmol/L (ref 18–29)
Calcium: 9.2 mg/dL (ref 8.7–10.3)
Chloride: 103 mmol/L (ref 97–108)
Creatinine, Ser: 0.8 mg/dL (ref 0.57–1.00)
GFR calc Af Amer: 89 mL/min/{1.73_m2} (ref >59)
GFR calc non Af Amer: 77 mL/min/{1.73_m2} (ref >59)
GLUCOSE: 87 mg/dL (ref 65–99)
Globulin, Total: 2.7 g/dL (ref 1.5–4.5)
POTASSIUM: 4.5 mmol/L (ref 3.5–5.2)
Sodium: 142 mmol/L (ref 134–144)
TOTAL PROTEIN: 6.7 g/dL (ref 6.0–8.5)

## 2015-02-05 LAB — VITAMIN D 25 HYDROXY (VIT D DEFICIENCY, FRACTURES): Vit D, 25-Hydroxy: 30.1 ng/mL (ref 30.0–100.0)

## 2015-02-05 LAB — HEMOGLOBIN A1C
ESTIMATED AVERAGE GLUCOSE: 128 mg/dL
Hgb A1c MFr Bld: 6.1 % — ABNORMAL HIGH (ref 4.8–5.6)

## 2015-02-05 LAB — VITAMIN B12: Vitamin B-12: 351 pg/mL (ref 211–946)

## 2015-02-17 ENCOUNTER — Ambulatory Visit: Payer: Medicare Other

## 2015-02-18 ENCOUNTER — Ambulatory Visit
Admission: RE | Admit: 2015-02-18 | Discharge: 2015-02-18 | Disposition: A | Discharge status: Home / Self Care | Payer: Medicare Other | Source: Ambulatory Visit | Attending: Family Medicine | Admitting: Family Medicine

## 2015-02-18 ENCOUNTER — Other Ambulatory Visit: Payer: Self-pay | Admitting: Family Medicine

## 2015-02-18 DIAGNOSIS — Z1231 Encounter for screening mammogram for malignant neoplasm of breast: Secondary | ICD-10-CM | POA: Insufficient documentation

## 2015-02-18 DIAGNOSIS — Z1239 Encounter for other screening for malignant neoplasm of breast: Secondary | ICD-10-CM

## 2015-04-03 ENCOUNTER — Other Ambulatory Visit: Payer: Self-pay | Admitting: Family Medicine

## 2015-04-03 NOTE — Telephone Encounter (Signed)
Patient requesting refill. 

## 2015-07-02 ENCOUNTER — Other Ambulatory Visit: Payer: Self-pay

## 2015-07-02 DIAGNOSIS — I10 Essential (primary) hypertension: Secondary | ICD-10-CM

## 2015-07-02 DIAGNOSIS — E785 Hyperlipidemia, unspecified: Secondary | ICD-10-CM

## 2015-07-02 MED ORDER — LISINOPRIL 20 MG PO TABS: 20.0000 mg | ORAL_TABLET | Freq: Every day | ORAL | Status: DC

## 2015-07-02 MED ORDER — ATENOLOL 50 MG PO TABS: 50.0000 mg | ORAL_TABLET | Freq: Every day | ORAL | Status: DC

## 2015-07-02 MED ORDER — ATORVASTATIN CALCIUM 40 MG PO TABS: 40.0000 mg | ORAL_TABLET | Freq: Every evening | ORAL | Status: DC

## 2015-07-02 MED ORDER — CITALOPRAM HYDROBROMIDE 20 MG PO TABS: 20.0000 mg | ORAL_TABLET | Freq: Every day | ORAL | Status: DC

## 2015-07-02 NOTE — Telephone Encounter (Signed)
Patient requesting refill. 

## 2015-07-06 ENCOUNTER — Other Ambulatory Visit: Payer: Self-pay | Admitting: Family Medicine

## 2015-07-07 ENCOUNTER — Other Ambulatory Visit: Payer: Self-pay

## 2015-07-07 DIAGNOSIS — I10 Essential (primary) hypertension: Secondary | ICD-10-CM

## 2015-07-07 DIAGNOSIS — F320 Major depressive disorder, single episode, mild: Secondary | ICD-10-CM

## 2015-07-07 DIAGNOSIS — E785 Hyperlipidemia, unspecified: Secondary | ICD-10-CM

## 2015-07-07 MED ORDER — LISINOPRIL 20 MG PO TABS
20.0000 mg | ORAL_TABLET | Freq: Every day | ORAL | Status: DC
Start: 2015-07-07 — End: 2015-08-10

## 2015-07-07 MED ORDER — CITALOPRAM HYDROBROMIDE 20 MG PO TABS: 20.0000 mg | ORAL_TABLET | Freq: Every day | ORAL | Status: DC

## 2015-07-07 MED ORDER — ATORVASTATIN CALCIUM 40 MG PO TABS: 40.0000 mg | ORAL_TABLET | Freq: Every evening | ORAL | Status: DC

## 2015-07-07 MED ORDER — ATENOLOL 50 MG PO TABS: 50.0000 mg | ORAL_TABLET | Freq: Every day | ORAL | Status: DC

## 2015-07-07 MED ORDER — ALPRAZOLAM 0.5 MG PO TABS: 0.5000 mg | ORAL_TABLET | Freq: Two times a day (BID) | ORAL | Status: DC | PRN

## 2015-07-07 NOTE — Telephone Encounter (Signed)
Spoke with Misty with [facility] and she told me that this patient was unaware of her medical benefits. She informed this patient that she can get a 90-day supply of her atenolol, atorvastatin, xanax (if manually faxed), celexa and lisinopril for $0 co-pay, so she needs a new rx sent in for the above medications.  Refill request was sent to Dr. Steele Sizer for approval and submission.

## 2015-07-09 DIAGNOSIS — L298 Other pruritus: Secondary | ICD-10-CM | POA: Diagnosis not present

## 2015-07-09 DIAGNOSIS — L259 Unspecified contact dermatitis, unspecified cause: Secondary | ICD-10-CM | POA: Diagnosis not present

## 2015-07-09 DIAGNOSIS — N898 Other specified noninflammatory disorders of vagina: Secondary | ICD-10-CM | POA: Diagnosis not present

## 2015-07-14 ENCOUNTER — Telehealth: Payer: Self-pay | Admitting: Family Medicine

## 2015-07-14 NOTE — Telephone Encounter (Signed)
Patient stated she never received her Alprazolam from [facility] and requested her prescription be sent into [facility] in [place]. I called in her 1 month supply to this pharmacy and told her once she comes in for her appointment you would refill all her maintenance medication then to [facility].

## 2015-07-14 NOTE — Telephone Encounter (Signed)
Patient has some questions regarding her medications.

## 2015-08-10 ENCOUNTER — Ambulatory Visit (INDEPENDENT_AMBULATORY_CARE_PROVIDER_SITE_OTHER): Payer: Commercial Managed Care - HMO | Admitting: Family Medicine

## 2015-08-10 ENCOUNTER — Encounter: Payer: Self-pay | Admitting: Family Medicine

## 2015-08-10 VITALS — BP 138/64 | HR 60 | Temp 98.0°F | Resp 16 | Ht 65.0 in | Wt 163.7 lb

## 2015-08-10 DIAGNOSIS — G4700 Insomnia, unspecified: Secondary | ICD-10-CM | POA: Diagnosis not present

## 2015-08-10 DIAGNOSIS — E8881 Metabolic syndrome: Secondary | ICD-10-CM

## 2015-08-10 DIAGNOSIS — Z9889 Other specified postprocedural states: Secondary | ICD-10-CM

## 2015-08-10 DIAGNOSIS — E538 Deficiency of other specified B group vitamins: Secondary | ICD-10-CM

## 2015-08-10 DIAGNOSIS — E559 Vitamin D deficiency, unspecified: Secondary | ICD-10-CM

## 2015-08-10 DIAGNOSIS — I10 Essential (primary) hypertension: Secondary | ICD-10-CM | POA: Diagnosis not present

## 2015-08-10 DIAGNOSIS — D692 Other nonthrombocytopenic purpura: Secondary | ICD-10-CM | POA: Diagnosis not present

## 2015-08-10 DIAGNOSIS — E785 Hyperlipidemia, unspecified: Secondary | ICD-10-CM | POA: Diagnosis not present

## 2015-08-10 DIAGNOSIS — Z0000 Encounter for general adult medical examination without abnormal findings: Secondary | ICD-10-CM

## 2015-08-10 DIAGNOSIS — F320 Major depressive disorder, single episode, mild: Secondary | ICD-10-CM

## 2015-08-10 DIAGNOSIS — Z9884 Bariatric surgery status: Secondary | ICD-10-CM

## 2015-08-10 DIAGNOSIS — Z01419 Encounter for gynecological examination (general) (routine) without abnormal findings: Secondary | ICD-10-CM

## 2015-08-10 MED ORDER — B-12 500 MCG SL SUBL: 1000.0000 ug | SUBLINGUAL_TABLET | Freq: Every day | SUBLINGUAL | Status: DC

## 2015-08-10 MED ORDER — CITALOPRAM HYDROBROMIDE 20 MG PO TABS: 20.0000 mg | ORAL_TABLET | Freq: Every day | ORAL | Status: DC

## 2015-08-10 MED ORDER — VITAMIN D (ERGOCALCIFEROL) 1.25 MG (50000 UNIT) PO CAPS: 50000.0000 [IU] | ORAL_CAPSULE | ORAL | Status: DC

## 2015-08-10 MED ORDER — ATENOLOL 50 MG PO TABS: 50.0000 mg | ORAL_TABLET | Freq: Every day | ORAL | Status: DC

## 2015-08-10 MED ORDER — BUPROPION HCL ER (SR) 150 MG PO TB12: 150.0000 mg | ORAL_TABLET | Freq: Two times a day (BID) | ORAL | Status: DC

## 2015-08-10 MED ORDER — ATORVASTATIN CALCIUM 40 MG PO TABS
40.0000 mg | ORAL_TABLET | Freq: Every evening | ORAL | Status: DC
Start: 2015-08-10 — End: 2016-01-27

## 2015-08-10 MED ORDER — VITAMIN B-12 1000 MCG PO TABS: 1000.0000 ug | ORAL_TABLET | Freq: Every day | ORAL | Status: DC

## 2015-08-10 MED ORDER — LISINOPRIL 20 MG PO TABS: 20.0000 mg | ORAL_TABLET | Freq: Every day | ORAL | Status: DC

## 2015-08-10 NOTE — Addendum Note (Signed)
Addended by: Inda Coke on: 08/10/2015 03:55 PM   Modules accepted: Orders, Medications

## 2015-08-10 NOTE — Progress Notes (Signed)
Name: Jazminne Mechling [facility]   MRN: [id number]    DOB: 1951-02-14   Date:08/10/2015       Progress Note  Subjective  Chief Complaint  Chief Complaint  Patient presents with  . Annual Exam  . Medication Refill    6 month F/U  . Hypertension  . Hyperlipidemia  . Depression    Stable with medications  . Insomnia    Controlled, sleeps on average 8 hours nightly    HPI   Functional ability/safety issues: No Issues Hearing issues: Addressed  Activities of daily living: Discussed Home safety issues: No Issues  End Of Life Planning: Offered verbal information regarding advanced directives, healthcare power of attorney.  Preventative care, Health maintenance, Preventative health measures discussed.  Preventative screenings discussed today: lab work, colonoscopy,  mammogram, DEXA - she wants to hold off.  Low Dose CT Chest recommended if Age 60-80 years, 30 pack-year currently smoking OR have quit w/in 15years.   Lifestyle risk factor issued reviewed: Diet, exercise, weight management, advised patient smoking is not healthy, nutrition/diet.  Preventative health measures discussed (5-10 year plan).  Reviewed and recommended vaccinations: - Pneumovax  - Prevnar  - Annual Influenza - Zostavax - Tdap   Depression screening: Done Fall risk screening: Done Discuss ADLs/IADLs: Done  Current medical providers: See HPI  Other health risk factors identified this visit: No other issues Cognitive impairment issues: None identified  All above discussed with patient. Appropriate education, counseling and referral will be made based upon the above.   Well Woman: she had a hysterectomy for benign, recent mammogram is normal. She is seeing GYN for vulvar irritation, likely from atrophy, pathology report not available for review.   HTN: she is taking medication and denies side effects. No chest pain or palpation  Hyperlipidemia: taking Lipitor and denies side effects of medication.    Depression Mild single episode: she has been on medication for years. She started taking medication when she had an argument with her sister years ago, they were getting along again. They went to the beach as an entire family and since than they have not been talking again. She retired last August but during the winter she decided to return to her job. She gets along well with her husband. She denies crying spells or anhedonia. She is worried about her adult son - recently diagnosed with Schizophrenia.   Insomnia: taking Alprazolam prn qhs for sleep and sometimes during the day when she is very upset. It helps her fall and stay asleep  Metabolic Syndrome: last Q000111Q 6.1 , she denies polyphagia, polydipsia or polyuria.  Gastric bypass: she is not taking Vitamin D but has been taking B12 supplementation, we will check for anemia, denies fatigue  Dumping syndrome: since bowel obstruction and partial rescection, taking Imodium prn, discussed Questran but she would like to hold off  Patient Active Problem List   Diagnosis Date Noted  . Hyperlipidemia 02/04/2015  . History of small bowel obstruction 02/04/2015  . Hypertension, benign 02/04/2015  . DDD (degenerative disc disease), cervical 02/04/2015  . 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  . Insomnia 02/04/2015  . Osteoarthritis of finger of right hand 02/04/2015  . Metabolic syndrome 0000000  . Depression, major, single episode, mild ([place]) 02/04/2015  . 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 Vitamin D deficiency 02/04/2015  . Vitamin B12 deficiency 02/04/2015  . H/O gastric bypass     Past Surgical History  Procedure Laterality Date  . Gastric bypass  2009  . Appendectomy    .  Abdominal hysterectomy    . Laparoscopy N/A 01/05/2014    Procedure: LAPAROSCOPY DIAGNOSTIC CONVERTED TO  LAPAROTOMY ILECTOMY;  Surgeon: Erroll Luna, MD;  Location: [facility];  Service: General;  Laterality: N/A;    Family History   Problem Relation Age of Onset  . Heart disease Mother   . CAD Mother   . Kidney disease Mother   . Heart attack Father   . Obesity Sister   . Heart attack Brother     after surgery    Social History   Social History  . Marital Status: Single    Spouse Name: N/A  . Number of Children: N/A  . Years of Education: N/A   Occupational History  . Not on file.   Social History Main Topics  . Smoking status: Never Smoker   . Smokeless tobacco: Never Used  . Alcohol Use: 0.0 oz/week    0 Standard drinks or equivalent per week     Comment: occasionally drink  . Drug Use: No  . Sexual Activity: No   Other Topics Concern  . Not on file   Social History Narrative     Current outpatient prescriptions:  .  acetaminophen (TYLENOL) 325 MG tablet, Take 2 tablets (650 mg total) by mouth every 6 (six) hours as needed for mild pain (or Temp > 100)., Disp: , Rfl:  .  ALPRAZolam (XANAX) 0.5 MG tablet, Take 1 tablet (0.5 mg total) by mouth 2 (two) times daily as needed for anxiety., Disp: 60 tablet, Rfl: 0 .  atenolol (TENORMIN) 50 MG tablet, Take 1 tablet (50 mg total) by mouth daily., Disp: 90 tablet, Rfl: 1 .  atorvastatin (LIPITOR) 40 MG tablet, Take 1 tablet (40 mg total) by mouth every evening., Disp: 90 tablet, Rfl: 1 .  citalopram (CELEXA) 20 MG tablet, Take 1 tablet (20 mg total) by mouth daily., Disp: 90 tablet, Rfl: 1 .  lisinopril (PRINIVIL,ZESTRIL) 20 MG tablet, Take 1 tablet (20 mg total) by mouth daily., Disp: 90 tablet, Rfl: 1 .  buPROPion (WELLBUTRIN SR) 150 MG 12 hr tablet, Take 1 tablet (150 mg total) by mouth 2 (two) times daily., Disp: 180 tablet, Rfl: 1 .  Cyanocobalamin (B-12) 500 MCG SUBL, Place 1,000 mcg under the tongue daily., Disp: 180 tablet, Rfl: 1 .  Vitamin D, Ergocalciferol, (DRISDOL) 50000 units CAPS capsule, Take 1 capsule (50,000 Units total) by mouth every 7 (seven) days., Disp: 12 capsule, Rfl: 1  No Known Allergies   ROS  Constitutional: Negative  for fever, positive for weight change.  Respiratory: Negative for cough and shortness of breath.   Cardiovascular: Negative for chest pain or palpitations.  Gastrointestinal: Negative for abdominal pain, still has dumping  Musculoskeletal: Negative for gait problem or joint swelling.  Skin: Negative for rash.  Neurological: Negative for dizziness or headache.  No other specific complaints in a complete review of systems (except as listed in HPI above).  Objective  Filed Vitals:   08/10/15 0827  BP: 138/64  Pulse: 60  Temp: 98 F (36.7 C)  TempSrc: Oral  Resp: 16  Height: 5\' 5"  (1.651 m)  Weight: 163 lb 11.2 oz (74.254 kg)  SpO2: 98%    Body mass index is 27.24 kg/(m^2).  Physical Exam   Constitutional: Patient appears well-developed and well-nourished. No distress.  HENT: Head: Normocephalic and atraumatic. Ears: B TMs ok, no erythema or effusion; Nose: Nose normal. Mouth/Throat: Oropharynx is clear and moist. No oropharyngeal exudate.  Eyes: Conjunctivae and EOM are  normal. Pupils are equal, round, and reactive to light. No scleral icterus.  Neck: Normal range of motion. Neck supple. No JVD present. No thyromegaly present.  Cardiovascular: Normal rate, regular rhythm and normal heart sounds.  No murmur heard. No BLE edema. Pulmonary/Chest: Effort normal and breath sounds normal. No respiratory distress. Abdominal: Soft. Bowel sounds are normal, no distension. There is no tenderness. no masses Breast: no lumps or masses, no nipple discharge or rashes FEMALE GENITALIA:  Not done RECTAL: not done Musculoskeletal: Normal range of motion, no joint effusions. No gross deformities Neurological: he is alert and oriented to person, place, and time. No cranial nerve deficit. Coordination, balance, strength, speech and gait are normal.  Skin: Skin is warm and dry. No rash noted. No erythema.  Psychiatric: Patient has a normal mood and affect. behavior is normal. Judgment and thought  content normal.  PHQ2/9: Depression screen [facility] 2/9 08/10/2015 02/04/2015  Decreased Interest 0 0  Down, Depressed, Hopeless 0 0  PHQ - 2 Score 0 0     Fall Risk: Fall Risk  08/10/2015 02/04/2015  Falls in the past year? No No    Functional Status Survey: Is the patient deaf or have difficulty hearing?: No Does the patient have difficulty seeing, even when wearing glasses/contacts?: No Does the patient have difficulty concentrating, remembering, or making decisions?: No Does the patient have difficulty walking or climbing stairs?: No Does the patient have difficulty dressing or bathing?: No Does the patient have difficulty doing errands alone such as visiting a doctor's office or shopping?: No    Assessment & Plan  1. Well woman exam  Discussed importance of 150 minutes of physical activity weekly, eat two servings of fish weekly, eat one serving of tree nuts ( cashews, pistachios, pecans, almonds.Marland Kitchen) every other day, eat 6 servings of fruit/vegetables daily and drink plenty of water and avoid sweet beverages.   2. Vitamin D deficiency  - Vitamin D, Ergocalciferol, (DRISDOL) 50000 units CAPS capsule; Take 1 capsule (50,000 Units total) by mouth every 7 (seven) days.  Dispense: 12 capsule; Refill: 1  3. Metabolic syndrome  Recheck labs next visit  4. Insomnia  Taking prn medication   5. Hyperlipidemia  - atorvastatin (LIPITOR) 40 MG tablet; Take 1 tablet (40 mg total) by mouth every evening.  Dispense: 90 tablet; Refill: 1  6. H/O gastric bypass  Needs to resume Vitamin D supplementation   7. Hypertension, benign  - atenolol (TENORMIN) 50 MG tablet; Take 1 tablet (50 mg total) by mouth daily.  Dispense: 90 tablet; Refill: 1 - lisinopril (PRINIVIL,ZESTRIL) 20 MG tablet; Take 1 tablet (20 mg total) by mouth daily.  Dispense: 90 tablet; Refill: 1  8. Depression, major, single episode, mild (HCC)  - citalopram (CELEXA) 20 MG tablet; Take 1 tablet (20 mg total) by  mouth daily.  Dispense: 90 tablet; Refill: 1 - buPROPion (WELLBUTRIN SR) 150 MG 12 hr tablet; Take 1 tablet (150 mg total) by mouth 2 (two) times daily.  Dispense: 180 tablet; Refill: 1  9. Vitamin B12 deficiency  - Cyanocobalamin (B-12) 500 MCG SUBL; Place 1,000 mcg under the tongue daily.  Dispense: 180 tablet; Refill: 1   10. Senile purpura (HCC)  Left arm

## 2015-08-13 DIAGNOSIS — R300 Dysuria: Secondary | ICD-10-CM | POA: Diagnosis not present

## 2015-08-13 DIAGNOSIS — N7689 Other specified inflammation of vagina and vulva: Secondary | ICD-10-CM | POA: Diagnosis not present

## 2015-08-24 ENCOUNTER — Other Ambulatory Visit: Payer: Self-pay

## 2015-08-24 DIAGNOSIS — F320 Major depressive disorder, single episode, mild: Secondary | ICD-10-CM

## 2015-08-24 NOTE — Telephone Encounter (Signed)
Patient called and stated Dr. Ancil Boozer informed her to call when she was out of her Xanax for a refill. Please send into [facility], thanks!

## 2015-09-01 ENCOUNTER — Other Ambulatory Visit: Payer: Self-pay

## 2015-09-01 DIAGNOSIS — F320 Major depressive disorder, single episode, mild: Secondary | ICD-10-CM

## 2015-09-01 MED ORDER — ALPRAZOLAM 0.5 MG PO TABS: 0.5000 mg | ORAL_TABLET | Freq: Two times a day (BID) | ORAL | Status: DC | PRN

## 2015-09-01 NOTE — Telephone Encounter (Signed)
Patient was told to call when she was low on prescription but Dr. Ancil Boozer denied medication refill on 08/24/15 and patient was confused why. Spoke with Dr. Ancil Boozer verbally on 09/01/15 and states medication should be lasting patient longer than it is for her, and the 60 pills are only to be taken on a "as needed basis". Dr. Ancil Boozer ok the medication refill for 60 pills of Alprazolam-1 tablet bid prn with 0 refills with the pharmacy to be aware medication must last patient for 3 months, patient was notified and states she was never told this before. I called in the prescription to [facility] [phone] for the patient and informed her to discuss this with Dr. Ancil Boozer during her next visit, due to these were the verbally instructions I was given by Dr. Ancil Boozer.

## 2015-09-17 ENCOUNTER — Other Ambulatory Visit: Payer: Self-pay

## 2015-09-17 DIAGNOSIS — F320 Major depressive disorder, single episode, mild: Secondary | ICD-10-CM

## 2015-09-17 MED ORDER — BUPROPION HCL ER (XL) 150 MG PO TB24: ORAL_TABLET | ORAL | Status: DC

## 2015-09-17 NOTE — Telephone Encounter (Signed)
The correct order was faxed to [phone].

## 2015-10-09 ENCOUNTER — Other Ambulatory Visit: Payer: Self-pay

## 2015-10-09 DIAGNOSIS — F320 Major depressive disorder, single episode, mild: Secondary | ICD-10-CM

## 2015-10-09 NOTE — Telephone Encounter (Signed)
Patient called asking for a refill of her Xanax and states for years she has always taken them as one tablet twice daily and does not understand why this last prescription was called in with 60 pills. Informed patient she just received the prescription on 09/01/14 with 60 pills with documentation stating it must last her 3 months. Dr. Ancil Boozer also informed patient to make her prescription last 3 months and only take it as needed.

## 2015-10-10 NOTE — Telephone Encounter (Signed)
Please call pharmacy to confirm that she has always been taking it 2 times daily , if so I will call in 2 more prescriptions until her follow up.

## 2015-10-12 ENCOUNTER — Other Ambulatory Visit: Payer: Self-pay | Admitting: Family Medicine

## 2015-10-12 DIAGNOSIS — F320 Major depressive disorder, single episode, mild: Secondary | ICD-10-CM

## 2015-10-12 MED ORDER — ALPRAZOLAM 0.5 MG PO TABS: 0.5000 mg | ORAL_TABLET | Freq: Two times a day (BID) | ORAL | Status: DC | PRN

## 2015-10-12 NOTE — Telephone Encounter (Signed)
Patient takes one every night and then one during the day as needed.

## 2015-10-12 NOTE — Addendum Note (Signed)
Addended by: Inda Coke on: 10/12/2015 04:45 PM   Modules accepted: Orders

## 2015-10-13 ENCOUNTER — Other Ambulatory Visit: Payer: Self-pay | Admitting: Family Medicine

## 2015-10-13 ENCOUNTER — Telehealth: Payer: Self-pay | Admitting: Family Medicine

## 2015-10-13 NOTE — Telephone Encounter (Signed)
PT SAID THAT SHE SPOKE WITH YOU YESTERDAY AND THAT THE DR WAS GOING TO CALL IN HER RX FOR HER ANXIETY ( ALPRAZOLAM) . SHE WANTS TO REMIND YOU THAT SHE IS [place] FOR 3 WKS LEAVING TOMORROW. NEEDS TO GO TO [facility] ON [address] IN [place] NOT MAIL ORDER.

## 2015-12-31 ENCOUNTER — Other Ambulatory Visit: Payer: Self-pay | Admitting: Family Medicine

## 2015-12-31 MED ORDER — METOPROLOL SUCCINATE ER 50 MG PO TB24: 50.0000 mg | ORAL_TABLET | Freq: Every day | ORAL | 1 refills | Status: DC

## 2016-01-08 ENCOUNTER — Telehealth: Payer: Self-pay | Admitting: Family Medicine

## 2016-01-09 NOTE — Telephone Encounter (Signed)
I changed it to Metoprolol on 0907/2017

## 2016-01-19 ENCOUNTER — Other Ambulatory Visit: Payer: Self-pay | Admitting: Family Medicine

## 2016-01-19 DIAGNOSIS — I10 Essential (primary) hypertension: Secondary | ICD-10-CM

## 2016-01-19 DIAGNOSIS — F320 Major depressive disorder, single episode, mild: Secondary | ICD-10-CM

## 2016-01-19 NOTE — Telephone Encounter (Signed)
Patient requesting refill of Celexa and Lisinopril to [facility].

## 2016-01-27 ENCOUNTER — Other Ambulatory Visit: Payer: Self-pay | Admitting: Family Medicine

## 2016-01-27 DIAGNOSIS — E785 Hyperlipidemia, unspecified: Secondary | ICD-10-CM

## 2016-01-27 NOTE — Telephone Encounter (Signed)
Patient requesting refill of Atorvastatin to [facility].

## 2016-02-09 ENCOUNTER — Ambulatory Visit (INDEPENDENT_AMBULATORY_CARE_PROVIDER_SITE_OTHER): Payer: Commercial Managed Care - HMO | Admitting: Family Medicine

## 2016-02-09 ENCOUNTER — Encounter: Payer: Self-pay | Admitting: Family Medicine

## 2016-02-09 VITALS — BP 124/88 | HR 65 | Temp 98.1°F | Wt 170.2 lb

## 2016-02-09 DIAGNOSIS — E559 Vitamin D deficiency, unspecified: Secondary | ICD-10-CM | POA: Diagnosis not present

## 2016-02-09 DIAGNOSIS — F5101 Primary insomnia: Secondary | ICD-10-CM | POA: Diagnosis not present

## 2016-02-09 DIAGNOSIS — D692 Other nonthrombocytopenic purpura: Secondary | ICD-10-CM | POA: Diagnosis not present

## 2016-02-09 DIAGNOSIS — Z23 Encounter for immunization: Secondary | ICD-10-CM | POA: Diagnosis not present

## 2016-02-09 DIAGNOSIS — K911 Postgastric surgery syndromes: Secondary | ICD-10-CM | POA: Diagnosis not present

## 2016-02-09 DIAGNOSIS — R739 Hyperglycemia, unspecified: Secondary | ICD-10-CM | POA: Diagnosis not present

## 2016-02-09 DIAGNOSIS — I10 Essential (primary) hypertension: Secondary | ICD-10-CM | POA: Diagnosis not present

## 2016-02-09 DIAGNOSIS — Z9889 Other specified postprocedural states: Secondary | ICD-10-CM | POA: Diagnosis not present

## 2016-02-09 DIAGNOSIS — Z9884 Bariatric surgery status: Secondary | ICD-10-CM

## 2016-02-09 DIAGNOSIS — E8881 Metabolic syndrome: Secondary | ICD-10-CM | POA: Diagnosis not present

## 2016-02-09 DIAGNOSIS — E538 Deficiency of other specified B group vitamins: Secondary | ICD-10-CM | POA: Diagnosis not present

## 2016-02-09 DIAGNOSIS — E782 Mixed hyperlipidemia: Secondary | ICD-10-CM

## 2016-02-09 DIAGNOSIS — F320 Major depressive disorder, single episode, mild: Secondary | ICD-10-CM

## 2016-02-09 LAB — COMPLETE METABOLIC PANEL WITH GFR
ALT: 21 U/L (ref 6–29)
AST: 23 U/L (ref 10–35)
Albumin: 3.5 g/dL — ABNORMAL LOW (ref 3.6–5.1)
Alkaline Phosphatase: 67 U/L (ref 33–130)
BUN: 10 mg/dL (ref 7–25)
CALCIUM: 8.9 mg/dL (ref 8.6–10.4)
CHLORIDE: 104 mmol/L (ref 98–110)
CO2: 28 mmol/L (ref 20–31)
CREATININE: 0.69 mg/dL (ref 0.50–0.99)
GFR, Est African American: >89 mL/min (ref >=60)
GFR, Est Non African American: >89 mL/min (ref >=60)
Glucose, Bld: 82 mg/dL (ref 65–99)
Potassium: 4 mmol/L (ref 3.5–5.3)
Sodium: 141 mmol/L (ref 135–146)
TOTAL PROTEIN: 6.1 g/dL (ref 6.1–8.1)
Total Bilirubin: 0.6 mg/dL (ref 0.2–1.2)

## 2016-02-09 LAB — LIPID PANEL
CHOL/HDL RATIO: 1.8 ratio (ref <=5.0)
CHOLESTEROL: 111 mg/dL — AB (ref 125–200)
HDL: 61 mg/dL (ref >=46)
LDL Cholesterol: 38 mg/dL (ref <130)
TRIGLYCERIDES: 61 mg/dL (ref <150)
VLDL: 12 mg/dL (ref <30)

## 2016-02-09 LAB — CBC WITH DIFFERENTIAL/PLATELET
BASOS PCT: 1 %
Basophils Absolute: 76 cells/uL (ref 0–200)
EOS ABS: 152 {cells}/uL (ref 15–500)
Eosinophils Relative: 2 %
HCT: 32.2 % — ABNORMAL LOW (ref 35.0–45.0)
HEMOGLOBIN: 9.9 g/dL — AB (ref 11.7–15.5)
LYMPHS PCT: 25 %
Lymphs Abs: 1900 cells/uL (ref 850–3900)
MCH: 25.8 pg — AB (ref 27.0–33.0)
MCHC: 30.7 g/dL — ABNORMAL LOW (ref 32.0–36.0)
MCV: 84.1 fL (ref 80.0–100.0)
MONO ABS: 608 {cells}/uL (ref 200–950)
MPV: 9.7 fL (ref 7.5–12.5)
Monocytes Relative: 8 %
NEUTROS PCT: 64 %
Neutro Abs: 4864 cells/uL (ref 1500–7800)
PLATELETS: 293 10*3/uL (ref 140–400)
RBC: 3.83 MIL/uL (ref 3.80–5.10)
RDW: 14.8 % (ref 11.0–15.0)
WBC: 7.6 10*3/uL (ref 3.8–10.8)

## 2016-02-09 LAB — VITAMIN B12: VITAMIN B 12: 296 pg/mL (ref 200–1100)

## 2016-02-09 MED ORDER — ATORVASTATIN CALCIUM 40 MG PO TABS: 40.0000 mg | ORAL_TABLET | Freq: Every evening | ORAL | 1 refills | Status: DC

## 2016-02-09 MED ORDER — VITAMIN D 50 MCG (2000 UT) PO CAPS: 1.0000 | ORAL_CAPSULE | Freq: Every day | ORAL | 0 refills | Status: DC

## 2016-02-09 MED ORDER — CITALOPRAM HYDROBROMIDE 20 MG PO TABS: 20.0000 mg | ORAL_TABLET | Freq: Every day | ORAL | 1 refills | Status: DC

## 2016-02-09 MED ORDER — ALPRAZOLAM 0.5 MG PO TABS: 0.2500 mg | ORAL_TABLET | Freq: Every day | ORAL | 0 refills | Status: DC | PRN

## 2016-02-09 MED ORDER — ASPIRIN 81 MG PO CHEW: 81.0000 mg | CHEWABLE_TABLET | Freq: Every day | ORAL | 0 refills | Status: DC

## 2016-02-09 MED ORDER — TEMAZEPAM 15 MG PO CAPS: 15.0000 mg | ORAL_CAPSULE | Freq: Every evening | ORAL | 1 refills | Status: DC | PRN

## 2016-02-09 NOTE — Progress Notes (Signed)
Name: Alexandria Newman [facility]   MRN: [id number]    DOB: 05-21-1950   Date:02/09/2016       Progress Note  Subjective  Chief Complaint  Chief Complaint  Patient presents with  . Follow-up    HPI  HTN: she is taking medication and denies side effects. No chest pain or palpation  Hyperlipidemia: taking Lipitor and denies side effects of medication. No myalgias or chest pain.  Depression Mild single episode: she has been on medication for years. She started taking medication when she had an argument with her sister years ago . She gets along well with her husband. She denies crying spells but she has anhedonia. She is worried about her adult son - recently diagnosed with Schizophrenia. He is doing well in terms of Schizophrenia, however the entire family is stressed because his ex-girlfriend that had his child but he has not been proven to be the father.   Insomnia: taking Alprazolam prn qhs for sleep and sometimes during the day when she is very upset. It helps her fall and stay asleep  Discussed changing to Temazepam for sleep and she is willing to try it  Metabolic Syndrome: last [id number] 6.1 , she denies polyphagia, polydipsia . She always has polyuria. She has gained weight   Gastric bypass:  Almost 10 years ago, she is not taking Vitamin D but has been taking B12 supplementation but not on a regular basis, we will check for anemia. She has noticed increase in fatigue.   Dumping syndrome: since bowel obstruction and partial resection in 2015, 2 taking Imodium prn, we tried Questran but she could not tolerate the taste. She would like to see GI, but advised to discuss it with surgeon instead. She states Imodium has helped.   Senile Purpura: she still bruises on both arms, gave her reassurance   Patient Active Problem List   Diagnosis Date Noted  . Senile purpura ([place]) 08/10/2015  . Hyperlipidemia 02/04/2015  . History of small bowel obstruction 02/04/2015  . Hypertension,  benign 02/04/2015  . DDD (degenerative disc disease), cervical 02/04/2015  . 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  . Insomnia 02/04/2015  . Osteoarthritis of finger of right hand 02/04/2015  . Metabolic syndrome 0000000  . Depression, major, single episode, mild ([place]) 02/04/2015  . 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 Vitamin D deficiency 02/04/2015  . Vitamin B12 deficiency 02/04/2015  . H/O gastric bypass     Past Surgical History:  Procedure Laterality Date  . ABDOMINAL HYSTERECTOMY    . APPENDECTOMY    . GASTRIC BYPASS  2009  . LAPAROSCOPY N/A 01/05/2014   Procedure: LAPAROSCOPY DIAGNOSTIC CONVERTED TO  LAPAROTOMY ILECTOMY;  Surgeon: Erroll Luna, MD;  Location: [place];  Service: General;  Laterality: N/A;    Family History  Problem Relation Age of Onset  . Heart disease Mother   . CAD Mother   . Kidney disease Mother   . Heart attack Father   . Obesity Sister   . Heart attack Brother     after surgery    Social History   Social History  . Marital status: Single    Spouse name: N/A  . Number of children: N/A  . Years of education: N/A   Occupational History  . Not on file.   Social History Main Topics  . Smoking status: Never Smoker  . Smokeless tobacco: Never Used  . Alcohol use 0.0 oz/week     Comment: occasionally drink  . Drug use: No  .  Sexual activity: No   Other Topics Concern  . Not on file   Social History Narrative  . No narrative on file     Current Outpatient Prescriptions:  .  acetaminophen (TYLENOL) 325 MG tablet, Take 2 tablets (650 mg total) by mouth every 6 (six) hours as needed for mild pain (or Temp > 100)., Disp: , Rfl:  .  ALPRAZolam (XANAX) 0.5 MG tablet, Take 0.5-1 tablets (0.25-0.5 mg total) by mouth daily as needed for anxiety (Must last patient for 3 months)., Disp: 30 tablet, Rfl: 0 .  atorvastatin (LIPITOR) 40 MG tablet, Take 1 tablet (40 mg total) by mouth every evening., Disp: 90 tablet, Rfl: 1 .   buPROPion (WELLBUTRIN XL) 150 MG 24 hr tablet, TAKE 1 TABLET ONE TIME A DAY IN THE MORNING, Disp: 90 tablet, Rfl: 3 .  citalopram (CELEXA) 20 MG tablet, Take 1 tablet (20 mg total) by mouth daily., Disp: 90 tablet, Rfl: 1 .  lisinopril (PRINIVIL,ZESTRIL) 20 MG tablet, TAKE 1 TABLET EVERY DAY, Disp: 90 tablet, Rfl: 0 .  metoprolol succinate (TOPROL XL) 50 MG 24 hr tablet, Take 1 tablet (50 mg total) by mouth daily. Take with or immediately following a meal., Disp: 90 tablet, Rfl: 1 .  vitamin B-12 (CYANOCOBALAMIN) 1000 MCG tablet, Take 1 tablet (1,000 mcg total) by mouth daily., Disp: 90 tablet, Rfl: 1 .  Cholecalciferol (VITAMIN D) 2000 units CAPS, Take 1 capsule (2,000 Units total) by mouth daily., Disp: 30 capsule, Rfl: 0 .  temazepam (RESTORIL) 15 MG capsule, Take 1 capsule (15 mg total) by mouth at bedtime as needed for sleep., Disp: 90 capsule, Rfl: 1  No Known Allergies   ROS  Constitutional: Negative for fever or weight change.  Respiratory: Negative for cough and shortness of breath.   Cardiovascular: Negative for chest pain or palpitations.  Gastrointestinal: Negative for abdominal pain, no bowel changes.  Musculoskeletal: Negative for gait problem or joint swelling.  Skin: Negative for rash.  Neurological: Negative for dizziness or headache.  No other specific complaints in a complete review of systems (except as listed in HPI above).  Objective  Vitals:   02/09/16 0809  BP: 124/88  Pulse: 65  Temp: 98.1 F (36.7 C)  SpO2: 97%  Weight: 170 lb 3.2 oz (77.2 kg)    Body mass index is 28.32 kg/m.  Physical Exam  Constitutional: Patient appears well-developed and well-nourished. Obese No distress.  HEENT: head atraumatic, normocephalic, pupils equal and reactive to light,  neck supple, throat within normal limits Cardiovascular: Normal rate, regular rhythm and normal heart sounds.  No murmur heard. No BLE edema. Pulmonary/Chest: Effort normal and breath sounds normal.  No respiratory distress. Abdominal: Soft.  There is no tenderness. Psychiatric: Patient has a normal mood and affect. behavior is normal. Judgment and thought content normal.  PHQ2/9: Depression screen [facility] 2/9 02/09/2016 08/10/2015 02/04/2015  Decreased Interest 0 0 0  Down, Depressed, Hopeless 0 0 0  PHQ - 2 Score 0 0 0    Fall Risk: Fall Risk  02/09/2016 08/10/2015 02/04/2015  Falls in the past year? No No No    Functional Status Survey: Is the patient deaf or have difficulty hearing?: No Does the patient have difficulty seeing, even when wearing glasses/contacts?: Yes (contacts) Does the patient have difficulty concentrating, remembering, or making decisions?: No Does the patient have difficulty walking or climbing stairs?: No Does the patient have difficulty dressing or bathing?: No Does the patient have difficulty doing errands alone such  as visiting a doctor's office or shopping?: No    Assessment & Plan  1. Hypertension, benign  - COMPLETE METABOLIC PANEL WITH GFR - CBC with Differential/Platelet  2. Need for influenza vaccination  - Flu Vaccine QUAD 36+ mos PF IM (Fluarix & Fluzone Quad PF)  3. Metabolic syndrome  Recheck hgbA1C  4. Vitamin D deficiency  - Cholecalciferol (VITAMIN D) 2000 units CAPS; Take 1 capsule (2,000 Units total) by mouth daily.  Dispense: 30 capsule; Refill: 0 - VITAMIN D 25 Hydroxy (Vit-D Deficiency, Fractures)  5. Depression, major, single episode, mild (HCC)  - ALPRAZolam (XANAX) 0.5 MG tablet; Take 0.5-1 tablets (0.25-0.5 mg total) by mouth daily as needed for anxiety (Must last patient for 3 months).  Dispense: 30 tablet; Refill: 0 - citalopram (CELEXA) 20 MG tablet; Take 1 tablet (20 mg total) by mouth daily.  Dispense: 90 tablet; Refill: 1  6. Primary insomnia  - temazepam (RESTORIL) 15 MG capsule; Take 1 capsule (15 mg total) by mouth at bedtime as needed for sleep.  Dispense: 90 capsule; Refill: 1  7. Mixed  hyperlipidemia  - atorvastatin (LIPITOR) 40 MG tablet; Take 1 tablet (40 mg total) by mouth every evening.  Dispense: 90 tablet; Refill: 1 - Lipid panel  8. Senile purpura ([place])   9. Vitamin B12 deficiency  - Vitamin B12  10. Postsurgical dumping syndrome  - CBC with Differential/Platelet  11. History of gastric bypass  - CBC with Differential/Platelet  12. Hyperglycemia  - Hemoglobin A1c  13. Need for pneumococcal vaccination  - Pneumococcal conjugate vaccine 13-valent IM

## 2016-02-10 LAB — HEMOGLOBIN A1C
Hgb A1c MFr Bld: 5.6 % (ref <5.7)
Mean Plasma Glucose: 114 mg/dL

## 2016-02-10 LAB — VITAMIN D 25 HYDROXY (VIT D DEFICIENCY, FRACTURES): Vit D, 25-Hydroxy: 28 ng/mL — ABNORMAL LOW (ref 30–100)

## 2016-02-11 ENCOUNTER — Other Ambulatory Visit: Payer: Self-pay | Admitting: Family Medicine

## 2016-02-11 ENCOUNTER — Other Ambulatory Visit: Payer: Self-pay

## 2016-02-11 DIAGNOSIS — D649 Anemia, unspecified: Secondary | ICD-10-CM

## 2016-02-13 LAB — FERRITIN: Ferritin: 5 ng/mL — ABNORMAL LOW (ref 20–288)

## 2016-02-15 ENCOUNTER — Other Ambulatory Visit: Payer: Self-pay | Admitting: Family Medicine

## 2016-02-15 DIAGNOSIS — D509 Iron deficiency anemia, unspecified: Secondary | ICD-10-CM | POA: Insufficient documentation

## 2016-02-15 DIAGNOSIS — Z9884 Bariatric surgery status: Secondary | ICD-10-CM

## 2016-02-15 DIAGNOSIS — D508 Other iron deficiency anemias: Secondary | ICD-10-CM

## 2016-02-17 ENCOUNTER — Telehealth: Payer: Self-pay | Admitting: Family Medicine

## 2016-02-17 NOTE — Telephone Encounter (Signed)
Patient notified you always have a consultation before a colonoscopy to go over medications and to get ready for the procedure. Patient was just concerned about traveling and if she should start Iron tablets otc.

## 2016-02-17 NOTE — Telephone Encounter (Signed)
Pt was under impression that she was going to get a Colonoscopy this week but the office called her and told her it was for a consultation only. Pt is worried about her hemoglobin and would like a call back as soon as you can.

## 2016-02-21 NOTE — Telephone Encounter (Signed)
She can travel, she can start ferrous sulfate supplementation but needs to stop it one week prior to procedure.

## 2016-02-29 ENCOUNTER — Ambulatory Visit (INDEPENDENT_AMBULATORY_CARE_PROVIDER_SITE_OTHER): Payer: Commercial Managed Care - HMO | Admitting: Gastroenterology

## 2016-02-29 ENCOUNTER — Encounter: Payer: Self-pay | Admitting: Gastroenterology

## 2016-02-29 ENCOUNTER — Other Ambulatory Visit: Payer: Self-pay

## 2016-02-29 VITALS — BP 149/82 | HR 64 | Temp 98.8°F | Ht 65.0 in | Wt 170.0 lb

## 2016-02-29 DIAGNOSIS — R194 Change in bowel habit: Secondary | ICD-10-CM

## 2016-02-29 DIAGNOSIS — D508 Other iron deficiency anemias: Secondary | ICD-10-CM

## 2016-02-29 DIAGNOSIS — R140 Abdominal distension (gaseous): Secondary | ICD-10-CM

## 2016-02-29 MED ORDER — RIFAXIMIN 200 MG PO TABS: 550.0000 mg | ORAL_TABLET | Freq: Three times a day (TID) | ORAL | Status: DC

## 2016-02-29 MED ORDER — RIFAXIMIN 200 MG PO TABS: 550.0000 mg | ORAL_TABLET | Freq: Three times a day (TID) | ORAL | Status: AC

## 2016-02-29 NOTE — Progress Notes (Signed)
Gastroenterology Consultation  Referring Provider:     Steele Sizer, MD Primary Care Physician:  Loistine Chance, MD Primary Gastroenterologist:  Dr. Jonathon Bellows  Reason for Consultation:     Anemia        HPI:   Alexandria Newman is a 65 y.o. y/o female referred for consultation & management  by Dr. Loistine Chance, MD.    She has been referred for endoscopy with indications of anemia. 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 Subsequently had a bowel obstruction in 2015 with partial resection .    Labs 02/09/16 Hb 9.9 with MCV of 84,Cr 0.69.LFT-normal ,ferritin 5 , B12 ,borderlin b12 . No folate,MMA or homocysteine levels available.Hb 11.4 1 year back.   Anemia :   She just found out she is anemic, eats meat , denies any overt blood loss from nose, vomitus, urine , stool, also denies any vaginal bleeding. She does have fatigue. She is oral iron .   She says she has not known to have a low b12 in the past .   Last colonoscopy 10 years back , no polyps. No family history of colon polyps or cancer.  She is on a baby asprin. Denies any weight loss.  Since 2015 noticed a change in bowel habits, she has a bowel movement 5-7 times a day , right after meals, can occur in between as well.She also has a lot bloating , gas. Passes foul smelling gas. She does mention that the intensity of gas correlates to the frequency of bowel movements. Denies use of artificial sugars, no crystallite, no chewing gum. Consumes 2 cans of diet soda in the week. Sometimes worse with dairy products. Cant recal last EGD.    Past Medical History:  Diagnosis Date  . Anxiety   . B12 deficiency   . Chronic insomnia   . Degeneration of cervical intervertebral disc   . Depression   . Dysfunction of both eustachian tubes   . Hypercholesteremia   . Hypertension   . Metabolic syndrome   . Obesity   . Osteoarthritis of right hand   . Ovarian failure   . TMJ (dislocation of  temporomandibular joint)   . Vitamin D deficiency     Past Surgical History:  Procedure Laterality Date  . ABDOMINAL HYSTERECTOMY    . APPENDECTOMY    . GASTRIC BYPASS  2009  . LAPAROSCOPY N/A 01/05/2014   Procedure: LAPAROSCOPY DIAGNOSTIC CONVERTED TO  LAPAROTOMY ILECTOMY;  Surgeon: Erroll Luna, MD;  Location: [facility];  Service: General;  Laterality: N/A;    Prior to Admission medications   Medication Sig Start Date End Date Taking? Authorizing Provider  acetaminophen (TYLENOL) 325 MG tablet Take 2 tablets (650 mg total) by mouth every 6 (six) hours as needed for mild pain (or Temp > 100). 11/07/13  Yes Earnstine Regal, PA-C  ALPRAZolam Duanne Moron) 0.5 MG tablet Take 0.5-1 tablets (0.25-0.5 mg total) by mouth daily as needed for anxiety (Must last patient for 3 months). 02/09/16  Yes Steele Sizer, MD  aspirin (ASPIRIN CHILDRENS) 81 MG chewable tablet Chew 1 tablet (81 mg total) by mouth daily. 02/09/16  Yes Steele Sizer, MD  atorvastatin (LIPITOR) 40 MG tablet Take 1 tablet (40 mg total) by mouth every evening. 02/09/16  Yes Steele Sizer, MD  buPROPion (WELLBUTRIN XL) 150 MG 24 hr tablet TAKE 1 TABLET ONE TIME A DAY IN THE MORNING 09/17/15  Yes Steele Sizer, MD  Cholecalciferol (  VITAMIN D) 2000 units CAPS Take 1 capsule (2,000 Units total) by mouth daily. 02/09/16  Yes Steele Sizer, MD  citalopram (CELEXA) 20 MG tablet Take 1 tablet (20 mg total) by mouth daily. 02/09/16  Yes Steele Sizer, MD  lisinopril (PRINIVIL,ZESTRIL) 20 MG tablet TAKE 1 TABLET EVERY DAY 01/19/16  Yes Steele Sizer, MD  metoprolol succinate (TOPROL XL) 50 MG 24 hr tablet Take 1 tablet (50 mg total) by mouth daily. Take with or immediately following a meal. 12/31/15  Yes Steele Sizer, MD  temazepam (RESTORIL) 15 MG capsule Take 1 capsule (15 mg total) by mouth at bedtime as needed for sleep. 02/09/16  Yes Steele Sizer, MD  vitamin B-12 (CYANOCOBALAMIN) 1000 MCG tablet Take 1 tablet (1,000 mcg total) by mouth  daily. 08/10/15  Yes Steele Sizer, MD    Family History  Problem Relation Age of Onset  . Heart disease Mother   . CAD Mother   . Kidney disease Mother   . Heart attack Father   . Obesity Sister   . Heart attack Brother     after surgery     Social History  Substance Use Topics  . Smoking status: Never Smoker  . Smokeless tobacco: Never Used  . Alcohol use 0.0 oz/week     Comment: occasionally drink    Allergies as of 02/29/2016  . (No Known Allergies)    Review of Systems:    All systems reviewed and negative except where noted in HPI.   Physical Exam:  BP (!) 149/82 (BP Location: Left Arm, Patient Position: Sitting)   Pulse 64   Temp 98.8 F (37.1 C) (Oral)   Ht 5\' 5"  (1.651 m)   Wt 170 lb (77.1 kg)   BMI 28.29 kg/m  No LMP recorded. Patient has had a hysterectomy. Psych:  Alert and cooperative. Normal mood and affect. General:   Alert,  Well-developed, well-nourished, pleasant and cooperative in NAD Head:  Normocephalic and atraumatic. Eyes:  Sclera clear, no icterus.   Conjunctiva pink. Ears:  Normal auditory acuity. Nose:  No deformity, discharge, or lesions. Mouth:  No deformity or lesions,oropharynx pink & moist. Neck:  Supple; no masses or thyromegaly. Lungs:  Respirations even and unlabored.  Clear throughout to auscultation.   No wheezes, crackles, or rhonchi. No acute distress. Heart:  Regular rate and rhythm; no murmurs, clicks, rubs, or gallops. Abdomen:  Normal bowel sounds.  No bruits.  Soft, non-tender and non-distended without masses, hepatosplenomegaly or hernias noted.  No guarding or rebound tenderness.    Msk:  Symmetrical without gross deformities. Good, equal movement & strength bilaterally. Pulses:  Normal pulses noted. Extremities:  No clubbing or edema.  No cyanosis. Neurologic:  Alert and oriented x3;  grossly normal neurologically. Skin:  Intact without significant lesions or rashes. No jaundice. Lymph Nodes:  No significant cervical  adenopathy. Psych:  Alert and cooperative. Normal mood and affect.  Imaging Studies: No results found.  Assessment and Plan:   Alexandria Newman is a 65 y.o. y/o female has been referred for anemia. I reviewed available labs and its suggestive of iron deficiency anemia. She also has a borderline B12 level .  1. Iron deficiency anemia :   Will check urine, celiac serology , TSH,EGD+colonoscopy . If negative will need a capsule study   2. 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 Would suggest parenteral route. Also suggest checking folate, MMA and homocysteine levels.  I  will also check parietal cell antibodies. Low B12 is also seen in small bowel bacterial overgrowth syndrome   3. Change in bowel habits:   She recalls only having a small segement of bowel resected in 2015 when her symptoms started. Her history is consistent with small bowel bacterial overgrowth syndrome. Trial of Xifaxan.Advised to avoid artifical sugars   Follow up in 8 week.   Dr Jonathon Bellows MD

## 2016-02-29 NOTE — Patient Instructions (Signed)
Upper Endoscopy & Colonoscopy 03/21/2016 Blood work needs to be completed by 04/15/2016 Please call us if you have any questions or concerns.

## 2016-03-01 DIAGNOSIS — D508 Other iron deficiency anemias: Secondary | ICD-10-CM | POA: Diagnosis not present

## 2016-03-01 DIAGNOSIS — R140 Abdominal distension (gaseous): Secondary | ICD-10-CM | POA: Diagnosis not present

## 2016-03-03 ENCOUNTER — Telehealth: Payer: Self-pay | Admitting: *Deleted

## 2016-03-03 ENCOUNTER — Other Ambulatory Visit: Payer: Self-pay

## 2016-03-03 MED ORDER — RIFAXIMIN 200 MG PO TABS: 600.0000 mg | ORAL_TABLET | Freq: Three times a day (TID) | ORAL | 0 refills | Status: AC

## 2016-03-03 NOTE — Telephone Encounter (Signed)
Patient called and states that she seen Dr. Vicente Males and he was suppose to prescribe her a antibiotic and send it to her mail order company. Patient states she has not received it. She would like to get the RX sent to [facility] on [address] in [place] instead. Please advise. Thank you

## 2016-03-03 NOTE — Telephone Encounter (Signed)
Pt advised rx was resent to pharmacy.

## 2016-03-04 ENCOUNTER — Telehealth: Payer: Self-pay | Admitting: Gastroenterology

## 2016-03-04 LAB — TSH: TSH: 0.938 u[IU]/mL (ref 0.450–4.500)

## 2016-03-04 LAB — METHYLMALONIC ACID(MMA), RND URINE
CREATININE(CRT), U: 0.61 g/L (ref 0.30–3.00)
MMA - NORMALIZED: 1.7 umol/mmol{creat} (ref 0.4–2.5)
Methylmalonic Acid, Ur: 9 umol/L (ref 1.6–29.7)

## 2016-03-04 LAB — CELIAC DISEASE PANEL
Endomysial IgA: NEGATIVE
IGA/IMMUNOGLOBULIN A, SERUM: 179 mg/dL (ref 87–352)
Transglutaminase IgA: <2 U/mL (ref 0–3)

## 2016-03-04 LAB — URINALYSIS, ROUTINE W REFLEX MICROSCOPIC
Bilirubin, UA: NEGATIVE
GLUCOSE, UA: NEGATIVE
Ketones, UA: NEGATIVE
LEUKOCYTES UA: NEGATIVE
NITRITE UA: NEGATIVE
Protein, UA: NEGATIVE
RBC, UA: NEGATIVE
Specific Gravity, UA: 1.013 (ref 1.005–1.030)
Urobilinogen, Ur: 1 mg/dL (ref 0.2–1.0)
pH, UA: 6 (ref 5.0–7.5)

## 2016-03-04 LAB — FOLATE: Folate: 9.5 ng/mL (ref >3.0)

## 2016-03-04 LAB — INTRINSIC FACTOR ANTIBODIES: Intrinsic Factor Abs, Serum: 1 AU/mL (ref 0.0–1.1)

## 2016-03-04 LAB — ANTI-PARIETAL ANTIBODY: PARIETAL CELL AB: 3.2 U (ref 0.0–20.0)

## 2016-03-04 NOTE — Telephone Encounter (Signed)
Patient states the she is requesting a refill on Xifaxan. She states that an authorization is required and she will run out this weekend. Please contact patient at the number provided in the chart.

## 2016-03-04 NOTE — Telephone Encounter (Signed)
Authorization done through [email or web address]. Waiting for response.

## 2016-03-07 ENCOUNTER — Telehealth: Payer: Self-pay

## 2016-03-07 NOTE — Telephone Encounter (Signed)
Dr. Vicente Males,  Pt's Xifaxan was denied by her insurance. Is there anything else we can prescribe her?

## 2016-03-07 NOTE — Telephone Encounter (Signed)
Pt not allergic to either medication. Please advise MG and how to take.

## 2016-03-07 NOTE — Telephone Encounter (Signed)
Yes we can try alternative, we can try flagyl with Bactrim. Can we check if she is not allergic to both

## 2016-03-07 NOTE — Telephone Encounter (Signed)
Patient called in to check on status of medication. I explained to her that this has been sent to insurance on Friday 03/04/16 and we are currently awaiting a response from them at this time.  She asked if Dr. Vicente Males can just switch the medication to something that would be covered.

## 2016-03-08 ENCOUNTER — Other Ambulatory Visit: Payer: Self-pay

## 2016-03-08 MED ORDER — METRONIDAZOLE 500 MG PO TABS: 500.0000 mg | ORAL_TABLET | Freq: Three times a day (TID) | ORAL | 0 refills | Status: AC

## 2016-03-08 MED ORDER — SULFAMETHOXAZOLE-TRIMETHOPRIM 800-160 MG PO TABS: 1.0000 | ORAL_TABLET | Freq: Two times a day (BID) | ORAL | 0 refills | Status: AC

## 2016-03-08 NOTE — Telephone Encounter (Signed)
New antibiotic sent to [facility] per pt request. Pt has been notified of this medication change.

## 2016-03-08 NOTE — Telephone Encounter (Signed)
Metronidazole 500 mg three times per day; plus Trimethoprim-sulfamethoxazole 1 double-strength tablet twice per day for 10 days

## 2016-03-14 ENCOUNTER — Telehealth: Payer: Self-pay | Admitting: Gastroenterology

## 2016-03-14 NOTE — Telephone Encounter (Signed)
Patient stated Dr Vicente Males put her on an antibiotic for a week now and she hasn't felt well since. She feels her face in a little numb and sick to her stomach. Please call

## 2016-03-14 NOTE — Telephone Encounter (Signed)
Spoke with pt regarding her symptoms. She stated that she is no longer having the numbness in her face but she is experiencing lightheadedness, weakness and shaking. Spoke with Dr. Allen Norris and he has advised her to stop both antibiotics and to forward you this information to advise. Do you want to see her in clinic on Wednesday or wait?

## 2016-03-16 ENCOUNTER — Encounter: Payer: Self-pay | Admitting: *Deleted

## 2016-03-16 NOTE — Telephone Encounter (Signed)
I can see her back at the office

## 2016-03-21 ENCOUNTER — Ambulatory Visit: Payer: Commercial Managed Care - HMO | Admitting: Anesthesiology

## 2016-03-21 ENCOUNTER — Encounter: Payer: Self-pay | Admitting: *Deleted

## 2016-03-21 ENCOUNTER — Ambulatory Visit
Admission: RE | Admit: 2016-03-21 | Discharge: 2016-03-21 | Disposition: A | Discharge status: Home / Self Care | Payer: Commercial Managed Care - HMO | Source: Ambulatory Visit | Attending: Gastroenterology | Admitting: Gastroenterology

## 2016-03-21 ENCOUNTER — Encounter
Admission: RE | Disposition: A | Discharge status: Home / Self Care | Payer: Self-pay | Source: Ambulatory Visit | Attending: Gastroenterology

## 2016-03-21 DIAGNOSIS — D649 Anemia, unspecified: Secondary | ICD-10-CM | POA: Diagnosis not present

## 2016-03-21 DIAGNOSIS — F418 Other specified anxiety disorders: Secondary | ICD-10-CM | POA: Diagnosis not present

## 2016-03-21 DIAGNOSIS — M1990 Unspecified osteoarthritis, unspecified site: Secondary | ICD-10-CM | POA: Insufficient documentation

## 2016-03-21 DIAGNOSIS — D509 Iron deficiency anemia, unspecified: Secondary | ICD-10-CM | POA: Diagnosis not present

## 2016-03-21 DIAGNOSIS — Z9884 Bariatric surgery status: Secondary | ICD-10-CM | POA: Diagnosis not present

## 2016-03-21 DIAGNOSIS — I10 Essential (primary) hypertension: Secondary | ICD-10-CM | POA: Diagnosis not present

## 2016-03-21 DIAGNOSIS — E785 Hyperlipidemia, unspecified: Secondary | ICD-10-CM | POA: Diagnosis not present

## 2016-03-21 HISTORY — PX: ESOPHAGOGASTRODUODENOSCOPY (EGD) WITH PROPOFOL: SHX5813

## 2016-03-21 HISTORY — DX: Anemia, unspecified: D64.9

## 2016-03-21 HISTORY — PX: COLONOSCOPY WITH PROPOFOL: SHX5780

## 2016-03-21 SURGERY — COLONOSCOPY WITH PROPOFOL
Anesthesia: General

## 2016-03-21 MED ORDER — METOPROLOL SUCCINATE ER 50 MG PO TB24
50.0000 mg | ORAL_TABLET | Freq: Once | ORAL | Status: AC
  Administered 2016-03-21: 50 mg via ORAL

## 2016-03-21 MED ORDER — PROPOFOL 500 MG/50ML IV EMUL
INTRAVENOUS | Status: DC | PRN
  Administered 2016-03-21: 150 ug/kg/min via INTRAVENOUS

## 2016-03-21 MED ORDER — LIDOCAINE HCL (CARDIAC) 20 MG/ML IV SOLN
INTRAVENOUS | Status: DC | PRN
  Administered 2016-03-21: 60 mg via INTRAVENOUS

## 2016-03-21 MED ORDER — MIDAZOLAM HCL 2 MG/2ML IJ SOLN
INTRAMUSCULAR | Status: DC | PRN
  Administered 2016-03-21: 2 mg via INTRAVENOUS

## 2016-03-21 MED ORDER — METOPROLOL SUCCINATE ER 50 MG PO TB24
ORAL_TABLET | ORAL | Status: AC
  Administered 2016-03-21: 50 mg via ORAL
  Filled 2016-03-21: qty 1

## 2016-03-21 MED ORDER — GLYCOPYRROLATE 0.2 MG/ML IJ SOLN
INTRAMUSCULAR | Status: DC | PRN
  Administered 2016-03-21: 0.2 mg via INTRAVENOUS

## 2016-03-21 MED ORDER — SODIUM CHLORIDE 0.9 % IV SOLN
INTRAVENOUS | Status: DC
  Administered 2016-03-21 (×2): via INTRAVENOUS

## 2016-03-21 MED ORDER — PROPOFOL 10 MG/ML IV BOLUS
INTRAVENOUS | Status: DC | PRN
  Administered 2016-03-21: 50 mg via INTRAVENOUS

## 2016-03-21 NOTE — Telephone Encounter (Signed)
Pt has a follow up appt with Dr. Vicente Males on 03/30/16.

## 2016-03-21 NOTE — Op Note (Signed)
[facility] Gastroenterology Patient Name: Alexandria Newman Procedure Date: 03/21/2016 8:49 AM MRN: [id number] Account #: [id number] Date of Birth: 08-07-1950 Admit Type: Outpatient Age: 65 Room: [facility] ENDO ROOM 1 Gender: Female Note Status: Finalized Procedure:            Upper GI endoscopy Indications:          Iron deficiency anemia, Status post gastric bypass Providers:            Jonathon Bellows MD, MD Referring MD:         Bethena Roys. Sowles, MD (Referring MD) Medicines:            Monitored Anesthesia Care Complications:        No immediate complications. Procedure:            Pre-Anesthesia Assessment:                       - Prior to the procedure, a History and Physical was                        performed, and patient medications, allergies and                        sensitivities were reviewed. The patient's tolerance of                        previous anesthesia was reviewed.                       - The risks and benefits of the procedure and the                        sedation options and risks were discussed with the                        patient. All questions were answered and informed                        consent was obtained.                       - The risks and benefits of the procedure and the                        sedation options and risks were discussed with the                        patient. All questions were answered and informed                        consent was obtained.                       - ASA Grade Assessment: III - A patient with severe                        systemic disease.                       After obtaining informed consent, the endoscope was  passed under direct vision. Throughout the procedure,                        the patient's blood pressure, pulse, and oxygen                        saturations were monitored continuously. The Endoscope                        was introduced through the  mouth, and advanced to the                        jejunum. The upper GI endoscopy was accomplished with                        ease. The patient tolerated the procedure well. Findings:      The esophagus was normal.      The examined jejunum was normal. Biopsies for histology were taken with       a cold forceps for evaluation of celiac disease.      Evidence of a gastric bypass was found. A gastric pouch with a medium       size was found containing suture material. The staple line appeared       intact. The gastrojejunal anastomosis was characterized by healthy       appearing mucosa. This was traversed. The pouch-to-jejunum limb was       characterized by healthy appearing mucosa. The jejunojejunal anastomosis       was characterized by healthy appearing mucosa. The duodenum-to-jejunum       limb was not examined as it could not be found. Impression:           - Normal esophagus.                       - Normal examined jejunum. Biopsied.                       - Gastric bypass with a medium-sized pouch and intact                        staple line. Gastrojejunal anastomosis characterized by                        healthy appearing mucosa.                       - Normal examination. Recommendation:       - Await pathology results.                       - Patient has a contact number available for                        emergencies. The signs and symptoms of potential                        delayed complications were discussed with the patient.                        Return to normal activities tomorrow. Written discharge  instructions were provided to the patient.                       - Resume previous diet.                       - Discharge patient to home (with escort).                       - Await pathology results.                       - Return to my office as previously scheduled. Procedure Code(s):    --- Professional ---                       [phone],  Esophagogastroduodenoscopy, flexible, transoral;                        with biopsy, single or multiple Diagnosis Code(s):    --- Professional ---                       Z98.84, Bariatric surgery status                       D50.9, Iron deficiency anemia, unspecified CPT copyright 2016 American Medical Association. All rights reserved. The codes documented in this report are preliminary and upon coder review may  be revised to meet current compliance requirements. Jonathon Bellows, MD Jonathon Bellows MD, MD 03/21/2016 9:02:48 AM This report has been signed electronically. Number of Addenda: 0 Note Initiated On: 03/21/2016 8:49 AM      [facility]

## 2016-03-21 NOTE — Anesthesia Postprocedure Evaluation (Signed)
Anesthesia Post Note  Patient: Makeba Millman Wellman-Matthews  Procedure(s) Performed: Procedure(s) (LRB): COLONOSCOPY WITH PROPOFOL (N/A) ESOPHAGOGASTRODUODENOSCOPY (EGD) WITH PROPOFOL (N/A)  Patient location during evaluation: Endoscopy Anesthesia Type: General Level of consciousness: awake and alert Pain management: pain level controlled Vital Signs Assessment: post-procedure vital signs reviewed and stable Respiratory status: spontaneous breathing and respiratory function stable Cardiovascular status: stable Anesthetic complications: no    Last Vitals:  Vitals:   03/21/16 0945 03/21/16 0946  BP: (!) 152/77 (!) 149/83  Pulse:  66  Resp: 17 17  Temp:      Last Pain:  Vitals:   03/21/16 0945  TempSrc:   PainSc: 0-No pain                 Cortney Beissel K

## 2016-03-21 NOTE — Anesthesia Preprocedure Evaluation (Signed)
Anesthesia Evaluation  Patient identified by MRN, date of birth, ID band Patient awake    Reviewed: Allergy & Precautions, NPO status , Patient's Chart, lab work & pertinent test results  History of Anesthesia Complications Negative for: history of anesthetic complications  Airway Mallampati: II       Dental   Pulmonary neg pulmonary ROS,           Cardiovascular hypertension, Pt. on medications and Pt. on home beta blockers      Neuro/Psych negative neurological ROS     GI/Hepatic negative GI ROS, Neg liver ROS,   Endo/Other  negative endocrine ROS  Renal/GU negative Renal ROS     Musculoskeletal  (+) Arthritis , Osteoarthritis,    Abdominal   Peds  Hematology  (+) anemia ,   Anesthesia Other Findings   Reproductive/Obstetrics                             Anesthesia Physical Anesthesia Plan  ASA: III  Anesthesia Plan: General   Post-op Pain Management:    Induction: Intravenous  Airway Management Planned: Nasal Cannula  Additional Equipment:   Intra-op Plan:   Post-operative Plan:   Informed Consent: I have reviewed the patients History and Physical, chart, labs and discussed the procedure including the risks, benefits and alternatives for the proposed anesthesia with the patient or authorized representative who has indicated his/her understanding and acceptance.     Plan Discussed with:   Anesthesia Plan Comments:         Anesthesia Quick Evaluation

## 2016-03-21 NOTE — Op Note (Signed)
[facility] Gastroenterology Patient Name: Alexandria Newman Procedure Date: 03/21/2016 8:48 AM MRN: [id number] Account #: [id number] Date of Birth: [birth year] Admit Type: Outpatient Age: 65 Room: [facility] ENDO ROOM 1 Gender: Female Note Status: Finalized Procedure:            Colonoscopy Indications:          Iron deficiency anemia Providers:            Jonathon Bellows MD, MD Referring MD:         Bethena Roys. Sowles, MD (Referring MD) Medicines:            Monitored Anesthesia Care Complications:        No immediate complications. Procedure:            Pre-Anesthesia Assessment:                       - ASA Grade Assessment: III - A patient with severe                        systemic disease.                       - Prior to the procedure, a History and Physical was                        performed, and patient medications, allergies and                        sensitivities were reviewed. The patient's tolerance of                        previous anesthesia was reviewed.                       - The risks and benefits of the procedure and the                        sedation options and risks were discussed with the                        patient. All questions were answered and informed                        consent was obtained.                       - Patient identification and proposed procedure were                        verified prior to the procedure. The procedure was                        verified in the procedure room.                       After obtaining informed consent, the colonoscope was                        passed under direct vision. Throughout the procedure,  the patient's blood pressure, pulse, and oxygen                        saturations were monitored continuously. The                        Colonoscope was introduced through the anus and                        advanced to the the cecum, identified by the             appendiceal orifice, IC valve and transillumination.                        The colonoscopy was performed with ease. The patient                        tolerated the procedure well. The quality of the bowel                        preparation was excellent. Findings:      The entire examined colon appeared normal on direct and retroflexion       views. Impression:           - The entire examined colon is normal on direct and                        retroflexion views.                       - No specimens collected. Recommendation:       - Patient has a contact number available for                        emergencies. The signs and symptoms of potential                        delayed complications were discussed with the patient.                        Return to normal activities tomorrow. Written discharge                        instructions were provided to the patient.                       - Resume previous diet.                       - Continue present medications.                       - Discharge patient to home (with escort).                       - Repeat colonoscopy in 10 years for screening purposes.                       - Return to my office as previously scheduled. Procedure Code(s):    --- Professional ---  45378, Colonoscopy, flexible; diagnostic, including                        collection of specimen(s) by brushing or washing, when                        performed (separate procedure) Diagnosis Code(s):    --- Professional ---                       D50.9, Iron deficiency anemia, unspecified CPT copyright 2016 American Medical Association. All rights reserved. The codes documented in this report are preliminary and upon coder review may  be revised to meet current compliance requirements. Jonathon Bellows, MD Jonathon Bellows MD, MD 03/21/2016 9:23:20 AM This report has been signed electronically. Number of Addenda: 0 Note Initiated On: 03/21/2016  8:48 AM Scope Withdrawal Time: 0 hours 12 minutes 9 seconds  Total Procedure Duration: 0 hours 16 minutes 8 seconds       [facility]

## 2016-03-21 NOTE — H&P (Signed)
Jonathon Bellows MD [address], [place], [place] [address] Phone: [phone] Fax : [phone]  Primary Care Physician:  Loistine Chance, MD Primary Gastroenterologist:  Dr. Jonathon Bellows   Pre-Procedure History & Physical: HPI:  Alexandria Newman is a 65 y.o. female is here for an endoscopy and colonoscopy.   Past Medical History:  Diagnosis Date  . Anemia   . Anxiety   . B12 deficiency   . Chronic insomnia   . Degeneration of cervical intervertebral disc   . Depression   . Dysfunction of both eustachian tubes   . Hypercholesteremia   . Hypertension   . Metabolic syndrome   . Obesity   . Osteoarthritis of right hand   . Ovarian failure   . TMJ (dislocation of temporomandibular joint)   . Vitamin D deficiency     Past Surgical History:  Procedure Laterality Date  . ABDOMINAL HYSTERECTOMY    . APPENDECTOMY    . GASTRIC BYPASS  2009  . LAPAROSCOPY N/A 01/05/2014   Procedure: LAPAROSCOPY DIAGNOSTIC CONVERTED TO  LAPAROTOMY ILECTOMY;  Surgeon: Erroll Luna, MD;  Location: [place];  Service: General;  Laterality: N/A;    Prior to Admission medications   Medication Sig Start Date End Date Taking? Authorizing Provider  Cholecalciferol (VITAMIN D) 2000 units CAPS Take 1 capsule (2,000 Units total) by mouth daily. 02/09/16  Yes Steele Sizer, MD  vitamin B-12 (CYANOCOBALAMIN) 1000 MCG tablet Take 1 tablet (1,000 mcg total) by mouth daily. 08/10/15  Yes Steele Sizer, MD  acetaminophen (TYLENOL) 325 MG tablet Take 2 tablets (650 mg total) by mouth every 6 (six) hours as needed for mild pain (or Temp > 100). 11/07/13   Earnstine Regal, PA-C  ALPRAZolam Duanne Moron) 0.5 MG tablet Take 0.5-1 tablets (0.25-0.5 mg total) by mouth daily as needed for anxiety (Must last patient for 3 months). 02/09/16   Steele Sizer, MD  aspirin (ASPIRIN CHILDRENS) 81 MG chewable tablet Chew 1 tablet (81 mg total) by mouth daily. Patient not taking: Reported on 03/21/2016 02/09/16   Steele Sizer, MD  atorvastatin (LIPITOR) 40 MG tablet Take 1 tablet (40 mg total) by mouth every evening. 02/09/16   Steele Sizer, MD  buPROPion (WELLBUTRIN XL) 150 MG 24 hr tablet TAKE 1 TABLET ONE TIME A DAY IN THE MORNING 09/17/15   Steele Sizer, MD  citalopram (CELEXA) 20 MG tablet Take 1 tablet (20 mg total) by mouth daily. 02/09/16   Steele Sizer, MD  lisinopril (PRINIVIL,ZESTRIL) 20 MG tablet TAKE 1 TABLET EVERY DAY 01/19/16   Steele Sizer, MD  metoprolol succinate (TOPROL XL) 50 MG 24 hr tablet Take 1 tablet (50 mg total) by mouth daily. Take with or immediately following a meal. 12/31/15   Steele Sizer, MD  temazepam (RESTORIL) 15 MG capsule Take 1 capsule (15 mg total) by mouth at bedtime as needed for sleep. 02/09/16   Steele Sizer, MD    Allergies as of 02/29/2016  . (No Known Allergies)    Family History  Problem Relation Age of Onset  . Heart disease Mother   . CAD Mother   . Kidney disease Mother   . Heart attack Father   . Obesity Sister   . Heart attack Brother     after surgery    Social History   Social History  . Marital status: Married    Spouse name: N/A  . Number of children: N/A  . Years of education: N/A   Occupational History  . Not  on file.   Social History Main Topics  . Smoking status: Never Smoker  . Smokeless tobacco: Never Used  . Alcohol use 0.0 oz/week     Comment: occasionally drink  . Drug use: No  . Sexual activity: No   Other Topics Concern  . Not on file   Social History Narrative  . No narrative on file    Review of Systems: See HPI, otherwise negative ROS  Physical Exam: BP (!) 163/72   Pulse 69   Temp 97.5 F (36.4 C) (Tympanic)   Resp 16   Ht 5\' 5"  (1.651 m)   Wt 170 lb (77.1 kg)   SpO2 100%   BMI 28.29 kg/m  General:   Alert,  pleasant and cooperative in NAD Head:  Normocephalic and atraumatic. Neck:  Supple; no masses or thyromegaly. Lungs:  Clear throughout to auscultation.    Heart:  Regular rate and  rhythm. Abdomen:  Soft, nontender and nondistended. Normal bowel sounds, without guarding, and without rebound.   Neurologic:  Alert and  oriented x4;  grossly normal neurologically.  Impression/Plan: Alexandria Newman is here for an endoscopy and colonoscopy to be performed for iron deficiency anemia  Risks, benefits, limitations, and alternatives regarding  endoscopy and colonoscopy have been reviewed with the patient.  Questions have been answered.  All parties agreeable.   Jonathon Bellows, MD  03/21/2016, 8:40 AM

## 2016-03-21 NOTE — Transfer of Care (Signed)
Immediate Anesthesia Transfer of Care Note  Patient: Alexandria Newman  Procedure(s) Performed: Procedure(s): COLONOSCOPY WITH PROPOFOL (N/A) ESOPHAGOGASTRODUODENOSCOPY (EGD) WITH PROPOFOL (N/A)  Patient Location: Endoscopy Unit  Anesthesia Type:General  Level of Consciousness: awake, alert , oriented and patient cooperative  Airway & Oxygen Therapy: Patient Spontanous Breathing and Patient connected to nasal cannula oxygen  Post-op Assessment: Report given to RN, Post -op Vital signs reviewed and stable and Patient moving all extremities X 4  Post vital signs: Reviewed and stable  Last Vitals:  Vitals:   03/21/16 0800  BP: (!) 163/72  Pulse: 69  Resp: 16  Temp: 36.4 C    Last Pain:  Vitals:   03/21/16 0800  TempSrc: Tympanic         Complications: No apparent anesthesia complications

## 2016-03-22 ENCOUNTER — Encounter: Payer: Self-pay | Admitting: Gastroenterology

## 2016-03-22 LAB — SURGICAL PATHOLOGY

## 2016-03-24 ENCOUNTER — Other Ambulatory Visit: Payer: Self-pay | Admitting: Family Medicine

## 2016-03-24 DIAGNOSIS — I10 Essential (primary) hypertension: Secondary | ICD-10-CM

## 2016-03-25 ENCOUNTER — Telehealth: Payer: Self-pay

## 2016-03-25 NOTE — Telephone Encounter (Signed)
-----   Message from Jonathon Bellows, MD sent at 03/23/2016 10:07 AM EST ----- Normal jejunal biopsies

## 2016-03-25 NOTE — Telephone Encounter (Signed)
Pt notified of EGD results. Pt has follow up appt with Dr. Vicente Males on Dec 6th.

## 2016-03-30 ENCOUNTER — Other Ambulatory Visit
Admission: RE | Admit: 2016-03-30 | Discharge: 2016-03-30 | Disposition: A | Discharge status: Home / Self Care | Payer: Commercial Managed Care - HMO | Source: Ambulatory Visit | Attending: Gastroenterology | Admitting: Gastroenterology

## 2016-03-30 ENCOUNTER — Ambulatory Visit (INDEPENDENT_AMBULATORY_CARE_PROVIDER_SITE_OTHER): Payer: Commercial Managed Care - HMO | Admitting: Gastroenterology

## 2016-03-30 ENCOUNTER — Other Ambulatory Visit: Payer: Self-pay

## 2016-03-30 ENCOUNTER — Telehealth: Payer: Self-pay

## 2016-03-30 VITALS — BP 155/79 | HR 62 | Temp 98.6°F | Ht 65.0 in | Wt 170.0 lb

## 2016-03-30 DIAGNOSIS — D508 Other iron deficiency anemias: Secondary | ICD-10-CM | POA: Diagnosis not present

## 2016-03-30 LAB — CBC WITH DIFFERENTIAL/PLATELET
BASOS PCT: 2 %
Basophils Absolute: 0.1 10*3/uL (ref 0–0.1)
EOS ABS: 0.2 10*3/uL (ref 0–0.7)
EOS PCT: 2 %
HCT: 35.3 % (ref 35.0–47.0)
Hemoglobin: 11.6 g/dL — ABNORMAL LOW (ref 12.0–16.0)
LYMPHS ABS: 2.9 10*3/uL (ref 1.0–3.6)
Lymphocytes Relative: 38 %
MCH: 27.6 pg (ref 26.0–34.0)
MCHC: 32.9 g/dL (ref 32.0–36.0)
MCV: 84 fL (ref 80.0–100.0)
MONOS PCT: 7 %
Monocytes Absolute: 0.5 10*3/uL (ref 0.2–0.9)
Neutro Abs: 4 10*3/uL (ref 1.4–6.5)
Neutrophils Relative %: 51 %
PLATELETS: 264 10*3/uL (ref 150–440)
RBC: 4.2 MIL/uL (ref 3.80–5.20)
RDW: 17.4 % — ABNORMAL HIGH (ref 11.5–14.5)
WBC: 7.7 10*3/uL (ref 3.6–11.0)

## 2016-03-30 LAB — IRON AND TIBC
IRON: 42 ug/dL (ref 28–170)
Saturation Ratios: 10 % — ABNORMAL LOW (ref 10.4–31.8)
TIBC: 418 ug/dL (ref 250–450)
UIBC: 376 ug/dL

## 2016-03-30 LAB — FERRITIN: Ferritin: 11 ng/mL (ref 11–307)

## 2016-03-30 NOTE — Patient Instructions (Signed)
I will be contacting you to give you appointment times for your x- ray and capsule test along with your follow up.   Please call us if you have any questions or concerns

## 2016-03-30 NOTE — Telephone Encounter (Signed)
Called Patient to give her the scheduled appointments   1. Her Small Bowel Follow thru x-ray has been scheduled for 04/07/16 at 7:45 in the [facility]. She should have nothing to eat or drink after midnight the night prior to   2. Her [place] Capsule Study is scheduled for 04/11/2016. She needs to call [phone] between 1-3 the day before her procedure to find out the exact time to come in. I also need to either email her the Capsule Endoscopy Preparation sheet, or she needs to come in to pick that up.   3. Her office follow up for all of this will be on 03/23/2017 at 1:00 with Dr. Vicente Males  -- Please send me a message after patient has been notified so that I can e-mail her the document --

## 2016-03-30 NOTE — Progress Notes (Signed)
Primary Care Physician: Loistine Chance, MD  Primary Gastroenterologist:  Dr. Jonathon Bellows   No chief complaint on file.   HPI: Alexandria Newman is a 65 y.o. female here to follow up for iron deficiency, change in bowel habits anemia. She was last seen on 02/29/2016  Interval history 02/29/2016-03/30/2016  Ua, parietal cell ab,IF antibody ,TSH,celiac serology, MMA,Homocysteine -negative or normal  EGD,colonoscopy were normal .   Treated empirically for small bowel bacterial overgrowth with Bactrim, Flagyl for 10 days  Completed iron tablets a week back .gas , bloating is much better.   Current Outpatient Prescriptions  Medication Sig Dispense Refill  . acetaminophen (TYLENOL) 325 MG tablet Take 2 tablets (650 mg total) by mouth every 6 (six) hours as needed for mild pain (or Temp > 100).    . ALPRAZolam (XANAX) 0.5 MG tablet Take 0.5-1 tablets (0.25-0.5 mg total) by mouth daily as needed for anxiety (Must last patient for 3 months). 30 tablet 0  . aspirin (ASPIRIN CHILDRENS) 81 MG chewable tablet Chew 1 tablet (81 mg total) by mouth daily. (Patient not taking: Reported on 03/21/2016) 30 tablet 0  . atorvastatin (LIPITOR) 40 MG tablet Take 1 tablet (40 mg total) by mouth every evening. 90 tablet 1  . buPROPion (WELLBUTRIN XL) 150 MG 24 hr tablet TAKE 1 TABLET ONE TIME A DAY IN THE MORNING 90 tablet 3  . Cholecalciferol (VITAMIN D) 2000 units CAPS Take 1 capsule (2,000 Units total) by mouth daily. 30 capsule 0  . citalopram (CELEXA) 20 MG tablet Take 1 tablet (20 mg total) by mouth daily. 90 tablet 1  . lisinopril (PRINIVIL,ZESTRIL) 20 MG tablet TAKE 1 TABLET EVERY DAY 90 tablet 0  . metoprolol succinate (TOPROL XL) 50 MG 24 hr tablet Take 1 tablet (50 mg total) by mouth daily. Take with or immediately following a meal. 90 tablet 1  . temazepam (RESTORIL) 15 MG capsule Take 1 capsule (15 mg total) by mouth at bedtime as needed for sleep. 90 capsule 1  . vitamin B-12  (CYANOCOBALAMIN) 1000 MCG tablet Take 1 tablet (1,000 mcg total) by mouth daily. 90 tablet 1   No current facility-administered medications for this visit.     Allergies as of 03/30/2016  . (No Known Allergies)    ROS:  General: Negative for anorexia, weight loss, fever, chills, fatigue, weakness. ENT: Negative for hoarseness, difficulty swallowing , nasal congestion. CV: Negative for chest pain, angina, palpitations, dyspnea on exertion, peripheral edema.  Respiratory: Negative for dyspnea at rest, dyspnea on exertion, cough, sputum, wheezing.  GI: See history of present illness. GU:  Negative for dysuria, hematuria, urinary incontinence, urinary frequency, nocturnal urination.  Endo: Negative for unusual weight change.    Physical Examination:   There were no vitals taken for this visit.  General: Well-nourished, well-developed in no acute distress.  Eyes: No icterus. Conjunctivae pink. Mouth: Oropharyngeal mucosa moist and pink , no lesions erythema or exudate. Lungs: Clear to auscultation bilaterally. Non-labored. Heart: Regular rate and rhythm, no murmurs rubs or gallops.  Abdomen: Bowel sounds are normal, nontender, nondistended, no hepatosplenomegaly or masses, no abdominal bruits or hernia , no rebound or guarding.   Extremities: No lower extremity edema. No clubbing or deformities. Neuro: Alert and oriented x 3.  Grossly intact. Skin: Warm and dry, no jaundice.   Psych: Alert and cooperative, normal mood and affect.  Labs:  CBC Latest Ref Rng & Units 02/09/2016 02/04/2015 01/05/2014  WBC 3.8 - 10.8 K/uL 7.6 7.0  7.9  Hemoglobin 11.7 - 15.5 g/dL 9.9(L) - 10.4(L)  Hematocrit 35.0 - 45.0 % 32.2(L) 34.0 32.6(L)  Platelets 140 - 400 K/uL 293 280 212    Imaging Studies: No results found.  Assessment and Plan:   Alexandria Newman is a 65 y.o. y/o female being followed for iron deficiency anemia.    1. Iron deficiency anemia :   Normal EGD+colonoscopy - will  need capsule study(after a small bowel follow through to r/o obstruction ) to complete evaluation - likely related to gastric bypass that she is not absorbing oral iron  .Suggest recheck CBCD and if still anemic then will need IV iron .   2. 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 Would suggest parenteral route.  3. Change in bowel habits:   Took a few days of antibiotics  with bactrim and flagyl for a few days, did not tolerate the medications. I treated her empirically for small bowel bacterial overgrowth she seems much better and for now does not need any further treatment   Dr Jonathon Bellows  MD Follow up in 6 weeks

## 2016-04-06 ENCOUNTER — Telehealth: Payer: Self-pay

## 2016-04-06 NOTE — Telephone Encounter (Signed)
Wants to cancel her Exam for tomorrow. She said her lab work cam back and her iron is high. Please contact patient and advice.

## 2016-04-07 ENCOUNTER — Ambulatory Visit: Payer: Medicare Other

## 2016-04-11 ENCOUNTER — Encounter: Admission: RE | Payer: Self-pay | Source: Ambulatory Visit

## 2016-04-11 ENCOUNTER — Ambulatory Visit
Admission: RE | Admit: 2016-04-11 | Payer: Commercial Managed Care - HMO | Source: Ambulatory Visit | Admitting: Gastroenterology

## 2016-04-11 SURGERY — IMAGING PROCEDURE, GI TRACT, INTRALUMINAL, VIA CAPSULE
Anesthesia: General

## 2016-05-06 ENCOUNTER — Telehealth: Payer: Self-pay

## 2016-05-06 NOTE — Telephone Encounter (Signed)
-----   Message from Jonathon Bellows, MD sent at 04/28/2016 12:27 PM EST ----- Cbc improved- suggest to continue oral iron

## 2016-05-06 NOTE — Telephone Encounter (Signed)
Pt notified of lab results

## 2016-05-23 ENCOUNTER — Telehealth: Payer: Self-pay | Admitting: Gastroenterology

## 2016-05-23 ENCOUNTER — Other Ambulatory Visit: Payer: Self-pay

## 2016-05-23 ENCOUNTER — Ambulatory Visit: Payer: Commercial Managed Care - HMO | Admitting: Gastroenterology

## 2016-05-23 ENCOUNTER — Other Ambulatory Visit: Payer: Self-pay | Admitting: Family Medicine

## 2016-05-23 MED ORDER — AMOXICILLIN-POT CLAVULANATE 875-125 MG PO TABS: 1.0000 | ORAL_TABLET | Freq: Two times a day (BID) | ORAL | 0 refills | Status: DC

## 2016-05-23 NOTE — Telephone Encounter (Signed)
Can try augmentin 875 mg BID for 10 days. Check for penicillin allergy.  Dr Jonathon Bellows  Gastroenterology/Hepatology Pager: [phone]

## 2016-05-23 NOTE — Telephone Encounter (Signed)
Pt notified Augmentin 875mg  will be sent to pharmacy. No PCN allergy noted per pt.

## 2016-05-23 NOTE — Telephone Encounter (Signed)
Patient canceled her appointment today due to work and would like for you to just call her in an antibiotic that Dr. Vicente Males talked about. The one she had to start with was to strong. She didn't know the name of it.

## 2016-05-23 NOTE — Progress Notes (Unsigned)
Pt notified rx for Augmentin called into her pharmacy. No allergy to PCN noted per pt.

## 2016-05-23 NOTE — Telephone Encounter (Signed)
Please advise below. Pt was being treated for bacterial overgrowth but was not able to tolerate the antibiotics you gave her so she had to stop them after two weeks. This was noted on your office note. She was doing okay during that time but now feels the bacteria has returned because she is having diarrhea with foul odor. She would like to start another antibiotic for this.

## 2016-05-27 ENCOUNTER — Other Ambulatory Visit: Payer: Self-pay | Admitting: Family Medicine

## 2016-05-27 DIAGNOSIS — I10 Essential (primary) hypertension: Secondary | ICD-10-CM

## 2016-06-29 DIAGNOSIS — H2513 Age-related nuclear cataract, bilateral: Secondary | ICD-10-CM | POA: Diagnosis not present

## 2016-06-29 DIAGNOSIS — H40033 Anatomical narrow angle, bilateral: Secondary | ICD-10-CM | POA: Diagnosis not present

## 2016-07-05 ENCOUNTER — Ambulatory Visit: Payer: Medicare HMO | Admitting: Family Medicine

## 2016-07-05 ENCOUNTER — Other Ambulatory Visit: Payer: Self-pay | Admitting: Family Medicine

## 2016-07-05 ENCOUNTER — Telehealth: Payer: Self-pay | Admitting: Family Medicine

## 2016-07-05 MED ORDER — ATENOLOL 25 MG PO TABS: 25.0000 mg | ORAL_TABLET | Freq: Every day | ORAL | 1 refills | Status: DC

## 2016-07-05 NOTE — Telephone Encounter (Signed)
Pt is requesting to go back on Atentolol. She was on this before and had to go off due to pharmacy not having it but now they have it again. Please send script to [place] [phone]

## 2016-07-08 DIAGNOSIS — L821 Other seborrheic keratosis: Secondary | ICD-10-CM | POA: Diagnosis not present

## 2016-07-08 DIAGNOSIS — H029 Unspecified disorder of eyelid: Secondary | ICD-10-CM | POA: Diagnosis not present

## 2016-07-10 DIAGNOSIS — J209 Acute bronchitis, unspecified: Secondary | ICD-10-CM | POA: Diagnosis not present

## 2016-07-12 ENCOUNTER — Telehealth: Payer: Self-pay | Admitting: Gastroenterology

## 2016-07-12 NOTE — Telephone Encounter (Signed)
Pt would like to reschedule [place] Capsule Study.   Advised callback in am due to [facility] scheduler having left for the day.

## 2016-07-12 NOTE — Telephone Encounter (Signed)
Patient ready to schedule her procedure.

## 2016-07-13 ENCOUNTER — Other Ambulatory Visit: Payer: Self-pay

## 2016-07-13 ENCOUNTER — Telehealth: Payer: Self-pay

## 2016-07-13 DIAGNOSIS — D509 Iron deficiency anemia, unspecified: Secondary | ICD-10-CM

## 2016-07-13 NOTE — Telephone Encounter (Signed)
LVM for patient concerning Capsule Study.   Mailed preparation information.

## 2016-07-18 ENCOUNTER — Telehealth: Payer: Self-pay | Admitting: Gastroenterology

## 2016-07-18 NOTE — Telephone Encounter (Signed)
Patient called and is having a capsule endo on Monday 07/25/16 and feels like it might be her gallbladder. Will this test look at her gallbladder?  Please call patient.

## 2016-07-19 NOTE — Telephone Encounter (Signed)
At her last visit we only were aware of iron deficiency anemia which has no relation to her gall bladder .   IF she is having abdominal pain then she needs to see me to discuss it in detail. Her capsule study is being done to evaluate the iron deficiency anemia and not the abdominal pain

## 2016-07-19 NOTE — Telephone Encounter (Signed)
LVM for patient concerning capsule study. Advised callback.

## 2016-07-20 ENCOUNTER — Ambulatory Visit (INDEPENDENT_AMBULATORY_CARE_PROVIDER_SITE_OTHER): Payer: Medicare HMO | Admitting: Family Medicine

## 2016-07-20 ENCOUNTER — Ambulatory Visit
Admission: RE | Admit: 2016-07-20 | Discharge: 2016-07-20 | Disposition: A | Discharge status: Home / Self Care | Payer: Medicare HMO | Source: Ambulatory Visit | Attending: Family Medicine | Admitting: Family Medicine

## 2016-07-20 ENCOUNTER — Encounter: Payer: Self-pay | Admitting: Family Medicine

## 2016-07-20 VITALS — BP 132/74 | HR 86 | Temp 99.4°F | Resp 16 | Wt 167.4 lb

## 2016-07-20 DIAGNOSIS — R0602 Shortness of breath: Secondary | ICD-10-CM | POA: Diagnosis not present

## 2016-07-20 DIAGNOSIS — R05 Cough: Secondary | ICD-10-CM

## 2016-07-20 DIAGNOSIS — R52 Pain, unspecified: Secondary | ICD-10-CM | POA: Diagnosis not present

## 2016-07-20 DIAGNOSIS — F320 Major depressive disorder, single episode, mild: Secondary | ICD-10-CM | POA: Diagnosis not present

## 2016-07-20 DIAGNOSIS — R059 Cough, unspecified: Secondary | ICD-10-CM

## 2016-07-20 LAB — CBC WITH DIFFERENTIAL/PLATELET
BASOS ABS: 0 {cells}/uL (ref 0–200)
Basophils Relative: 0 %
EOS ABS: 0 {cells}/uL — AB (ref 15–500)
Eosinophils Relative: 0 %
HEMATOCRIT: 33.1 % — AB (ref 35.0–45.0)
HEMOGLOBIN: 10.8 g/dL — AB (ref 11.7–15.5)
LYMPHS ABS: 3864 {cells}/uL (ref 850–3900)
Lymphocytes Relative: 42 %
MCH: 28 pg (ref 27.0–33.0)
MCHC: 32.6 g/dL (ref 32.0–36.0)
MCV: 85.8 fL (ref 80.0–100.0)
MONO ABS: 1012 {cells}/uL — AB (ref 200–950)
MONOS PCT: 11 %
MPV: 9.5 fL (ref 7.5–12.5)
NEUTROS ABS: 4324 {cells}/uL (ref 1500–7800)
Neutrophils Relative %: 47 %
Platelets: 227 10*3/uL (ref 140–400)
RBC: 3.86 MIL/uL (ref 3.80–5.10)
RDW: 14.5 % (ref 11.0–15.0)
WBC: 9.2 10*3/uL (ref 3.8–10.8)

## 2016-07-20 MED ORDER — LORATADINE 10 MG PO TABS: 10.0000 mg | ORAL_TABLET | Freq: Every day | ORAL | 0 refills | Status: DC

## 2016-07-20 MED ORDER — FLUTICASONE FUROATE-VILANTEROL 100-25 MCG/INH IN AEPB: 1.0000 | INHALATION_SPRAY | Freq: Every day | RESPIRATORY_TRACT | 0 refills | Status: DC

## 2016-07-20 MED ORDER — GUAIFENESIN ER 600 MG PO TB12: 1200.0000 mg | ORAL_TABLET | Freq: Two times a day (BID) | ORAL | 0 refills | Status: DC

## 2016-07-20 NOTE — Progress Notes (Signed)
Name: [facility]   MRN: [id number]    DOB: [birth year]   Date:07/20/2016       Progress Note  Subjective  Chief Complaint  Chief Complaint  Patient presents with  . Medication Problem    to discuss medication. Wanted to look at meds that she can stop taking  . Cough    pt had bronchitis went to [facility] clinic and finished meds given, pt stated that she feels even worse and has been running low grade fevers along with aches and head congeston                 HPI  Cough: she has been coughing for the past month. She states symptoms started with mild sore throat, dry cough, followed by headache, fatigue, nasal congestion and rhinorrhea. She finally went to Urgent care last week and was given Augmentin, prednisone, benzonate and a cough syrup. She states she has not noticed much of an improvement and finished all medications a couple of days ago. She is tired of feeling sick. She has noticed mild SOB, but only has chest pain with coughing spells. No orthopnea. She is not a smokers.   Depression Major: she states she is feeling well and would like to wean off anti-depressants. Suggested going down ton Celexa, but she prefers stopping Wellbutrin XL. She will try being off medication until her follow up in 3 weeks and she how she feels.   Patient Active Problem List   Diagnosis Date Noted  . Iron deficiency anemia 02/15/2016  . Senile purpura ([place]) 08/10/2015  . Hyperlipidemia 02/04/2015  . History of small bowel obstruction 02/04/2015  . Hypertension, benign 02/04/2015  . DDD (degenerative disc disease), cervical 02/04/2015  . 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  . Insomnia 02/04/2015  . Osteoarthritis of finger of right hand 02/04/2015  . Metabolic syndrome 96/78/9381  . Depression, major, single episode, mild ([place]) 02/04/2015  . 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 Vitamin D deficiency 02/04/2015  . Vitamin B12 deficiency 02/04/2015  . Bariatric surgery  status     Past Surgical History:  Procedure Laterality Date  . ABDOMINAL HYSTERECTOMY    . APPENDECTOMY    . COLONOSCOPY WITH PROPOFOL N/A 03/21/2016   Procedure: COLONOSCOPY WITH PROPOFOL;  Surgeon: Jonathon Bellows, MD;  Location: [facility] ENDOSCOPY;  Service: Endoscopy;  Laterality: N/A;  . ESOPHAGOGASTRODUODENOSCOPY (EGD) WITH PROPOFOL N/A 03/21/2016   Procedure: ESOPHAGOGASTRODUODENOSCOPY (EGD) WITH PROPOFOL;  Surgeon: Jonathon Bellows, MD;  Location: [facility] ENDOSCOPY;  Service: Endoscopy;  Laterality: N/A;  . GASTRIC BYPASS  2009  . LAPAROSCOPY N/A 01/05/2014   Procedure: LAPAROSCOPY DIAGNOSTIC CONVERTED TO  LAPAROTOMY ILECTOMY;  Surgeon: Erroll Luna, MD;  Location: [place] OR;  Service: General;  Laterality: N/A;    Family History  Problem Relation Age of Onset  . Heart disease Mother   . CAD Mother   . Kidney disease Mother   . Heart attack Father   . Obesity Sister   . Heart attack Brother     after surgery    Social History   Social History  . Marital status: Married    Spouse name: N/A  . Number of children: N/A  . Years of education: N/A   Occupational History  . Not on file.   Social History Main Topics  . Smoking status: Never Smoker  . Smokeless tobacco: Never Used  . Alcohol use 0.0 oz/week     Comment: occasionally drink  . Drug use: No  . Sexual activity:  No   Other Topics Concern  . Not on file   Social History Narrative  . No narrative on file     Current Outpatient Prescriptions:  .  acetaminophen (TYLENOL) 325 MG tablet, Take 2 tablets (650 mg total) by mouth every 6 (six) hours as needed for mild pain (or Temp > 100)., Disp: , Rfl:  .  ALPRAZolam (XANAX) 0.5 MG tablet, Take 0.5-1 tablets (0.25-0.5 mg total) by mouth daily as needed for anxiety (Must last patient for 3 months)., Disp: 30 tablet, Rfl: 0 .  aspirin (ASPIRIN CHILDRENS) 81 MG chewable tablet, Chew 1 tablet (81 mg total) by mouth daily. (Patient not taking: Reported on 03/21/2016), Disp: 30  tablet, Rfl: 0 .  atenolol (TENORMIN) 25 MG tablet, Take 1 tablet (25 mg total) by mouth daily., Disp: 90 tablet, Rfl: 1 .  atorvastatin (LIPITOR) 40 MG tablet, Take 1 tablet (40 mg total) by mouth every evening., Disp: 90 tablet, Rfl: 1 .  Cholecalciferol (VITAMIN D) 2000 units CAPS, Take 1 capsule (2,000 Units total) by mouth daily., Disp: 30 capsule, Rfl: 0 .  citalopram (CELEXA) 20 MG tablet, Take 1 tablet (20 mg total) by mouth daily., Disp: 90 tablet, Rfl: 1 .  fluticasone furoate-vilanterol (BREO ELLIPTA) 100-25 MCG/INH AEPB, Inhale 1 puff into the lungs daily., Disp: 60 each, Rfl: 0 .  guaiFENesin (MUCINEX) 600 MG 12 hr tablet, Take 2 tablets (1,200 mg total) by mouth 2 (two) times daily., Disp: 30 tablet, Rfl: 0 .  lisinopril (PRINIVIL,ZESTRIL) 20 MG tablet, TAKE 1 TABLET EVERY DAY, Disp: 90 tablet, Rfl: 0 .  loratadine (CLARITIN) 10 MG tablet, Take 1 tablet (10 mg total) by mouth daily., Disp: 30 tablet, Rfl: 0 .  temazepam (RESTORIL) 15 MG capsule, Take 1 capsule (15 mg total) by mouth at bedtime as needed for sleep., Disp: 90 capsule, Rfl: 1 .  vitamin B-12 (CYANOCOBALAMIN) 1000 MCG tablet, Take 1 tablet (1,000 mcg total) by mouth daily., Disp: 90 tablet, Rfl: 1  No Known Allergies   ROS  Ten systems reviewed and is negative except as mentioned in HPI   Objective  Vitals:   07/20/16 1424  BP: 132/74  Pulse: 86  Resp: 16  Temp: 99.4 F (37.4 C)  SpO2: 96%  Weight: 167 lb 6 oz (75.9 kg)    Body mass index is 27.85 kg/m.  Physical Exam  Constitutional: Patient appears well-developed and well-nourished. Obese  No distress.  HEENT: head atraumatic, normocephalic, pupils equal and reactive to light, ears normal TM bilaterally, boggy turbinates, neck supple, throat within normal limits Cardiovascular: Normal rate, regular rhythm and normal heart sounds.  No murmur heard. No BLE edema. Pulmonary/Chest: Effort normal and breath sounds normal. No respiratory  distress. Abdominal: Soft.  There is no tenderness. Psychiatric: Patient has a normal mood and affect. behavior is normal. Judgment and thought content normal.  PHQ2/9: Depression screen [facility] 2/9 02/09/2016 08/10/2015 02/04/2015  Decreased Interest 0 0 0  Down, Depressed, Hopeless 0 0 0  PHQ - 2 Score 0 0 0     Fall Risk: Fall Risk  02/09/2016 08/10/2015 02/04/2015  Falls in the past year? No No No   Assessment & Plan  1. Cough  Went to Urgent Care 10 days ago and took antibiotics, tessalon Perles, prednisone but did not feel any better  - DG Chest 2 View; Future - guaiFENesin (MUCINEX) 600 MG 12 hr tablet; Take 2 tablets (1,200 mg total) by mouth 2 (two) times daily.  Dispense:  30 tablet; Refill: 0 - fluticasone furoate-vilanterol (BREO ELLIPTA) 100-25 MCG/INH AEPB; Inhale 1 puff into the lungs daily.  Dispense: 60 each; Refill: 0 - loratadine (CLARITIN) 10 MG tablet; Take 1 tablet (10 mg total) by mouth daily.  Dispense: 30 tablet; Refill: 0 - CBC with Differential/Platelet - COMPLETE METABOLIC PANEL WITH GFR - Brain natriuretic peptide  2. Body aches   3. SOB (shortness of breath)  - Brain natriuretic peptide   4. Depression, major, single episode, mild ([place])  She would like to try weaning off medication, she will stop Wellbutrin until next visit in April

## 2016-07-21 ENCOUNTER — Other Ambulatory Visit: Payer: Self-pay | Admitting: Family Medicine

## 2016-07-21 ENCOUNTER — Ambulatory Visit: Payer: Medicare HMO | Admitting: Family Medicine

## 2016-07-21 DIAGNOSIS — R748 Abnormal levels of other serum enzymes: Secondary | ICD-10-CM

## 2016-07-21 DIAGNOSIS — D649 Anemia, unspecified: Secondary | ICD-10-CM

## 2016-07-21 LAB — COMPLETE METABOLIC PANEL WITH GFR
ALBUMIN: 3 g/dL — AB (ref 3.6–5.1)
ALK PHOS: 138 U/L — AB (ref 33–130)
ALT: 41 U/L — ABNORMAL HIGH (ref 6–29)
AST: 48 U/L — ABNORMAL HIGH (ref 10–35)
BILIRUBIN TOTAL: 0.5 mg/dL (ref 0.2–1.2)
BUN: 9 mg/dL (ref 7–25)
CALCIUM: 8.3 mg/dL — AB (ref 8.6–10.4)
CO2: 24 mmol/L (ref 20–31)
CREATININE: 0.77 mg/dL (ref 0.50–0.99)
Chloride: 105 mmol/L (ref 98–110)
GFR, EST NON AFRICAN AMERICAN: 80 mL/min (ref >=60)
Glucose, Bld: 85 mg/dL (ref 65–99)
Potassium: 3.6 mmol/L (ref 3.5–5.3)
Sodium: 140 mmol/L (ref 135–146)
Total Protein: 6.3 g/dL (ref 6.1–8.1)

## 2016-07-21 LAB — BRAIN NATRIURETIC PEPTIDE: Brain Natriuretic Peptide: 94.5 pg/mL (ref <100)

## 2016-07-25 ENCOUNTER — Encounter: Admission: RE | Payer: Self-pay | Source: Ambulatory Visit

## 2016-07-25 ENCOUNTER — Ambulatory Visit: Admission: RE | Admit: 2016-07-25 | Payer: Medicare HMO | Source: Ambulatory Visit | Admitting: Gastroenterology

## 2016-07-25 SURGERY — IMAGING PROCEDURE, GI TRACT, INTRALUMINAL, VIA CAPSULE

## 2016-07-27 ENCOUNTER — Ambulatory Visit (INDEPENDENT_AMBULATORY_CARE_PROVIDER_SITE_OTHER): Payer: Medicare HMO | Admitting: Gastroenterology

## 2016-07-27 ENCOUNTER — Encounter: Payer: Self-pay | Admitting: Gastroenterology

## 2016-07-27 ENCOUNTER — Other Ambulatory Visit: Payer: Self-pay

## 2016-07-27 VITALS — BP 127/70 | HR 74 | Temp 98.6°F | Ht 65.0 in | Wt 164.4 lb

## 2016-07-27 DIAGNOSIS — E538 Deficiency of other specified B group vitamins: Secondary | ICD-10-CM | POA: Diagnosis not present

## 2016-07-27 DIAGNOSIS — D508 Other iron deficiency anemias: Secondary | ICD-10-CM | POA: Diagnosis not present

## 2016-07-27 DIAGNOSIS — D509 Iron deficiency anemia, unspecified: Secondary | ICD-10-CM

## 2016-07-27 DIAGNOSIS — D72821 Monocytosis (symptomatic): Secondary | ICD-10-CM

## 2016-07-27 NOTE — Progress Notes (Signed)
Primary Care Physician: Loistine Chance, MD  Primary Gastroenterologist:  Dr. Jonathon Bellows    HPI: Alexandria [facility] is a 66 y.o. female    She is here today to follow up to her last visit on 03/2016.She is being evaluated for iron deficiency anemia.  Summary of history  Ua, parietal cell ab,IF antibody ,TSH,celiac serology, MMA,Homocysteine -negative or normal  EGD,colonoscopy were normal .   Treated empirically for small bowel bacterial overgrowth with Bactrim, Flagyl for 10 days. I am awaiting a capsule study which she has rescheduled on a few occasions.   Hb 7 days back was 10.8 grams with a monocyte count of 1012  Today she says she has upto 8 bowel movements a day and some days 3-4 bowel movements, sloppy in nature but not watery . She says she feels very tired. She has not received her b12 shots. Not tried a fiber supplement.   Current Outpatient Prescriptions  Medication Sig Dispense Refill  . acetaminophen (TYLENOL) 325 MG tablet Take 2 tablets (650 mg total) by mouth every 6 (six) hours as needed for mild pain (or Temp > 100).    . ALPRAZolam (XANAX) 0.5 MG tablet Take 0.5-1 tablets (0.25-0.5 mg total) by mouth daily as needed for anxiety (Must last patient for 3 months). 30 tablet 0  . aspirin (ASPIRIN CHILDRENS) 81 MG chewable tablet Chew 1 tablet (81 mg total) by mouth daily. (Patient not taking: Reported on 03/21/2016) 30 tablet 0  . atenolol (TENORMIN) 25 MG tablet Take 1 tablet (25 mg total) by mouth daily. 90 tablet 1  . atorvastatin (LIPITOR) 40 MG tablet Take 1 tablet (40 mg total) by mouth every evening. 90 tablet 1  . Cholecalciferol (VITAMIN D) 2000 units CAPS Take 1 capsule (2,000 Units total) by mouth daily. 30 capsule 0  . citalopram (CELEXA) 20 MG tablet Take 1 tablet (20 mg total) by mouth daily. 90 tablet 1  . fluticasone furoate-vilanterol (BREO ELLIPTA) 100-25 MCG/INH AEPB Inhale 1 puff into the lungs daily. 60 each 0  . guaiFENesin (MUCINEX)  600 MG 12 hr tablet Take 2 tablets (1,200 mg total) by mouth 2 (two) times daily. 30 tablet 0  . lisinopril (PRINIVIL,ZESTRIL) 20 MG tablet TAKE 1 TABLET EVERY DAY 90 tablet 0  . loratadine (CLARITIN) 10 MG tablet Take 1 tablet (10 mg total) by mouth daily. 30 tablet 0  . temazepam (RESTORIL) 15 MG capsule Take 1 capsule (15 mg total) by mouth at bedtime as needed for sleep. 90 capsule 1  . vitamin B-12 (CYANOCOBALAMIN) 1000 MCG tablet Take 1 tablet (1,000 mcg total) by mouth daily. 90 tablet 1   No current facility-administered medications for this visit.     Allergies as of 07/27/2016  . (No Known Allergies)    ROS:  General: Negative for anorexia, weight loss, fever, chills, fatigue, weakness. ENT: Negative for hoarseness, difficulty swallowing , nasal congestion. CV: Negative for chest pain, angina, palpitations, dyspnea on exertion, peripheral edema.  Respiratory: Negative for dyspnea at rest, dyspnea on exertion, cough, sputum, wheezing.  GI: See history of present illness. GU:  Negative for dysuria, hematuria, urinary incontinence, urinary frequency, nocturnal urination.  Endo: Negative for unusual weight change.    Physical Examination:   There were no vitals taken for this visit.  General: Well-nourished, well-developed in no acute distress.  Eyes: No icterus. Conjunctivae pink. Mouth: Oropharyngeal mucosa moist and pink , no lesions erythema or exudate. Lungs: Clear to auscultation bilaterally. Non-labored. Heart: Regular  rate and rhythm, no murmurs rubs or gallops.  Abdomen: Bowel sounds are normal, nontender, nondistended, no hepatosplenomegaly or masses, no abdominal bruits or hernia , no rebound or guarding.   Extremities: No lower extremity edema. No clubbing or deformities. Neuro: Alert and oriented x 3.  Grossly intact. Skin: Warm and dry, no jaundice.   Psych: Alert and cooperative, normal mood and affect.  Labs:   Imaging Studies: Dg Chest 2 View  Result  Date: 07/20/2016 CLINICAL DATA:  Cough for 1 month.  Shortness of Breath EXAM: CHEST  2 VIEW COMPARISON:  November 06, 2013 FINDINGS: There is no edema or consolidation. The heart size and pulmonary vascularity are normal. No adenopathy. No bone lesions. There is colonic interposition between the right hemidiaphragm and liver. IMPRESSION: No edema or consolidation. Electronically Signed   By: Lowella Grip III M.D.   On: 07/20/2016 16:18    Assessment and Plan:  Alexandria Newman is a 65 y.o. y/o female being followed for iron deficiency anemia. Likely fatigue from b12,iron deficiency . Other etiologies to evaluate in the future include OSA. Diarrhea likely secondary to smal bowel bacterial overgrowth.   1. Iron deficiency anemia : Normal EGD+colonoscopy - will need capsule study(after a small bowel follow through to r/o obstruction ) to complete evaluation, she has not scheduled the same so far and is not very keen  - likely related to gastric bypass that she is not absorbing oral iron  .Since her Hb is no better with oral iron she will need IV iron.   2. 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 Commence   parenteral route.(previously suggested but not received.)  3. Elevated monocyte count on recent CBCD- suggest evaluation with hematology when she is seen for IV iron. Likely will need repeat CBC to see if persists  3. Change in bowel habits: trial of fiber supplements- samples provided   Dr Jonathon Bellows  MD Follow up in 3 months

## 2016-07-29 ENCOUNTER — Other Ambulatory Visit: Payer: Self-pay

## 2016-08-01 ENCOUNTER — Other Ambulatory Visit: Payer: Self-pay | Admitting: Family Medicine

## 2016-08-01 DIAGNOSIS — I10 Essential (primary) hypertension: Secondary | ICD-10-CM

## 2016-08-02 ENCOUNTER — Encounter: Payer: Self-pay | Admitting: Family Medicine

## 2016-08-03 ENCOUNTER — Ambulatory Visit (INDEPENDENT_AMBULATORY_CARE_PROVIDER_SITE_OTHER): Payer: Medicare HMO

## 2016-08-03 DIAGNOSIS — E538 Deficiency of other specified B group vitamins: Secondary | ICD-10-CM

## 2016-08-03 MED ORDER — CYANOCOBALAMIN 1000 MCG/ML IJ SOLN
1000.0000 ug | Freq: Once | INTRAMUSCULAR | Status: AC
  Administered 2016-08-03: 1000 ug via INTRAMUSCULAR

## 2016-08-08 ENCOUNTER — Other Ambulatory Visit: Payer: Self-pay | Admitting: Family Medicine

## 2016-08-08 DIAGNOSIS — F320 Major depressive disorder, single episode, mild: Secondary | ICD-10-CM

## 2016-08-08 DIAGNOSIS — E782 Mixed hyperlipidemia: Secondary | ICD-10-CM

## 2016-08-09 ENCOUNTER — Encounter: Payer: Self-pay | Admitting: Family Medicine

## 2016-08-09 ENCOUNTER — Ambulatory Visit (INDEPENDENT_AMBULATORY_CARE_PROVIDER_SITE_OTHER): Payer: Medicare HMO | Admitting: Family Medicine

## 2016-08-09 VITALS — BP 128/82 | HR 76 | Temp 98.2°F | Resp 16 | Ht 65.0 in | Wt 163.1 lb

## 2016-08-09 DIAGNOSIS — I10 Essential (primary) hypertension: Secondary | ICD-10-CM

## 2016-08-09 DIAGNOSIS — E782 Mixed hyperlipidemia: Secondary | ICD-10-CM | POA: Diagnosis not present

## 2016-08-09 DIAGNOSIS — F320 Major depressive disorder, single episode, mild: Secondary | ICD-10-CM

## 2016-08-09 DIAGNOSIS — R748 Abnormal levels of other serum enzymes: Secondary | ICD-10-CM | POA: Diagnosis not present

## 2016-08-09 DIAGNOSIS — R739 Hyperglycemia, unspecified: Secondary | ICD-10-CM | POA: Diagnosis not present

## 2016-08-09 DIAGNOSIS — D508 Other iron deficiency anemias: Secondary | ICD-10-CM | POA: Diagnosis not present

## 2016-08-09 DIAGNOSIS — R5383 Other fatigue: Secondary | ICD-10-CM

## 2016-08-09 DIAGNOSIS — E8881 Metabolic syndrome: Secondary | ICD-10-CM | POA: Diagnosis not present

## 2016-08-09 DIAGNOSIS — F5101 Primary insomnia: Secondary | ICD-10-CM | POA: Diagnosis not present

## 2016-08-09 DIAGNOSIS — Z9884 Bariatric surgery status: Secondary | ICD-10-CM | POA: Diagnosis not present

## 2016-08-09 LAB — CBC WITH DIFFERENTIAL/PLATELET
BASOS PCT: 1 %
Basophils Absolute: 72 cells/uL (ref 0–200)
Eosinophils Absolute: 72 cells/uL (ref 15–500)
Eosinophils Relative: 1 %
HCT: 36.5 % (ref 35.0–45.0)
Hemoglobin: 11.3 g/dL — ABNORMAL LOW (ref 11.7–15.5)
LYMPHS PCT: 40 %
Lymphs Abs: 2880 cells/uL (ref 850–3900)
MCH: 26.7 pg — ABNORMAL LOW (ref 27.0–33.0)
MCHC: 31 g/dL — ABNORMAL LOW (ref 32.0–36.0)
MCV: 86.3 fL (ref 80.0–100.0)
MONOS PCT: 8 %
MPV: 9 fL (ref 7.5–12.5)
Monocytes Absolute: 576 cells/uL (ref 200–950)
Neutro Abs: 3600 cells/uL (ref 1500–7800)
Neutrophils Relative %: 50 %
PLATELETS: 360 10*3/uL (ref 140–400)
RBC: 4.23 MIL/uL (ref 3.80–5.10)
RDW: 14.6 % (ref 11.0–15.0)
WBC: 7.2 10*3/uL (ref 3.8–10.8)

## 2016-08-09 LAB — COMPLETE METABOLIC PANEL WITH GFR
ALBUMIN: 3.5 g/dL — AB (ref 3.6–5.1)
ALK PHOS: 101 U/L (ref 33–130)
ALT: 24 U/L (ref 6–29)
AST: 29 U/L (ref 10–35)
BUN: 7 mg/dL (ref 7–25)
CALCIUM: 9 mg/dL (ref 8.6–10.4)
CO2: 26 mmol/L (ref 20–31)
CREATININE: 0.71 mg/dL (ref 0.50–0.99)
Chloride: 105 mmol/L (ref 98–110)
GFR, Est African American: >89 mL/min (ref >=60)
GFR, Est Non African American: 88 mL/min (ref >=60)
Glucose, Bld: 83 mg/dL (ref 65–99)
Potassium: 3.8 mmol/L (ref 3.5–5.3)
Sodium: 143 mmol/L (ref 135–146)
Total Bilirubin: 0.4 mg/dL (ref 0.2–1.2)
Total Protein: 7 g/dL (ref 6.1–8.1)

## 2016-08-09 MED ORDER — BUPROPION HCL ER (XL) 150 MG PO TB24: 150.0000 mg | ORAL_TABLET | Freq: Every day | ORAL | 1 refills | Status: DC

## 2016-08-09 MED ORDER — TEMAZEPAM 15 MG PO CAPS: 15.0000 mg | ORAL_CAPSULE | Freq: Every evening | ORAL | 1 refills | Status: DC | PRN

## 2016-08-09 MED ORDER — ALPRAZOLAM 0.5 MG PO TABS: 0.2500 mg | ORAL_TABLET | Freq: Every day | ORAL | 0 refills | Status: DC | PRN

## 2016-08-09 NOTE — Progress Notes (Signed)
Name: Alexandria Newman   MRN: [id number]    DOB: 07-27-1950   Date:08/09/2016       Progress Note  Subjective  Chief Complaint  Chief Complaint  Patient presents with  . Hypertension    6 month follow up    HPI  HTN: she is taking medication and denies side effects. No chest pain or palpation, but has noticed some dizziness - denies spinning sensation. Advised to stay hydrated.   Hyperlipidemia: taking Lipitor and denies side effects of medication. No myalgias or chest pain.  Depression Mild  episode: she has been on medication for years. She started taking medication when she had an argument with her sister years ago . She gets along well with her husband. She denies crying spells but she has noticed some anhedonia, but making self get out of the house - taking care of 7 month grandchild because son is unable to take care of him, mother of the child on drugs. She is worried about her adult son - recently diagnosed with Schizophrenia. He stopped medication - and is back doing drugs again.  Insomnia: taking Alprazolam prn when very stressed, no longer at night, taking Temazepam, and is doing well.   Metabolic Syndrome: last [id number] 6.1 , she denies polyphagia, polydipsia . She always has polyuria. She has gained weight   Gastric bypass:  Almost 10 years ago, she is not taking Vitamin D but has been taking B12 supplementation but not on a regular basis, she took iron pills without help, so will have iron infusion soon.     Dumping syndrome: since bowel obstruction and partial resection in 2015, 2 taking Imodium prn, we tried Questran but she could not tolerate the taste. She was seen by Dr. Vicente Males and is taking a fiber supplement and it has helped.   Senile Purpura: she still bruises on both arms, gave her reassurance  Cough: improved with Breo, only dry cough now. Monocytes elevated we will recheck , liver enzymes also slightly bumped a couple of weeks during viral illness  and we will recheck it  Patient Active Problem List   Diagnosis Date Noted  . Iron deficiency anemia 02/15/2016  . Senile purpura ([place]) 08/10/2015  . Hyperlipidemia 02/04/2015  . History of small bowel obstruction 02/04/2015  . Hypertension, benign 02/04/2015  . DDD (degenerative disc disease), cervical 02/04/2015  . 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  . Insomnia 02/04/2015  . Osteoarthritis of finger of right hand 02/04/2015  . Metabolic syndrome 91/47/8295  . Depression, major, single episode, mild ([place]) 02/04/2015  . 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 Vitamin D deficiency 02/04/2015  . Vitamin B12 deficiency 02/04/2015  . Bariatric surgery status     Past Surgical History:  Procedure Laterality Date  . ABDOMINAL HYSTERECTOMY    . APPENDECTOMY    . COLONOSCOPY WITH PROPOFOL N/A 03/21/2016   Procedure: COLONOSCOPY WITH PROPOFOL;  Surgeon: Jonathon Bellows, MD;  Location: [facility] ENDOSCOPY;  Service: Endoscopy;  Laterality: N/A;  . ESOPHAGOGASTRODUODENOSCOPY (EGD) WITH PROPOFOL N/A 03/21/2016   Procedure: ESOPHAGOGASTRODUODENOSCOPY (EGD) WITH PROPOFOL;  Surgeon: Jonathon Bellows, MD;  Location: [facility] ENDOSCOPY;  Service: Endoscopy;  Laterality: N/A;  . GASTRIC BYPASS  2009  . LAPAROSCOPY N/A 01/05/2014   Procedure: LAPAROSCOPY DIAGNOSTIC CONVERTED TO  LAPAROTOMY ILECTOMY;  Surgeon: Erroll Luna, MD;  Location: [place] [facility];  Service: General;  Laterality: N/A;    Family History  Problem Relation Age of Onset  . Heart disease Mother   . CAD Mother   .  Kidney disease Mother   . Heart attack Father   . Obesity Sister   . Heart attack Brother     after surgery    Social History   Social History  . Marital status: Married    Spouse name: N/A  . Number of children: N/A  . Years of education: N/A   Occupational History  . Not on file.   Social History Main Topics  . Smoking status: Never Smoker  . Smokeless tobacco: Never Used  . Alcohol use 0.0 oz/week      Comment: occasionally drink  . Drug use: No  . Sexual activity: No   Other Topics Concern  . Not on file   Social History Narrative  . No narrative on file     Current Outpatient Prescriptions:  .  acetaminophen (TYLENOL) 325 MG tablet, Take 2 tablets (650 mg total) by mouth every 6 (six) hours as needed for mild pain (or Temp > 100)., Disp: , Rfl:  .  ALPRAZolam (XANAX) 0.5 MG tablet, Take 0.5-1 tablets (0.25-0.5 mg total) by mouth daily as needed for anxiety (Must last patient for 3 months). (Patient not taking: Reported on 07/27/2016), Disp: 30 tablet, Rfl: 0 .  aspirin (ASPIRIN CHILDRENS) 81 MG chewable tablet, Chew 1 tablet (81 mg total) by mouth daily., Disp: 30 tablet, Rfl: 0 .  atenolol (TENORMIN) 25 MG tablet, Take 1 tablet (25 mg total) by mouth daily., Disp: 90 tablet, Rfl: 1 .  atorvastatin (LIPITOR) 40 MG tablet, TAKE 1 TABLET EVERY EVENING., Disp: 90 tablet, Rfl: 1 .  Cholecalciferol (VITAMIN D) 2000 units CAPS, Take 1 capsule (2,000 Units total) by mouth daily., Disp: 30 capsule, Rfl: 0 .  citalopram (CELEXA) 20 MG tablet, TAKE 1 TABLET EVERY DAY, Disp: 90 tablet, Rfl: 1 .  fluticasone furoate-vilanterol (BREO ELLIPTA) 100-25 MCG/INH AEPB, Inhale 1 puff into the lungs daily., Disp: 60 each, Rfl: 0 .  guaiFENesin (MUCINEX) 600 MG 12 hr tablet, Take 2 tablets (1,200 mg total) by mouth 2 (two) times daily. (Patient not taking: Reported on 07/27/2016), Disp: 30 tablet, Rfl: 0 .  lisinopril (PRINIVIL,ZESTRIL) 20 MG tablet, TAKE 1 TABLET EVERY DAY, Disp: 90 tablet, Rfl: 0 .  loratadine (CLARITIN) 10 MG tablet, Take 1 tablet (10 mg total) by mouth daily., Disp: 30 tablet, Rfl: 0 .  temazepam (RESTORIL) 15 MG capsule, Take 1 capsule (15 mg total) by mouth at bedtime as needed for sleep., Disp: 90 capsule, Rfl: 1 .  vitamin B-12 (CYANOCOBALAMIN) 1000 MCG tablet, Take 1 tablet (1,000 mcg total) by mouth daily., Disp: 90 tablet, Rfl: 1  No Known Allergies   ROS  Constitutional:  Negative for fever or weight change.  Respiratory: Positive  for cough no  shortness of breath.   Cardiovascular: Negative for chest pain or palpitations.  Gastrointestinal: Negative for abdominal pain, no bowel changes.  Musculoskeletal: Negative for gait problem or joint swelling.  Skin: Negative for rash.  Neurological: Negative for dizziness or headache.  No other specific complaints in a complete review of systems (except as listed in HPI above).  Objective  Vitals:   08/09/16 0809  BP: 128/82  Pulse: 76  Resp: 16  Temp: 98.2 F (36.8 C)  SpO2: 94%  Weight: 163 lb 2 oz (74 kg)  Height: _0  (1.651 m)    Body mass index is 27.15 kg/m.  Physical Exam  Constitutional: Patient appears well-developed and well-nourished. Obese  No distress.  HEENT: head atraumatic, normocephalic, pupils equal and  reactive to light, ears normal TM bilaterally, neck supple, throat within normal limits Cardiovascular: Normal rate, regular rhythm and normal heart sounds.  No murmur heard. No BLE edema. Pulmonary/Chest: Effort normal and breath sounds normal. No respiratory distress. Abdominal: Soft.  There is no tenderness. Psychiatric: Patient has a normal mood and affect. behavior is normal. Judgment and thought content normal.  Recent Results (from the past 2160 hour(s))  CBC with Differential/Platelet     Status: Abnormal   Collection Time: 07/20/16  3:04 PM  Result Value Ref Range   WBC 9.2 3.8 - 10.8 K/uL   RBC 3.86 3.80 - 5.10 MIL/uL   Hemoglobin 10.8 (L) 11.7 - 15.5 g/dL   HCT 33.1 (L) 35.0 - 45.0 %   MCV 85.8 80.0 - 100.0 fL   MCH 28.0 27.0 - 33.0 pg   MCHC 32.6 32.0 - 36.0 g/dL   RDW 14.5 11.0 - 15.0 %   Platelets 227 140 - 400 K/uL   MPV 9.5 7.5 - 12.5 fL   Neutro Abs 4,324 1,500 - 7,800 cells/uL   Lymphs Abs 3,864 850 - 3,900 cells/uL   Monocytes Absolute 1,012 (H) 200 - 950 cells/uL   Eosinophils Absolute 0 (L) 15 - 500 cells/uL   Basophils Absolute 0 0 - 200 cells/uL    Neutrophils Relative % 47 %   Lymphocytes Relative 42 %   Monocytes Relative 11 %   Eosinophils Relative 0 %   Basophils Relative 0 %   Smear Review Criteria for review not met   COMPLETE METABOLIC PANEL WITH GFR     Status: Abnormal   Collection Time: 07/20/16  3:04 PM  Result Value Ref Range   Sodium 140 135 - 146 mmol/L   Potassium 3.6 3.5 - 5.3 mmol/L   Chloride 105 98 - 110 mmol/L   CO2 24 20 - 31 mmol/L   Glucose, Bld 85 65 - 99 mg/dL   BUN 9 7 - 25 mg/dL   Creat 0.77 0.50 - 0.99 mg/dL    Comment:   For patients > or = 68 years of age: The upper reference limit for Creatinine is approximately 13% higher for people identified as African-American.      Total Bilirubin 0.5 0.2 - 1.2 mg/dL   Alkaline Phosphatase 138 (H) 33 - 130 U/L   AST 48 (H) 10 - 35 U/L   ALT 41 (H) 6 - 29 U/L   Total Protein 6.3 6.1 - 8.1 g/dL   Albumin 3.0 (L) 3.6 - 5.1 g/dL   Calcium 8.3 (L) 8.6 - 10.4 mg/dL   GFR, Est African American >89 >=60 mL/min   GFR, Est Non African American 80 >=60 mL/min  Brain natriuretic peptide     Status: None   Collection Time: 07/20/16  3:04 PM  Result Value Ref Range   Brain Natriuretic Peptide 94.5 <100 pg/mL    Comment:   BNP levels increase with age in the general population with the highest values seen in individuals greater than 11 years of age. Reference: Joellyn Rued Cardiol 2002; 93:734-28.         PHQ2/9: Depression screen [facility] 2/9 08/09/2016 02/09/2016 08/10/2015 02/04/2015  Decreased Interest 0 0 0 0  Down, Depressed, Hopeless 0 0 0 0  PHQ - 2 Score 0 0 0 0    Fall Risk: Fall Risk  08/09/2016 02/09/2016 08/10/2015 02/04/2015  Falls in the past year? No No No No    Functional Status Survey: Is the patient  deaf or have difficulty hearing?: No Does the patient have difficulty seeing, even when wearing glasses/contacts?: No Does the patient have difficulty concentrating, remembering, or making decisions?: No Does the patient have difficulty walking  or climbing stairs?: No Does the patient have difficulty dressing or bathing?: No Does the patient have difficulty doing errands alone such as visiting a doctor's office or shopping?: No   Assessment & Plan  1. Hypertension, benign  Well controlled with medication   2. Metabolic syndrome  Recheck yearly   3. Mixed hyperlipidemia  On life style modification  4. Hyperglycemia  She does not want to recheck labs today  5. H/O gastric bypass  With low albumin level   6. Depression, major, single episode, mild ([place])  She decided not to stop Wellbutrin XL.  - ALPRAZolam (XANAX) 0.5 MG tablet; Take 0.5-1 tablets (0.25-0.5 mg total) by mouth daily as needed for anxiety (Must last patient for 3 months).  Dispense: 30 tablet; Refill: 0  7. Other fatigue  - Epstein-Barr virus VCA antibody panel  8. Elevated liver enzymes  - COMPLETE METABOLIC PANEL WITH GFR  9. Other iron deficiency anemia  - CBC with Differential/Platelet - Iron, TIBC and Ferritin Panel  10. Primary insomnia  - temazepam (RESTORIL) 15 MG capsule; Take 1 capsule (15 mg total) by mouth at bedtime as needed for sleep.  Dispense: 90 capsule; Refill: 1  She states not taking it every night, advised to try taking it prn

## 2016-08-10 LAB — IRON,TIBC AND FERRITIN PANEL
%SAT: 14 % (ref 11–50)
FERRITIN: 23 ng/mL (ref 20–288)
Iron: 55 ug/dL (ref 45–160)
TIBC: 381 ug/dL (ref 250–450)

## 2016-08-10 LAB — EPSTEIN-BARR VIRUS VCA ANTIBODY PANEL

## 2016-08-18 ENCOUNTER — Inpatient Hospital Stay: Payer: Medicare HMO | Attending: Oncology | Admitting: Oncology

## 2016-08-18 ENCOUNTER — Inpatient Hospital Stay: Payer: Medicare HMO

## 2016-08-18 ENCOUNTER — Encounter: Payer: Self-pay | Admitting: Oncology

## 2016-08-18 VITALS — BP 143/78 | HR 72 | Temp 98.3°F | Resp 18 | Ht 65.0 in | Wt 163.1 lb

## 2016-08-18 DIAGNOSIS — Z8719 Personal history of other diseases of the digestive system: Secondary | ICD-10-CM | POA: Diagnosis not present

## 2016-08-18 DIAGNOSIS — I10 Essential (primary) hypertension: Secondary | ICD-10-CM | POA: Insufficient documentation

## 2016-08-18 DIAGNOSIS — E559 Vitamin D deficiency, unspecified: Secondary | ICD-10-CM | POA: Insufficient documentation

## 2016-08-18 DIAGNOSIS — E8881 Metabolic syndrome: Secondary | ICD-10-CM | POA: Insufficient documentation

## 2016-08-18 DIAGNOSIS — M26609 Unspecified temporomandibular joint disorder, unspecified side: Secondary | ICD-10-CM | POA: Diagnosis not present

## 2016-08-18 DIAGNOSIS — D692 Other nonthrombocytopenic purpura: Secondary | ICD-10-CM | POA: Insufficient documentation

## 2016-08-18 DIAGNOSIS — D509 Iron deficiency anemia, unspecified: Secondary | ICD-10-CM | POA: Diagnosis not present

## 2016-08-18 DIAGNOSIS — E2839 Other primary ovarian failure: Secondary | ICD-10-CM | POA: Diagnosis not present

## 2016-08-18 DIAGNOSIS — E538 Deficiency of other specified B group vitamins: Secondary | ICD-10-CM | POA: Diagnosis not present

## 2016-08-18 DIAGNOSIS — D508 Other iron deficiency anemias: Secondary | ICD-10-CM

## 2016-08-18 DIAGNOSIS — F329 Major depressive disorder, single episode, unspecified: Secondary | ICD-10-CM | POA: Diagnosis not present

## 2016-08-18 DIAGNOSIS — E7800 Pure hypercholesterolemia, unspecified: Secondary | ICD-10-CM | POA: Insufficient documentation

## 2016-08-18 DIAGNOSIS — Z9884 Bariatric surgery status: Secondary | ICD-10-CM | POA: Diagnosis not present

## 2016-08-18 DIAGNOSIS — G4700 Insomnia, unspecified: Secondary | ICD-10-CM | POA: Diagnosis not present

## 2016-08-18 DIAGNOSIS — E669 Obesity, unspecified: Secondary | ICD-10-CM | POA: Diagnosis not present

## 2016-08-18 DIAGNOSIS — M19041 Primary osteoarthritis, right hand: Secondary | ICD-10-CM

## 2016-08-18 DIAGNOSIS — M5030 Other cervical disc degeneration, unspecified cervical region: Secondary | ICD-10-CM | POA: Diagnosis not present

## 2016-08-18 DIAGNOSIS — E785 Hyperlipidemia, unspecified: Secondary | ICD-10-CM | POA: Diagnosis not present

## 2016-08-18 DIAGNOSIS — Z79899 Other long term (current) drug therapy: Secondary | ICD-10-CM | POA: Diagnosis not present

## 2016-08-18 DIAGNOSIS — F419 Anxiety disorder, unspecified: Secondary | ICD-10-CM | POA: Insufficient documentation

## 2016-08-18 MED ORDER — SODIUM CHLORIDE 0.9 % IV SOLN
510.0000 mg | Freq: Once | INTRAVENOUS | Status: AC
  Administered 2016-08-18: 510 mg via INTRAVENOUS
  Filled 2016-08-18: qty 17

## 2016-08-18 MED ORDER — SODIUM CHLORIDE 0.9 % IV SOLN
Freq: Once | INTRAVENOUS | Status: AC
  Administered 2016-08-18: 15:00:00 via INTRAVENOUS
  Filled 2016-08-18: qty 1000

## 2016-08-18 NOTE — Progress Notes (Signed)
Hematology/Oncology Consult note [facility] Telephone:([phone] [phone]  Patient Care Team: Steele Sizer, MD as PCP - General (Family Medicine)   Name of the patient: Alexandria Newman  [id number]  Aug 03, 1950    Reason for referral- iron deficiency anemia   Referring physician- Dr. Jonathon Bellows  Date of visit: 08/18/16   History of presenting illness- patient is a 66 year old female with a history of gastric bypass about 10 years ago. She has been taking oral iron and B12 intermittently. She was evaluated by Dr. Rufina Falco for iron deficiency anemia and underwent EGD and colonoscopy recently which were normal. She is yet to have capsule endoscopy. She did have further workup from his side for iron deficiency anemia. Her recent hemoglobin from your 17 2018 showed white count of 7.2, H&H of 11.3/36.5 with a platelet count of 360. Her hemoglobin has been ranging between 9-10 prior to that. In December 2017 her ferritin was 11 and iron studies showed a low iron saturation of 10%. TIBC at that time was normal at 418. Repeat iron studies from 08/09/2016 showed ferritin of 23, iron saturation was low normal at 14%. Patient reports feeling well and denies any fatigue or other symptoms. Patient was also found to have low borderline levels of B12 and is currently getting monthly B12 through Dr. Ancil Boozer. On one occasion patient was noted to have monocytosis on her CBC with an absolute monocyte count of 1012  ECOG PS- 0  Pain scale- 0  Review of systems- Review of Systems  Constitutional: Negative for chills, fever, malaise/fatigue and weight loss.  HENT: Negative for congestion, ear discharge and nosebleeds.   Eyes: Negative for blurred vision.  Respiratory: Negative for cough, hemoptysis, sputum production, shortness of breath and wheezing.   Cardiovascular: Negative for chest pain, palpitations, orthopnea and claudication.  Gastrointestinal: Negative for  abdominal pain, blood in stool, constipation, diarrhea, heartburn, melena, nausea and vomiting.  Genitourinary: Negative for dysuria, flank pain, frequency, hematuria and urgency.  Musculoskeletal: Negative for back pain, joint pain and myalgias.  Skin: Negative for rash.  Neurological: Negative for dizziness, tingling, focal weakness, seizures, weakness and headaches.  Endo/Heme/Allergies: Does not bruise/bleed easily.  Psychiatric/Behavioral: Negative for depression and suicidal ideas. The patient does not have insomnia.     No Known Allergies  Patient Active Problem List   Diagnosis Date Noted  . Iron deficiency anemia 02/15/2016  . Senile purpura ([place]) 08/10/2015  . Hyperlipidemia 02/04/2015  . History of small bowel obstruction 02/04/2015  . Hypertension, benign 02/04/2015  . DDD (degenerative disc disease), cervical 02/04/2015  . 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  . Insomnia 02/04/2015  . Osteoarthritis of finger of right hand 02/04/2015  . Metabolic syndrome 84/69/6295  . Depression, major, single episode, mild ([place]) 02/04/2015  . 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 Vitamin D deficiency 02/04/2015  . Vitamin B12 deficiency 02/04/2015  . Bariatric surgery status      Past Medical History:  Diagnosis Date  . Anemia   . Anxiety   . B12 deficiency   . Chronic insomnia   . Degeneration of cervical intervertebral disc   . Depression   . Dumping syndrome 2016   per pt had "part of intestines removed"  . Dysfunction of both eustachian tubes   . Hypercholesteremia   . Hypertension   . Metabolic syndrome   . Obesity   . Osteoarthritis of right hand   . Ovarian failure   . TMJ (dislocation of temporomandibular joint)   .  Vitamin D deficiency      Past Surgical History:  Procedure Laterality Date  . ABDOMINAL HYSTERECTOMY    . APPENDECTOMY    . COLONOSCOPY WITH PROPOFOL N/A 03/21/2016   Procedure: COLONOSCOPY WITH PROPOFOL;  Surgeon: Wyline Mood, MD;  Location: [facility] ENDOSCOPY;  Service: Endoscopy;  Laterality: N/A;  . ESOPHAGOGASTRODUODENOSCOPY (EGD) WITH PROPOFOL N/A 03/21/2016   Procedure: ESOPHAGOGASTRODUODENOSCOPY (EGD) WITH PROPOFOL;  Surgeon: Wyline Mood, MD;  Location: [facility] ENDOSCOPY;  Service: Endoscopy;  Laterality: N/A;  . GASTRIC BYPASS  2009  . LAPAROSCOPY N/A 01/05/2014   Procedure: LAPAROSCOPY DIAGNOSTIC CONVERTED TO  LAPAROTOMY ILECTOMY;  Surgeon: Harriette Bouillon, MD;  Location: [facility] OR;  Service: General;  Laterality: N/A;    Social History   Social History  . Marital status: Married    Spouse name: N/A  . Number of children: N/A  . Years of education: N/A   Occupational History  . Not on file.   Social History Main Topics  . Smoking status: Never Smoker  . Smokeless tobacco: Never Used  . Alcohol use 0.0 oz/week     Comment: occasionally drink  . Drug use: No  . Sexual activity: No   Other Topics Concern  . Not on file   Social History Narrative  . No narrative on file     Family History  Problem Relation Age of Onset  . Heart disease Mother   . CAD Mother   . Kidney disease Mother   . Heart attack Father   . Obesity Sister   . Heart attack Brother     after surgery     Current Outpatient Prescriptions:  .  acetaminophen (TYLENOL) 325 MG tablet, Take 2 tablets (650 mg total) by mouth every 6 (six) hours as needed for mild pain (or Temp > 100)., Disp: , Rfl:  .  ALPRAZolam (XANAX) 0.5 MG tablet, Take 0.5-1 tablets (0.25-0.5 mg total) by mouth daily as needed for anxiety (Must last patient for 3 months)., Disp: 30 tablet, Rfl: 0 .  atenolol (TENORMIN) 25 MG tablet, Take 1 tablet (25 mg total) by mouth daily., Disp: 90 tablet, Rfl: 1 .  atorvastatin (LIPITOR) 40 MG tablet, TAKE 1 TABLET EVERY EVENING., Disp: 90 tablet, Rfl: 1 .  buPROPion (WELLBUTRIN XL) 150 MG 24 hr tablet, Take 1 tablet (150 mg total) by mouth daily., Disp: 90 tablet, Rfl: 1 .  citalopram (CELEXA) 20 MG tablet, TAKE 1  TABLET EVERY DAY, Disp: 90 tablet, Rfl: 1 .  lisinopril (PRINIVIL,ZESTRIL) 20 MG tablet, TAKE 1 TABLET EVERY DAY, Disp: 90 tablet, Rfl: 0 .  loratadine (CLARITIN) 10 MG tablet, Take 1 tablet (10 mg total) by mouth daily., Disp: 30 tablet, Rfl: 0 .  temazepam (RESTORIL) 15 MG capsule, Take 1 capsule (15 mg total) by mouth at bedtime as needed for sleep., Disp: 90 capsule, Rfl: 1 .  vitamin B-12 (CYANOCOBALAMIN) 1000 MCG tablet, Take 1 tablet (1,000 mcg total) by mouth daily., Disp: 90 tablet, Rfl: 1 .  Cholecalciferol (VITAMIN D) 2000 units CAPS, Take 1 capsule (2,000 Units total) by mouth daily. (Patient not taking: Reported on 08/18/2016), Disp: 30 capsule, Rfl: 0   Physical exam:  Vitals:   08/18/16 1420  BP: (!) 143/78  Pulse: 72  Resp: 18  Temp: 98.3 F (36.8 C)  TempSrc: Tympanic  Weight: 163 lb 1.6 oz (74 kg)  Height: 5\' 5"  (1.651 m)   Physical Exam  Constitutional: She is oriented to person, place, and time and well-developed, well-nourished,  and in no distress.  HENT:  Head: Normocephalic and atraumatic.  Eyes: EOM are normal. Pupils are equal, round, and reactive to light.  Neck: Normal range of motion.  Cardiovascular: Normal rate, regular rhythm and normal heart sounds.   Pulmonary/Chest: Effort normal and breath sounds normal.  Abdominal: Soft. Bowel sounds are normal.  Neurological: She is alert and oriented to person, place, and time.  Skin: Skin is warm and dry.       CMP Latest Ref Rng & Units 08/09/2016  Glucose 65 - 99 mg/dL 83  BUN 7 - 25 mg/dL 7  Creatinine 0.50 - 0.99 mg/dL 0.71  Sodium 135 - 146 mmol/L 143  Potassium 3.5 - 5.3 mmol/L 3.8  Chloride 98 - 110 mmol/L 105  CO2 20 - 31 mmol/L 26  Calcium 8.6 - 10.4 mg/dL 9.0  Total Protein 6.1 - 8.1 g/dL 7.0  Total Bilirubin 0.2 - 1.2 mg/dL 0.4  Alkaline Phos 33 - 130 U/L 101  AST 10 - 35 U/L 29  ALT 6 - 29 U/L 24   CBC Latest Ref Rng & Units 08/09/2016  WBC 3.8 - 10.8 K/uL 7.2  Hemoglobin 11.7 - 15.5  g/dL 11.3(L)  Hematocrit 35.0 - 45.0 % 36.5  Platelets 140 - 400 K/uL 360    No images are attached to the encounter.  Dg Chest 2 View  Result Date: 07/20/2016 CLINICAL DATA:  Cough for 1 month.  Shortness of Breath EXAM: CHEST  2 VIEW COMPARISON:  November 06, 2013 FINDINGS: There is no edema or consolidation. The heart size and pulmonary vascularity are normal. No adenopathy. No bone lesions. There is colonic interposition between the right hemidiaphragm and liver. IMPRESSION: No edema or consolidation. Electronically Signed   By: Lowella Grip III M.D.   On: 07/20/2016 16:18    Assessment and plan- Patient is a 66 y.o. female who has been referred to [place] for iron deficiency anemia likely secondary to gastric bypass  Iron deficiency anemia- recent bloodwork from 08/09/2016 showed a low ferritin level of 23 and given her history of gastric bypass I think it would be reasonable to proceed with IV iron. I plan to give HER-2 doses of ferriheme 510 mg weekly. I discussed the risks and benefits of IV iron including all but not limited to risk of infusion reactions, headache. Patient understands and agrees to proceed. I will repeat her CBC along with iron studies B12 folate reticulocyte count and haptoglobin and copper levels in about 6-7 weeks from now and see her at that time.  B12 deficiency- continue monthly B12 injections with Dr. Ancil Boozer  Absolute monocytosis - this was seen only on one occasion on 07/20/2016 and CBCs prior to that as well as 1 CBC done subsequently did not confirm persistent monocytosis. At this time I'm inclined to monitor this conservatively and repeat a CBC in about 7 weeks time    Thank you for this kind referral and the opportunity to participate in the care of this patient   Visit Diagnosis 1. Iron deficiency anemia, unspecified iron deficiency anemia type     Dr. Randa Evens, MD, MPH [facility] Pager- [phone] 08/18/2016 3:08  PM

## 2016-08-18 NOTE — Progress Notes (Signed)
Here for new pt eval. Feeling well per pt

## 2016-08-25 ENCOUNTER — Inpatient Hospital Stay: Payer: Medicare HMO | Attending: Oncology

## 2016-08-25 VITALS — BP 110/68 | HR 66 | Temp 97.7°F | Resp 18

## 2016-08-25 DIAGNOSIS — Z79899 Other long term (current) drug therapy: Secondary | ICD-10-CM | POA: Diagnosis not present

## 2016-08-25 DIAGNOSIS — D509 Iron deficiency anemia, unspecified: Secondary | ICD-10-CM | POA: Insufficient documentation

## 2016-08-25 DIAGNOSIS — D508 Other iron deficiency anemias: Secondary | ICD-10-CM

## 2016-08-25 MED ORDER — SODIUM CHLORIDE 0.9 % IV SOLN
Freq: Once | INTRAVENOUS | Status: AC
  Administered 2016-08-25: 14:00:00 via INTRAVENOUS
  Filled 2016-08-25: qty 1000

## 2016-08-25 MED ORDER — SODIUM CHLORIDE 0.9 % IV SOLN
510.0000 mg | Freq: Once | INTRAVENOUS | Status: AC
  Administered 2016-08-25: 510 mg via INTRAVENOUS
  Filled 2016-08-25: qty 17

## 2016-08-26 ENCOUNTER — Ambulatory Visit: Payer: Medicare HMO

## 2016-09-02 ENCOUNTER — Ambulatory Visit (INDEPENDENT_AMBULATORY_CARE_PROVIDER_SITE_OTHER): Payer: Medicare HMO

## 2016-09-02 DIAGNOSIS — E538 Deficiency of other specified B group vitamins: Secondary | ICD-10-CM | POA: Diagnosis not present

## 2016-09-02 MED ORDER — CYANOCOBALAMIN 1000 MCG/ML IJ SOLN
1000.0000 ug | Freq: Once | INTRAMUSCULAR | Status: AC
  Administered 2016-09-02: 1000 ug via INTRAMUSCULAR

## 2016-10-04 ENCOUNTER — Ambulatory Visit: Payer: Medicare HMO

## 2016-10-04 ENCOUNTER — Ambulatory Visit (INDEPENDENT_AMBULATORY_CARE_PROVIDER_SITE_OTHER): Payer: Medicare HMO

## 2016-10-04 DIAGNOSIS — E538 Deficiency of other specified B group vitamins: Secondary | ICD-10-CM | POA: Diagnosis not present

## 2016-10-04 MED ORDER — CYANOCOBALAMIN 1000 MCG/ML IJ SOLN
1000.0000 ug | Freq: Once | INTRAMUSCULAR | Status: AC
  Administered 2016-10-04: 1000 ug via INTRAMUSCULAR

## 2016-10-05 ENCOUNTER — Other Ambulatory Visit: Payer: Self-pay | Admitting: Family Medicine

## 2016-10-05 DIAGNOSIS — I10 Essential (primary) hypertension: Secondary | ICD-10-CM

## 2016-10-06 ENCOUNTER — Inpatient Hospital Stay: Payer: Medicare HMO | Admitting: Oncology

## 2016-10-06 ENCOUNTER — Inpatient Hospital Stay: Payer: Medicare HMO

## 2016-10-06 NOTE — Telephone Encounter (Signed)
Patient requesting refill of Lisinopril to [facility].  

## 2016-11-01 ENCOUNTER — Inpatient Hospital Stay (HOSPITAL_BASED_OUTPATIENT_CLINIC_OR_DEPARTMENT_OTHER): Payer: Medicare HMO | Admitting: Oncology

## 2016-11-01 ENCOUNTER — Inpatient Hospital Stay: Payer: Medicare HMO | Attending: Oncology

## 2016-11-01 ENCOUNTER — Encounter: Payer: Self-pay | Admitting: Oncology

## 2016-11-01 VITALS — BP 125/66 | HR 61 | Temp 96.6°F | Resp 18 | Wt 162.6 lb

## 2016-11-01 DIAGNOSIS — E7800 Pure hypercholesterolemia, unspecified: Secondary | ICD-10-CM | POA: Diagnosis not present

## 2016-11-01 DIAGNOSIS — D509 Iron deficiency anemia, unspecified: Secondary | ICD-10-CM

## 2016-11-01 DIAGNOSIS — E559 Vitamin D deficiency, unspecified: Secondary | ICD-10-CM | POA: Insufficient documentation

## 2016-11-01 DIAGNOSIS — M1990 Unspecified osteoarthritis, unspecified site: Secondary | ICD-10-CM | POA: Insufficient documentation

## 2016-11-01 DIAGNOSIS — E538 Deficiency of other specified B group vitamins: Secondary | ICD-10-CM | POA: Insufficient documentation

## 2016-11-01 DIAGNOSIS — F419 Anxiety disorder, unspecified: Secondary | ICD-10-CM | POA: Insufficient documentation

## 2016-11-01 DIAGNOSIS — E669 Obesity, unspecified: Secondary | ICD-10-CM | POA: Insufficient documentation

## 2016-11-01 DIAGNOSIS — Z9884 Bariatric surgery status: Secondary | ICD-10-CM | POA: Insufficient documentation

## 2016-11-01 DIAGNOSIS — F329 Major depressive disorder, single episode, unspecified: Secondary | ICD-10-CM

## 2016-11-01 DIAGNOSIS — I10 Essential (primary) hypertension: Secondary | ICD-10-CM | POA: Diagnosis not present

## 2016-11-01 DIAGNOSIS — E2839 Other primary ovarian failure: Secondary | ICD-10-CM | POA: Insufficient documentation

## 2016-11-01 DIAGNOSIS — Z79899 Other long term (current) drug therapy: Secondary | ICD-10-CM | POA: Insufficient documentation

## 2016-11-01 DIAGNOSIS — D508 Other iron deficiency anemias: Secondary | ICD-10-CM

## 2016-11-01 LAB — CBC WITH DIFFERENTIAL/PLATELET
Basophils Absolute: 0 10*3/uL (ref 0–0.1)
Basophils Relative: 1 %
EOS ABS: 0.1 10*3/uL (ref 0–0.7)
Eosinophils Relative: 2 %
HEMATOCRIT: 34.8 % — AB (ref 35.0–47.0)
HEMOGLOBIN: 12 g/dL (ref 12.0–16.0)
LYMPHS ABS: 2.4 10*3/uL (ref 1.0–3.6)
LYMPHS PCT: 40 %
MCH: 30.1 pg (ref 26.0–34.0)
MCHC: 34.4 g/dL (ref 32.0–36.0)
MCV: 87.5 fL (ref 80.0–100.0)
MONOS PCT: 8 %
Monocytes Absolute: 0.5 10*3/uL (ref 0.2–0.9)
NEUTROS PCT: 49 %
Neutro Abs: 3 10*3/uL (ref 1.4–6.5)
Platelets: 201 10*3/uL (ref 150–440)
RBC: 3.98 MIL/uL (ref 3.80–5.20)
RDW: 15.9 % — ABNORMAL HIGH (ref 11.5–14.5)
WBC: 5.9 10*3/uL (ref 3.6–11.0)

## 2016-11-01 LAB — FERRITIN: FERRITIN: 244 ng/mL (ref 11–307)

## 2016-11-01 LAB — FOLATE: Folate: 13.5 ng/mL (ref >5.9)

## 2016-11-01 LAB — VITAMIN B12: VITAMIN B 12: 486 pg/mL (ref 180–914)

## 2016-11-01 LAB — IRON AND TIBC
Iron: 135 ug/dL (ref 28–170)
Saturation Ratios: 42 % — ABNORMAL HIGH (ref 10.4–31.8)
TIBC: 323 ug/dL (ref 250–450)
UIBC: 188 ug/dL

## 2016-11-01 LAB — RETICULOCYTES
RBC.: 4.02 MIL/uL (ref 3.80–5.20)
RETIC CT PCT: 0.4 % (ref 0.4–3.1)
Retic Count, Absolute: 16.1 10*3/uL — ABNORMAL LOW (ref 19.0–183.0)

## 2016-11-01 NOTE — Progress Notes (Signed)
Hematology/Oncology Consult note [facility]  [phone] [phone]  Patient Care Team: Steele Sizer, MD as PCP - General (Family Medicine)   Name of the patient: Alexandria Newman  [id number]  01-20-1951   Date of visit: 11/01/16  Diagnosis- iron deficiency anemia likely secondary to gastric bypass  Chief complaint/ Reason for visit- routine f/u  Heme/Onc history: patient is a 66 year old female with a history of gastric bypass about 10 years ago. She has been taking oral iron and B12 intermittently. She was evaluated by Dr. Vicente Males for iron deficiency anemia and underwent EGD and colonoscopy recently which were normal. She is yet to have capsule endoscopy. She did have further workup from his side for iron deficiency anemia including Ua, parietal cell ab,IF antibody ,TSH,celiac serology, MMA,Homocysteine which were normal.  EGD,colonoscopy were normal . Her recent hemoglobin from your 1/7/ 2018 showed white count of 7.2, H&H of 11.3/36.5 with a platelet count of 360. Her hemoglobin has been ranging between 9-10 prior to that. In December 2017 her ferritin was 11 and iron studies showed a low iron saturation of 10%. TIBC at that time was normal at 418. Repeat iron studies from 08/09/2016 showed ferritin of 23, iron saturation was low normal at 14%. Patient reports feeling well and denies any fatigue or other symptoms. Patient was also found to have low borderline levels of B12 and is currently getting monthly B12 through Dr. Ancil Boozer. On one occasion patient was noted to have monocytosis on her CBC with an absolute monocyte count of 1012  Patient received 2 doses of feraheme in April 2018   Interval history- doing well. Denies any complaints  ECOG PS- 0 Pain scale- 0   Review of systems- Review of Systems  Constitutional: Negative for chills, fever, malaise/fatigue and weight loss.  HENT: Negative for congestion, ear discharge and  nosebleeds.   Eyes: Negative for blurred vision.  Respiratory: Negative for cough, hemoptysis, sputum production, shortness of breath and wheezing.   Cardiovascular: Negative for chest pain, palpitations, orthopnea and claudication.  Gastrointestinal: Negative for abdominal pain, blood in stool, constipation, diarrhea, heartburn, melena, nausea and vomiting.  Genitourinary: Negative for dysuria, flank pain, frequency, hematuria and urgency.  Musculoskeletal: Negative for back pain, joint pain and myalgias.  Skin: Negative for rash.  Neurological: Negative for dizziness, tingling, focal weakness, seizures, weakness and headaches.  Endo/Heme/Allergies: Does not bruise/bleed easily.  Psychiatric/Behavioral: Negative for depression and suicidal ideas. The patient does not have insomnia.       No Known Allergies   Past Medical History:  Diagnosis Date  . Anemia   . Anxiety   . B12 deficiency   . Chronic insomnia   . Degeneration of cervical intervertebral disc   . Depression   . Dumping syndrome 2016   per pt had "part of intestines removed"  . Dysfunction of both eustachian tubes   . Hypercholesteremia   . Hypertension   . Metabolic syndrome   . Obesity   . Osteoarthritis of right hand   . Ovarian failure   . TMJ (dislocation of temporomandibular joint)   . Vitamin D deficiency      Past Surgical History:  Procedure Laterality Date  . ABDOMINAL HYSTERECTOMY    . APPENDECTOMY    . COLONOSCOPY WITH PROPOFOL N/A 03/21/2016   Procedure: COLONOSCOPY WITH PROPOFOL;  Surgeon: Jonathon Bellows, MD;  Location: [facility] ENDOSCOPY;  Service: Endoscopy;  Laterality: N/A;  . ESOPHAGOGASTRODUODENOSCOPY (EGD) WITH PROPOFOL N/A 03/21/2016   Procedure:  ESOPHAGOGASTRODUODENOSCOPY (EGD) WITH PROPOFOL;  Surgeon: Jonathon Bellows, MD;  Location: [facility] ENDOSCOPY;  Service: Endoscopy;  Laterality: N/A;  . GASTRIC BYPASS  2009  . LAPAROSCOPY N/A 01/05/2014   Procedure: LAPAROSCOPY DIAGNOSTIC CONVERTED TO   LAPAROTOMY ILECTOMY;  Surgeon: Erroll Luna, MD;  Location: [place];  Service: General;  Laterality: N/A;    Social History   Social History  . Marital status: Married    Spouse name: N/A  . Number of children: N/A  . Years of education: N/A   Occupational History  . Not on file.   Social History Main Topics  . Smoking status: Never Smoker  . Smokeless tobacco: Never Used  . Alcohol use 0.0 oz/week     Comment: occasionally drink  . Drug use: No  . Sexual activity: No   Other Topics Concern  . Not on file   Social History Narrative  . No narrative on file    Family History  Problem Relation Age of Onset  . Heart disease Mother   . CAD Mother   . Kidney disease Mother   . Heart attack Father   . Obesity Sister   . Heart attack Brother        after surgery     Current Outpatient Prescriptions:  .  acetaminophen (TYLENOL) 325 MG tablet, Take 2 tablets (650 mg total) by mouth every 6 (six) hours as needed for mild pain (or Temp > 100)., Disp: , Rfl:  .  ALPRAZolam (XANAX) 0.5 MG tablet, Take 0.5-1 tablets (0.25-0.5 mg total) by mouth daily as needed for anxiety (Must last patient for 3 months)., Disp: 30 tablet, Rfl: 0 .  atenolol (TENORMIN) 25 MG tablet, Take 1 tablet (25 mg total) by mouth daily., Disp: 90 tablet, Rfl: 1 .  atorvastatin (LIPITOR) 40 MG tablet, TAKE 1 TABLET EVERY EVENING., Disp: 90 tablet, Rfl: 1 .  buPROPion (WELLBUTRIN XL) 150 MG 24 hr tablet, Take 1 tablet (150 mg total) by mouth daily., Disp: 90 tablet, Rfl: 1 .  Cholecalciferol (VITAMIN D) 2000 units CAPS, Take 1 capsule (2,000 Units total) by mouth daily. (Patient not taking: Reported on 08/18/2016), Disp: 30 capsule, Rfl: 0 .  citalopram (CELEXA) 20 MG tablet, TAKE 1 TABLET EVERY DAY, Disp: 90 tablet, Rfl: 1 .  lisinopril (PRINIVIL,ZESTRIL) 20 MG tablet, TAKE 1 TABLET EVERY DAY, Disp: 90 tablet, Rfl: 0 .  loratadine (CLARITIN) 10 MG tablet, Take 1 tablet (10 mg total) by mouth daily., Disp: 30  tablet, Rfl: 0 .  temazepam (RESTORIL) 15 MG capsule, Take 1 capsule (15 mg total) by mouth at bedtime as needed for sleep., Disp: 90 capsule, Rfl: 1 .  vitamin B-12 (CYANOCOBALAMIN) 1000 MCG tablet, Take 1 tablet (1,000 mcg total) by mouth daily., Disp: 90 tablet, Rfl: 1  Physical exam:  Vitals:   11/01/16 1031  BP: 125/66  Pulse: 61  Resp: 18  Temp: (!) 96.6 F (35.9 C)  TempSrc: Tympanic  Weight: 162 lb 9.6 oz (73.8 kg)   Physical Exam  Constitutional: She is oriented to person, place, and time and well-developed, well-nourished, and in no distress.  HENT:  Head: Normocephalic and atraumatic.  Eyes: EOM are normal. Pupils are equal, round, and reactive to light.  Neck: Normal range of motion.  Cardiovascular: Normal rate, regular rhythm and normal heart sounds.   Pulmonary/Chest: Effort normal and breath sounds normal.  Abdominal: Soft. Bowel sounds are normal.  Neurological: She is alert and oriented to person, place, and time.  Skin:  Skin is warm and dry.     CMP Latest Ref Rng & Units 08/09/2016  Glucose 65 - 99 mg/dL 83  BUN 7 - 25 mg/dL 7  Creatinine 0.50 - 0.99 mg/dL 0.71  Sodium 135 - 146 mmol/L 143  Potassium 3.5 - 5.3 mmol/L 3.8  Chloride 98 - 110 mmol/L 105  CO2 20 - 31 mmol/L 26  Calcium 8.6 - 10.4 mg/dL 9.0  Total Protein 6.1 - 8.1 g/dL 7.0  Total Bilirubin 0.2 - 1.2 mg/dL 0.4  Alkaline Phos 33 - 130 U/L 101  AST 10 - 35 U/L 29  ALT 6 - 29 U/L 24   CBC Latest Ref Rng & Units 11/01/2016  WBC 3.6 - 11.0 K/uL 5.9  Hemoglobin 12.0 - 16.0 g/dL 12.0  Hematocrit 35.0 - 47.0 % 34.8(L)  Platelets 150 - 440 K/uL 201    Assessment and plan- Patient is a 66 y.o. female with iron deficiency anemia likely secondary to gastric bypass  H/h significantly improved after 2 doses of IV iron. No need for feraheme today. Iron studies, b12 and copper pending. I will repeat cbc and iron studies in 3 months and see her back in 6 months with cbc ferritin and iron studies. She  will get monthly b12 shots with DR. Ancil Boozer   Visit Diagnosis 1. Other iron deficiency anemia   2. H/O gastric bypass      Dr. Randa Evens, MD, MPH [facility] Pager- [phone] 11/01/2016 11:21 AM

## 2016-11-01 NOTE — Progress Notes (Signed)
Here for follow up

## 2016-11-02 LAB — HAPTOGLOBIN: Haptoglobin: 107 mg/dL (ref 34–200)

## 2016-11-03 LAB — COPPER, SERUM: Copper: 92 ug/dL (ref 72–166)

## 2016-11-04 ENCOUNTER — Ambulatory Visit (INDEPENDENT_AMBULATORY_CARE_PROVIDER_SITE_OTHER): Payer: Medicare HMO

## 2016-11-04 DIAGNOSIS — D518 Other vitamin B12 deficiency anemias: Secondary | ICD-10-CM

## 2016-11-04 MED ORDER — CYANOCOBALAMIN 1000 MCG/ML IJ SOLN
1000.0000 ug | Freq: Once | INTRAMUSCULAR | Status: AC
  Administered 2016-11-04: 1000 ug via INTRAMUSCULAR

## 2016-11-05 ENCOUNTER — Emergency Department (HOSPITAL_COMMUNITY)
Admission: EM | Admit: 2016-11-05 | Discharge: 2016-11-05 | Disposition: A | Discharge status: Home / Self Care | Payer: Medicare HMO | Attending: Emergency Medicine | Admitting: Emergency Medicine

## 2016-11-05 ENCOUNTER — Encounter (HOSPITAL_COMMUNITY): Payer: Self-pay | Admitting: *Deleted

## 2016-11-05 DIAGNOSIS — N200 Calculus of kidney: Secondary | ICD-10-CM | POA: Diagnosis not present

## 2016-11-05 DIAGNOSIS — I10 Essential (primary) hypertension: Secondary | ICD-10-CM | POA: Diagnosis not present

## 2016-11-05 DIAGNOSIS — Z79899 Other long term (current) drug therapy: Secondary | ICD-10-CM | POA: Diagnosis not present

## 2016-11-05 DIAGNOSIS — R1031 Right lower quadrant pain: Secondary | ICD-10-CM | POA: Diagnosis present

## 2016-11-05 LAB — URINALYSIS, ROUTINE W REFLEX MICROSCOPIC
BILIRUBIN URINE: NEGATIVE
Bacteria, UA: NONE SEEN
Glucose, UA: NEGATIVE mg/dL
KETONES UR: NEGATIVE mg/dL
Leukocytes, UA: NEGATIVE
Nitrite: NEGATIVE
PROTEIN: NEGATIVE mg/dL
SPECIFIC GRAVITY, URINE: 1.016 (ref 1.005–1.030)
pH: 5 (ref 5.0–8.0)

## 2016-11-05 MED ORDER — HYDROCODONE-ACETAMINOPHEN 5-325 MG PO TABS: 1.0000 | ORAL_TABLET | ORAL | 0 refills | Status: DC | PRN

## 2016-11-05 MED ORDER — ONDANSETRON 4 MG PO TBDP: 4.0000 mg | ORAL_TABLET | ORAL | 0 refills | Status: DC | PRN

## 2016-11-05 NOTE — ED Notes (Signed)
Urine strainer given to the pt at discharged.

## 2016-11-05 NOTE — ED Triage Notes (Signed)
Pt present with rt flank pain , has had kidney stones before, pt unable to void. Event since 0630

## 2016-11-05 NOTE — ED Provider Notes (Signed)
[facility] DEPT Provider Note   CSN: [id number] Arrival date & time: 11/05/16  1056     History   Chief Complaint Chief Complaint  Patient presents with  . Flank Pain    Rt    HPI Alexandria Newman is a 66 y.o. female.  HPI Patient awakened with right flank pain today. She reports it came on pretty quickly and started to get worse fairly quickly. There was no comfortable position. She reports also felt like she couldn't urinate. She reports she has had a kidney stone before she suspected this may be another one. No other associated symptoms. She reports when she got to the emergency department and just as she was being brought back to the room the pain suddenly stopped. She was she was unable to go the bathroom a make a normal urine specimen. She denies she had a pain burning or urgency. She is now pain-free. Past Medical History:  Diagnosis Date  . Anemia   . Anxiety   . B12 deficiency   . Chronic insomnia   . Degeneration of cervical intervertebral disc   . Depression   . Dumping syndrome 2016   per pt had "part of intestines removed"  . Dysfunction of both eustachian tubes   . Hypercholesteremia   . Hypertension   . Metabolic syndrome   . Obesity   . Osteoarthritis of right hand   . Ovarian failure   . TMJ (dislocation of temporomandibular joint)   . Vitamin D deficiency     Patient Active Problem List   Diagnosis Date Noted  . H/O gastric bypass 11/01/2016  . Iron deficiency anemia 02/15/2016  . Senile purpura ([place]) 08/10/2015  . Hyperlipidemia 02/04/2015  . History of small bowel obstruction 02/04/2015  . Hypertension, benign 02/04/2015  . DDD (degenerative disc disease), cervical 02/04/2015  . 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  . Insomnia 02/04/2015  . Osteoarthritis of finger of right hand 02/04/2015  . Metabolic syndrome 46/56/8127  . Depression, major, single episode, mild ([place]) 02/04/2015  . Postsurgical dumping syndrome  02/04/2015  . Vitamin D deficiency 02/04/2015  . Vitamin B12 deficiency 02/04/2015  . Bariatric surgery status     Past Surgical History:  Procedure Laterality Date  . ABDOMINAL HYSTERECTOMY    . APPENDECTOMY    . COLONOSCOPY WITH PROPOFOL N/A 03/21/2016   Procedure: COLONOSCOPY WITH PROPOFOL;  Surgeon: Jonathon Bellows, MD;  Location: [facility] ENDOSCOPY;  Service: Endoscopy;  Laterality: N/A;  . ESOPHAGOGASTRODUODENOSCOPY (EGD) WITH PROPOFOL N/A 03/21/2016   Procedure: ESOPHAGOGASTRODUODENOSCOPY (EGD) WITH PROPOFOL;  Surgeon: Jonathon Bellows, MD;  Location: [facility] ENDOSCOPY;  Service: Endoscopy;  Laterality: N/A;  . GASTRIC BYPASS  2009  . LAPAROSCOPY N/A 01/05/2014   Procedure: LAPAROSCOPY DIAGNOSTIC CONVERTED TO  LAPAROTOMY ILECTOMY;  Surgeon: Erroll Luna, MD;  Location: [facility];  Service: General;  Laterality: N/A;  . PARTIAL COLECTOMY      OB History    Gravida Para Term Preterm AB Living             2   SAB TAB Ectopic Multiple Live Births                   Home Medications    Prior to Admission medications   Medication Sig Start Date End Date Taking? Authorizing Provider  acetaminophen (TYLENOL) 325 MG tablet Take 2 tablets (650 mg total) by mouth every 6 (six) hours as needed for mild pain (or Temp > 100). 11/07/13  Yes Earnstine Regal, PA-C  atenolol (TENORMIN) 25 MG tablet Take 1 tablet (25 mg total) by mouth daily. 07/05/16  Yes Sowles, Drue Stager, MD  atorvastatin (LIPITOR) 40 MG tablet TAKE 1 TABLET EVERY EVENING. 08/08/16  Yes Sowles, Drue Stager, MD  buPROPion (WELLBUTRIN XL) 150 MG 24 hr tablet Take 1 tablet (150 mg total) by mouth daily. 08/09/16  Yes Sowles, Drue Stager, MD  citalopram (CELEXA) 20 MG tablet TAKE 1 TABLET EVERY DAY 08/08/16  Yes Sowles, Drue Stager, MD  ibuprofen (ADVIL,MOTRIN) 200 MG tablet Take 200 mg by mouth every 6 (six) hours as needed.   Yes [provider]  lisinopril (PRINIVIL,ZESTRIL) 20 MG tablet TAKE 1 TABLET EVERY DAY 10/06/16  Yes Sowles, Drue Stager, MD    temazepam (RESTORIL) 15 MG capsule Take 1 capsule (15 mg total) by mouth at bedtime as needed for sleep. 08/09/16  Yes Sowles, Drue Stager, MD  HYDROcodone-acetaminophen (NORCO/VICODIN) 5-325 MG tablet Take 1-2 tablets by mouth every 4 (four) hours as needed for moderate pain or severe pain. 11/05/16   Charlesetta Shanks, MD  ondansetron (ZOFRAN ODT) 4 MG disintegrating tablet Take 1 tablet (4 mg total) by mouth every 4 (four) hours as needed for nausea or vomiting. 11/05/16   Charlesetta Shanks, MD    Family History Family History  Problem Relation Age of Onset  . Heart disease Mother   . CAD Mother   . Kidney disease Mother   . Heart attack Father   . Obesity Sister   . Heart attack Brother        after surgery    Social History Social History  Substance Use Topics  . Smoking status: Never Smoker  . Smokeless tobacco: Never Used  . Alcohol use 0.0 oz/week     Comment: occasionally drink     Allergies   Patient has no known allergies.   Review of Systems Review of Systems 10 Systems reviewed and are negative for acute change except as noted in the HPI.   Physical Exam Updated Vital Signs BP (!) 171/77 (BP Location: Left Arm)   Pulse (!) 57   Temp 97.9 F (36.6 C) (Oral)   Resp 16   Ht 5\' 5"  (1.651 m)   Wt 74.2 kg (163 lb 9 oz)   SpO2 99%   BMI 27.22 kg/m   Physical Exam  Constitutional: She is oriented to person, place, and time. She appears well-developed and well-nourished. No distress.  HENT:  Head: Normocephalic and atraumatic.  Eyes: Conjunctivae are normal.  Neck: Neck supple.  Cardiovascular: Normal rate and regular rhythm.   No murmur heard. Pulmonary/Chest: Effort normal and breath sounds normal. No respiratory distress.  Abdominal: Soft. She exhibits no distension. There is no tenderness.  No CVA tenderness  Musculoskeletal: She exhibits no edema or tenderness.  Neurological: She is alert and oriented to person, place, and time. No cranial nerve deficit.  She exhibits normal muscle tone. Coordination normal.  Skin: Skin is warm and dry.  Psychiatric: She has a normal mood and affect.  Nursing note and vitals reviewed.    ED Treatments / Results  Labs (all labs ordered are listed, but only abnormal results are displayed) Labs Reviewed  URINALYSIS, ROUTINE W REFLEX MICROSCOPIC - Abnormal; Notable for the following:       Result Value   APPearance HAZY (*)    Hgb urine dipstick LARGE (*)    Squamous Epithelial / LPF 6-30 (*)    All other components within normal limits    EKG  EKG Interpretation None  Radiology No results found.  Procedures Procedures (including critical care time)  Medications Ordered in ED Medications - No data to display   Initial Impression / Assessment and Plan / ED Course  I have reviewed the triage vital signs and the nursing notes.  Pertinent labs & imaging results that were available during my care of the patient were reviewed by me and considered in my medical decision making (see chart for details).      Final Clinical Impressions(s) / ED Diagnoses   Final diagnoses:  Kidney stone  Patient has hematuria without any signs of infection. History is consistent with kidney stone. At this time she is likely passed the stone. She is counseled on signs and symptoms which return. She is prescribed with a prescription for Vicodin and Zofran should she have another episode. Patient is counseled on following up with urology.  New Prescriptions New Prescriptions   HYDROCODONE-ACETAMINOPHEN (NORCO/VICODIN) 5-325 MG TABLET    Take 1-2 tablets by mouth every 4 (four) hours as needed for moderate pain or severe pain.   ONDANSETRON (ZOFRAN ODT) 4 MG DISINTEGRATING TABLET    Take 1 tablet (4 mg total) by mouth every 4 (four) hours as needed for nausea or vomiting.     Charlesetta Shanks, MD 11/05/16 1309

## 2016-11-08 ENCOUNTER — Ambulatory Visit (INDEPENDENT_AMBULATORY_CARE_PROVIDER_SITE_OTHER): Payer: Medicare HMO | Admitting: Family Medicine

## 2016-11-08 ENCOUNTER — Encounter: Payer: Self-pay | Admitting: Family Medicine

## 2016-11-08 VITALS — BP 136/74 | HR 73 | Temp 97.9°F | Resp 16 | Ht 65.0 in | Wt 163.5 lb

## 2016-11-08 DIAGNOSIS — R748 Abnormal levels of other serum enzymes: Secondary | ICD-10-CM | POA: Diagnosis not present

## 2016-11-08 DIAGNOSIS — E559 Vitamin D deficiency, unspecified: Secondary | ICD-10-CM | POA: Diagnosis not present

## 2016-11-08 DIAGNOSIS — E8881 Metabolic syndrome: Secondary | ICD-10-CM

## 2016-11-08 DIAGNOSIS — I10 Essential (primary) hypertension: Secondary | ICD-10-CM

## 2016-11-08 DIAGNOSIS — D692 Other nonthrombocytopenic purpura: Secondary | ICD-10-CM

## 2016-11-08 DIAGNOSIS — E782 Mixed hyperlipidemia: Secondary | ICD-10-CM | POA: Diagnosis not present

## 2016-11-08 DIAGNOSIS — F320 Major depressive disorder, single episode, mild: Secondary | ICD-10-CM

## 2016-11-08 DIAGNOSIS — E441 Mild protein-calorie malnutrition: Secondary | ICD-10-CM | POA: Diagnosis not present

## 2016-11-08 DIAGNOSIS — Z9884 Bariatric surgery status: Secondary | ICD-10-CM | POA: Diagnosis not present

## 2016-11-08 DIAGNOSIS — R739 Hyperglycemia, unspecified: Secondary | ICD-10-CM | POA: Diagnosis not present

## 2016-11-08 DIAGNOSIS — L989 Disorder of the skin and subcutaneous tissue, unspecified: Secondary | ICD-10-CM | POA: Diagnosis not present

## 2016-11-08 DIAGNOSIS — Z87442 Personal history of urinary calculi: Secondary | ICD-10-CM

## 2016-11-08 DIAGNOSIS — E46 Unspecified protein-calorie malnutrition: Secondary | ICD-10-CM | POA: Insufficient documentation

## 2016-11-08 MED ORDER — ASPIRIN 81 MG PO CHEW: 81.0000 mg | CHEWABLE_TABLET | Freq: Every day | ORAL | 0 refills | Status: DC

## 2016-11-08 NOTE — Progress Notes (Signed)
Name: Alexandria Newman [facility]   MRN: [id number]    DOB: Jul 12, 1950   Date:11/08/2016       Progress Note  Subjective  Chief Complaint  Chief Complaint  Patient presents with  . Medication Refill    3 month F/U  . Hypertension    Denies any symptoms  . Hyperlipidemia  . Insomnia    Takes medication as needed, sleeping 11 hours nightly  . Depression    Improving    HPI  HTN: she is taking medication and denies side effects. No chest pain or palpation, dizziness resolved.  Hyperlipidemia: taking Lipitor and denies side effects of medication. No myalgias or chest pain. Advised to start aspirin daily.   Depression Mild  episode: she has been on medication for years. She started taking medication when she had an argument with her sister years ago . She gets along well with her husband. She is taking care of her grandchild 10 months. She is worried about her adult son - recently diagnosed with Schizophrenia, but he is doing better , back on medication, not sure if he is doing drugs now. She is feeling better now, taking medication as prescribed. Continue both medications.   Insomnia: taking Alprazolam prn when very stressed, no longer at night, taking Temazepam, and is doing well.   Metabolic Syndrome: last TJQZ0S 5.6, she denies polyphagia, polydipsia . She always has polyuria. She is doing well  Gastric bypass: Almost 10 years ago, she is not taking Vitamin D but has been taking B12 supplementation and is not feeling as tired, she took iron pills without help, just finished a round of iron infusion, and hgb is back to normal   Malnutrition: she is feeling better since iron infusion, last albumin was down to 3.0, we will recheck it now  Dumping syndrome: since bowel obstruction and partial resection in 2015, 2taking Imodium prn, we tried Questran but she could not tolerate the taste. She was seen by Dr. Vicente Males and is taking a fiber supplement and it has helped.   Senile  Purpura: she still bruises on both arms, gave her reassurance  Patient Active Problem List   Diagnosis Date Noted  . Protein calorie malnutrition ([place]) 11/08/2016  . H/O gastric bypass 11/01/2016  . Iron deficiency anemia 02/15/2016  . Senile purpura ([place]) 08/10/2015  . Hyperlipidemia 02/04/2015  . History of small bowel obstruction 02/04/2015  . Hypertension, benign 02/04/2015  . DDD (degenerative disc disease), cervical 02/04/2015  . 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  . Insomnia 02/04/2015  . Osteoarthritis of finger of right hand 02/04/2015  . Metabolic syndrome 92/33/0076  . Depression, major, single episode, mild ([place]) 02/04/2015  . 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 Vitamin D deficiency 02/04/2015  . Vitamin B12 deficiency 02/04/2015  . Bariatric surgery status     Past Surgical History:  Procedure Laterality Date  . ABDOMINAL HYSTERECTOMY    . APPENDECTOMY    . COLONOSCOPY WITH PROPOFOL N/A 03/21/2016   Procedure: COLONOSCOPY WITH PROPOFOL;  Surgeon: Jonathon Bellows, MD;  Location: [facility] ENDOSCOPY;  Service: Endoscopy;  Laterality: N/A;  . ESOPHAGOGASTRODUODENOSCOPY (EGD) WITH PROPOFOL N/A 03/21/2016   Procedure: ESOPHAGOGASTRODUODENOSCOPY (EGD) WITH PROPOFOL;  Surgeon: Jonathon Bellows, MD;  Location: [facility] ENDOSCOPY;  Service: Endoscopy;  Laterality: N/A;  . GASTRIC BYPASS  2009  . LAPAROSCOPY N/A 01/05/2014   Procedure: LAPAROSCOPY DIAGNOSTIC CONVERTED TO  LAPAROTOMY ILECTOMY;  Surgeon: Erroll Luna, MD;  Location: [facility];  Service: General;  Laterality: N/A;  . PARTIAL COLECTOMY  Family History  Problem Relation Age of Onset  . Heart disease Mother   . CAD Mother   . Kidney disease Mother   . Heart attack Father   . Obesity Sister   . Heart attack Brother        after surgery    Social History   Social History  . Marital status: Married    Spouse name: N/A  . Number of children: N/A  . Years of education: N/A   Occupational History   . Not on file.   Social History Main Topics  . Smoking status: Never Smoker  . Smokeless tobacco: Never Used  . Alcohol use 0.0 oz/week     Comment: occasionally drink  . Drug use: No  . Sexual activity: No   Other Topics Concern  . Not on file   Social History Narrative  . No narrative on file     Current Outpatient Prescriptions:  .  acetaminophen (TYLENOL) 325 MG tablet, Take 2 tablets (650 mg total) by mouth every 6 (six) hours as needed for mild pain (or Temp > 100)., Disp: , Rfl:  .  atenolol (TENORMIN) 25 MG tablet, Take 1 tablet (25 mg total) by mouth daily., Disp: 90 tablet, Rfl: 1 .  atorvastatin (LIPITOR) 40 MG tablet, TAKE 1 TABLET EVERY EVENING., Disp: 90 tablet, Rfl: 1 .  buPROPion (WELLBUTRIN XL) 150 MG 24 hr tablet, Take 1 tablet (150 mg total) by mouth daily., Disp: 90 tablet, Rfl: 1 .  citalopram (CELEXA) 20 MG tablet, TAKE 1 TABLET EVERY DAY, Disp: 90 tablet, Rfl: 1 .  ibuprofen (ADVIL,MOTRIN) 200 MG tablet, Take 200 mg by mouth every 6 (six) hours as needed., Disp: , Rfl:  .  lisinopril (PRINIVIL,ZESTRIL) 20 MG tablet, TAKE 1 TABLET EVERY DAY, Disp: 90 tablet, Rfl: 0 .  temazepam (RESTORIL) 15 MG capsule, Take 1 capsule (15 mg total) by mouth at bedtime as needed for sleep., Disp: 90 capsule, Rfl: 1  No Known Allergies   ROS  Constitutional: Negative for fever or weight change.  Respiratory: Negative for cough and shortness of breath.   Cardiovascular: Negative for chest pain or palpitations.  Gastrointestinal: Negative for abdominal pain, no bowel changes.  Musculoskeletal: Negative for gait problem or joint swelling.  Skin: Negative for rash.  Neurological: Negative for dizziness or headache.  No other specific complaints in a complete review of systems (except as listed in HPI above).  Objective  Vitals:   11/08/16 1328  BP: 136/74  Pulse: 73  Resp: 16  Temp: 97.9 F (36.6 C)  TempSrc: Oral  SpO2: 96%  Weight: 163 lb 8 oz (74.2 kg)   Height: 5\' 5"  (1.651 m)    Body mass index is 27.21 kg/m.  Physical Exam  Constitutional: Patient appears well-developed and well-nourished. Overweight.  No distress.  HEENT: head atraumatic, normocephalic, pupils equal and reactive to light,  neck supple, throat within normal limits Cardiovascular: Normal rate, regular rhythm and normal heart sounds.  No murmur heard. No BLE edema. Pulmonary/Chest: Effort normal and breath sounds normal. No respiratory distress. Abdominal: Soft.  There is no tenderness. Negative CVA tenderness Psychiatric: Patient has a normal mood and affect. behavior is normal. Judgment and thought content normal.  Recent Results (from the past 2160 hour(s))  CBC with Differential/Platelet     Status: Abnormal   Collection Time: 11/01/16 10:05 AM  Result Value Ref Range   WBC 5.9 3.6 - 11.0 K/uL   RBC 3.98 3.80 - 5.20  MIL/uL   Hemoglobin 12.0 12.0 - 16.0 g/dL   HCT 34.8 (L) 35.0 - 47.0 %   MCV 87.5 80.0 - 100.0 fL   MCH 30.1 26.0 - 34.0 pg   MCHC 34.4 32.0 - 36.0 g/dL   RDW 15.9 (H) 11.5 - 14.5 %   Platelets 201 150 - 440 K/uL   Neutrophils Relative % 49 %   Neutro Abs 3.0 1.4 - 6.5 K/uL   Lymphocytes Relative 40 %   Lymphs Abs 2.4 1.0 - 3.6 K/uL   Monocytes Relative 8 %   Monocytes Absolute 0.5 0.2 - 0.9 K/uL   Eosinophils Relative 2 %   Eosinophils Absolute 0.1 0 - 0.7 K/uL   Basophils Relative 1 %   Basophils Absolute 0.0 0 - 0.1 K/uL  Ferritin     Status: None   Collection Time: 11/01/16 10:05 AM  Result Value Ref Range   Ferritin 244 11 - 307 ng/mL  Folate     Status: None   Collection Time: 11/01/16 10:05 AM  Result Value Ref Range   Folate 13.5 >5.9 ng/mL  Iron and TIBC     Status: Abnormal   Collection Time: 11/01/16 10:05 AM  Result Value Ref Range   Iron 135 28 - 170 ug/dL   TIBC 323 250 - 450 ug/dL   Saturation Ratios 42 (H) 10.4 - 31.8 %   UIBC 188 ug/dL  Vitamin B12     Status: None   Collection Time: 11/01/16 10:05 AM  Result  Value Ref Range   Vitamin B-12 486 180 - 914 pg/mL    Comment: (NOTE) This assay is not validated for testing neonatal or myeloproliferative syndrome specimens for Vitamin B12 levels. Performed at [facility], [place] [address] [place], [place] [address]   Haptoglobin     Status: None   Collection Time: 11/01/16 10:05 AM  Result Value Ref Range   Haptoglobin 107 34 - 200 mg/dL    Comment: (NOTE) Performed At: [facility] [place], [place] [id number] Lindon Romp MD [id number]   Reticulocytes     Status: Abnormal   Collection Time: 11/01/16 10:05 AM  Result Value Ref Range   Retic Ct Pct 0.4 0.4 - 3.1 %   RBC. 4.02 3.80 - 5.20 MIL/uL   Retic Count, Absolute 16.1 (L) 19.0 - 183.0 K/uL  Copper, serum     Status: None   Collection Time: 11/01/16 10:05 AM  Result Value Ref Range   Copper 92 72 - 166 ug/dL    Comment: (NOTE)                                Detection Limit = 5 Performed At: [facility] [address] [place], [place] [id number] Lindon Romp MD [id number]   Urinalysis, Routine w reflex microscopic- may I&O cath if menses     Status: Abnormal   Collection Time: 11/05/16 12:25 PM  Result Value Ref Range   Color, Urine YELLOW YELLOW   APPearance HAZY (A) CLEAR   Specific Gravity, Urine 1.016 1.005 - 1.030   pH 5.0 5.0 - 8.0   Glucose, UA NEGATIVE NEGATIVE mg/dL   Hgb urine dipstick LARGE (A) NEGATIVE   Bilirubin Urine NEGATIVE NEGATIVE   Ketones, ur NEGATIVE NEGATIVE mg/dL   Protein, ur NEGATIVE NEGATIVE mg/dL   Nitrite NEGATIVE NEGATIVE   Leukocytes, UA NEGATIVE NEGATIVE   RBC /  HPF TOO NUMEROUS TO COUNT 0 - 5 RBC/hpf   WBC, UA 0-5 0 - 5 WBC/hpf   Bacteria, UA NONE SEEN NONE SEEN   Squamous Epithelial / LPF 6-30 (A) NONE SEEN   Mucous PRESENT    Hyaline Casts, UA PRESENT      PHQ2/9: Depression screen [facility] 2/9 11/08/2016 08/09/2016 02/09/2016 08/10/2015 02/04/2015  Decreased Interest 0 0 0 0 0  Down,  Depressed, Hopeless 0 0 0 0 0  PHQ - 2 Score 0 0 0 0 0     Fall Risk: Fall Risk  11/08/2016 08/09/2016 02/09/2016 08/10/2015 02/04/2015  Falls in the past year? No No No No No     Functional Status Survey: Is the patient deaf or have difficulty hearing?: No Does the patient have difficulty seeing, even when wearing glasses/contacts?: No Does the patient have difficulty concentrating, remembering, or making decisions?: No Does the patient have difficulty walking or climbing stairs?: No Does the patient have difficulty dressing or bathing?: No Does the patient have difficulty doing errands alone such as visiting a doctor's office or shopping?: No    Assessment & Plan  1. Hypertension, benign  - COMPLETE METABOLIC PANEL WITH GFR  2. Metabolic syndrome  Recheck labs  3. Mixed hyperlipidemia  - Lipid panel  4. Hyperglycemia  - Hemoglobin A1c - Insulin, fasting  5. H/O gastric bypass  Recheck labs, still has dumping syndrome secondary to partial small bowel resection   6. Depression, major, single episode, mild ([place])  Doing well   7. Elevated liver enzymes  - COMPLETE METABOLIC PANEL WITH GFR  8. Senile purpura ([facility])  stable  9. Mild protein-calorie malnutrition ([facility])  - COMPLETE METABOLIC PANEL WITH GFR  10. Vitamin D deficiency  - VITAMIN D 25 Hydroxy (Vit-D Deficiency, Fractures)  11. History of kidney stones  Passed over the weekend, went to [facility], but by the time she got checked in the passed the stone and was sent home, she never filled medications, feeling well, passed two stones in the past, while [place] about 20 years ago.    12. Skin lesion  Likely inflamed SK, however two shades of color, advised to return for shave biopsy

## 2016-11-10 ENCOUNTER — Encounter: Payer: Self-pay | Admitting: Family Medicine

## 2016-11-10 ENCOUNTER — Other Ambulatory Visit: Payer: Self-pay | Admitting: Family Medicine

## 2016-11-10 ENCOUNTER — Ambulatory Visit (INDEPENDENT_AMBULATORY_CARE_PROVIDER_SITE_OTHER): Payer: Medicare HMO | Admitting: Family Medicine

## 2016-11-10 VITALS — BP 118/68 | HR 74 | Temp 97.9°F | Resp 18 | Wt 165.3 lb

## 2016-11-10 DIAGNOSIS — D224 Melanocytic nevi of scalp and neck: Secondary | ICD-10-CM | POA: Diagnosis not present

## 2016-11-10 DIAGNOSIS — L989 Disorder of the skin and subcutaneous tissue, unspecified: Secondary | ICD-10-CM | POA: Diagnosis not present

## 2016-11-10 NOTE — Addendum Note (Signed)
Addended by: Lolita Rieger D on: 11/10/2016 12:11 PM   Modules accepted: Orders

## 2016-11-10 NOTE — Progress Notes (Signed)
Name: Alexandria Newman [facility]   MRN: [id number]    DOB: 1950-09-19   Date:11/10/2016       Progress Note  Subjective  Chief Complaint  Chief Complaint  Patient presents with  . Procedure    skin biopsy    HPI  Skin lesion growing and at times itchy on left side of neck , returned today for biopsy   Patient Active Problem List   Diagnosis Date Noted  . Protein calorie malnutrition ([place]) 11/08/2016  . H/O gastric bypass 11/01/2016  . Iron deficiency anemia 02/15/2016  . Senile purpura ([place]) 08/10/2015  . Hyperlipidemia 02/04/2015  . History of small bowel obstruction 02/04/2015  . Hypertension, benign 02/04/2015  . DDD (degenerative disc disease), cervical 02/04/2015  . 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  . Insomnia 02/04/2015  . Osteoarthritis of finger of right hand 02/04/2015  . Metabolic syndrome 18/56/3149  . Depression, major, single episode, mild ([place]) 02/04/2015  . 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 Vitamin D deficiency 02/04/2015  . Vitamin B12 deficiency 02/04/2015  . Bariatric surgery status     Social History  Substance Use Topics  . Smoking status: Never Smoker  . Smokeless tobacco: Never Used  . Alcohol use 0.0 oz/week     Comment: occasionally drink     Current Outpatient Prescriptions:  .  acetaminophen (TYLENOL) 325 MG tablet, Take 2 tablets (650 mg total) by mouth every 6 (six) hours as needed for mild pain (or Temp > 100)., Disp: , Rfl:  .  aspirin (ASPIRIN CHILDRENS) 81 MG chewable tablet, Chew 1 tablet (81 mg total) by mouth daily., Disp: 30 tablet, Rfl: 0 .  atenolol (TENORMIN) 25 MG tablet, Take 1 tablet (25 mg total) by mouth daily., Disp: 90 tablet, Rfl: 1 .  atorvastatin (LIPITOR) 40 MG tablet, TAKE 1 TABLET EVERY EVENING., Disp: 90 tablet, Rfl: 1 .  buPROPion (WELLBUTRIN XL) 150 MG 24 hr tablet, Take 1 tablet (150 mg total) by mouth daily., Disp: 90 tablet, Rfl: 1 .  citalopram (CELEXA) 20 MG tablet, TAKE 1 TABLET  EVERY DAY, Disp: 90 tablet, Rfl: 1 .  ibuprofen (ADVIL,MOTRIN) 200 MG tablet, Take 200 mg by mouth every 6 (six) hours as needed., Disp: , Rfl:  .  lisinopril (PRINIVIL,ZESTRIL) 20 MG tablet, TAKE 1 TABLET EVERY DAY, Disp: 90 tablet, Rfl: 0 .  temazepam (RESTORIL) 15 MG capsule, Take 1 capsule (15 mg total) by mouth at bedtime as needed for sleep., Disp: 90 capsule, Rfl: 1  No Known Allergies  ROS  Not done  Objective  Vitals:   11/10/16 1134  BP: 118/68  Pulse: 74  Resp: 18  Temp: 97.9 F (36.6 C)  SpO2: 96%  Weight: 165 lb 5 oz (75 kg)    Body mass index is 27.51 kg/m.    Physical Exam  15 mm by 7 mm raised lesion, with one side hyperpigmented color and the other half normal flesh color, no redness or signs of inflammation on left side of neck   Recent Results (from the past 2160 hour(s))  CBC with Differential/Platelet     Status: Abnormal   Collection Time: 11/01/16 10:05 AM  Result Value Ref Range   WBC 5.9 3.6 - 11.0 K/uL   RBC 3.98 3.80 - 5.20 MIL/uL   Hemoglobin 12.0 12.0 - 16.0 g/dL   HCT 34.8 (L) 35.0 - 47.0 %   MCV 87.5 80.0 - 100.0 fL   MCH 30.1 26.0 - 34.0 pg   MCHC 34.4 32.0 -  36.0 g/dL   RDW 15.9 (H) 11.5 - 14.5 %   Platelets 201 150 - 440 K/uL   Neutrophils Relative % 49 %   Neutro Abs 3.0 1.4 - 6.5 K/uL   Lymphocytes Relative 40 %   Lymphs Abs 2.4 1.0 - 3.6 K/uL   Monocytes Relative 8 %   Monocytes Absolute 0.5 0.2 - 0.9 K/uL   Eosinophils Relative 2 %   Eosinophils Absolute 0.1 0 - 0.7 K/uL   Basophils Relative 1 %   Basophils Absolute 0.0 0 - 0.1 K/uL  Ferritin     Status: None   Collection Time: 11/01/16 10:05 AM  Result Value Ref Range   Ferritin 244 11 - 307 ng/mL  Folate     Status: None   Collection Time: 11/01/16 10:05 AM  Result Value Ref Range   Folate 13.5 >5.9 ng/mL  Iron and TIBC     Status: Abnormal   Collection Time: 11/01/16 10:05 AM  Result Value Ref Range   Iron 135 28 - 170 ug/dL   TIBC 323 250 - 450 ug/dL    Saturation Ratios 42 (H) 10.4 - 31.8 %   UIBC 188 ug/dL  Vitamin B12     Status: None   Collection Time: 11/01/16 10:05 AM  Result Value Ref Range   Vitamin B-12 486 180 - 914 pg/mL    Comment: (NOTE) This assay is not validated for testing neonatal or myeloproliferative syndrome specimens for Vitamin B12 levels. Performed at [facility], [place] [address], [place], [place] [address]   Haptoglobin     Status: None   Collection Time: 11/01/16 10:05 AM  Result Value Ref Range   Haptoglobin 107 34 - 200 mg/dL    Comment: (NOTE) Performed At: [facility] [place], [place] [id number] Lindon Romp MD AL:[id number]   Reticulocytes     Status: Abnormal   Collection Time: 11/01/16 10:05 AM  Result Value Ref Range   Retic Ct Pct 0.4 0.4 - 3.1 %   RBC. 4.02 3.80 - 5.20 MIL/uL   Retic Count, Absolute 16.1 (L) 19.0 - 183.0 K/uL  Copper, serum     Status: None   Collection Time: 11/01/16 10:05 AM  Result Value Ref Range   Copper 92 72 - 166 ug/dL    Comment: (NOTE)                                Detection Limit = 5 Performed At: [facility] [place], [place] [id number] Lindon Romp MD AS:[id number]   Urinalysis, Routine w reflex microscopic- may I&O cath if menses     Status: Abnormal   Collection Time: 11/05/16 12:25 PM  Result Value Ref Range   Color, Urine YELLOW YELLOW   APPearance HAZY (A) CLEAR   Specific Gravity, Urine 1.016 1.005 - 1.030   pH 5.0 5.0 - 8.0   Glucose, UA NEGATIVE NEGATIVE mg/dL   Hgb urine dipstick LARGE (A) NEGATIVE   Bilirubin Urine NEGATIVE NEGATIVE   Ketones, ur NEGATIVE NEGATIVE mg/dL   Protein, ur NEGATIVE NEGATIVE mg/dL   Nitrite NEGATIVE NEGATIVE   Leukocytes, UA NEGATIVE NEGATIVE   RBC / HPF TOO NUMEROUS TO COUNT 0 - 5 RBC/hpf   WBC, UA 0-5 0 - 5 WBC/hpf   Bacteria, UA NONE SEEN NONE SEEN   Squamous Epithelial / LPF 6-30 (A) NONE SEEN   Mucous PRESENT  Hyaline Casts, UA PRESENT       Assessment & Plan  1. Skin lesion   Consent signed: YES  Procedure: Skin lesion  Removal Location: left neck  Equipment used: derma-blade,  Anesthesia: 1% Lidocaine w/o Epinephrine  Cleaned and prepped: Betadine  After consent signed, are of skin prepped with betadine. Lidocaine w/o epinephrine injected into skin underneath skin mass. After properly numbed sterile equipment used to remove tag.  Specimen sent for pathology analysis. Instructed on proper care to allow for proper healing. F/U for nursing visit if needed. - Dermatology pathology

## 2016-11-11 ENCOUNTER — Ambulatory Visit: Payer: Medicare HMO | Admitting: Family Medicine

## 2016-11-11 LAB — LIPID PANEL
CHOL/HDL RATIO: 1.9 ratio (ref <5.0)
Cholesterol: 95 mg/dL (ref <200)
HDL: 49 mg/dL — AB (ref >50)
LDL Cholesterol: 33 mg/dL (ref <100)
TRIGLYCERIDES: 65 mg/dL (ref <150)
VLDL: 13 mg/dL (ref <30)

## 2016-11-11 LAB — COMPLETE METABOLIC PANEL WITHOUT GFR
ALT: 34 U/L — ABNORMAL HIGH (ref 6–29)
AST: 27 U/L (ref 10–35)
Albumin: 3.4 g/dL — ABNORMAL LOW (ref 3.6–5.1)
Alkaline Phosphatase: 62 U/L (ref 33–130)
BUN: 8 mg/dL (ref 7–25)
CO2: 24 mmol/L (ref 20–31)
Calcium: 8.5 mg/dL — ABNORMAL LOW (ref 8.6–10.4)
Chloride: 112 mmol/L — ABNORMAL HIGH (ref 98–110)
Creat: 0.85 mg/dL (ref 0.50–0.99)
GFR, Est African American: 82 mL/min
GFR, Est Non African American: 71 mL/min
Glucose, Bld: 78 mg/dL (ref 65–99)
Potassium: 3.6 mmol/L (ref 3.5–5.3)
Sodium: 144 mmol/L (ref 135–146)
Total Bilirubin: 0.5 mg/dL (ref 0.2–1.2)
Total Protein: 6 g/dL — ABNORMAL LOW (ref 6.1–8.1)

## 2016-11-11 LAB — VITAMIN D 25 HYDROXY (VIT D DEFICIENCY, FRACTURES): Vit D, 25-Hydroxy: 25 ng/mL — ABNORMAL LOW (ref 30–100)

## 2016-11-11 LAB — INSULIN, FASTING: Insulin fasting, serum: 5.5 u[IU]/mL (ref 2.0–19.6)

## 2016-11-11 LAB — HEMOGLOBIN A1C
Hgb A1c MFr Bld: 5.4 % (ref <5.7)
Mean Plasma Glucose: 108 mg/dL

## 2016-11-16 LAB — PATHOLOGY

## 2016-11-23 ENCOUNTER — Other Ambulatory Visit: Payer: Self-pay | Admitting: Family Medicine

## 2016-11-24 NOTE — Telephone Encounter (Signed)
Patient requesting refill of Atenolol to [facility].

## 2016-12-05 ENCOUNTER — Ambulatory Visit (INDEPENDENT_AMBULATORY_CARE_PROVIDER_SITE_OTHER): Payer: Medicare HMO

## 2016-12-05 DIAGNOSIS — E538 Deficiency of other specified B group vitamins: Secondary | ICD-10-CM | POA: Diagnosis not present

## 2016-12-05 MED ORDER — CYANOCOBALAMIN 1000 MCG/ML IJ SOLN
1000.0000 ug | Freq: Once | INTRAMUSCULAR | Status: AC
  Administered 2016-12-05: 1000 ug via INTRAMUSCULAR

## 2016-12-09 ENCOUNTER — Telehealth: Payer: Self-pay | Admitting: Family Medicine

## 2016-12-09 NOTE — Telephone Encounter (Signed)
Left message regarding need to schedule awv °

## 2017-01-17 ENCOUNTER — Ambulatory Visit: Payer: Medicare HMO | Admitting: Family Medicine

## 2017-01-17 DIAGNOSIS — E538 Deficiency of other specified B group vitamins: Secondary | ICD-10-CM

## 2017-01-17 MED ORDER — CYANOCOBALAMIN 1000 MCG/ML IJ SOLN
1000.0000 ug | INTRAMUSCULAR | Status: DC
  Administered 2017-06-02: 1000 ug via INTRAMUSCULAR

## 2017-01-17 NOTE — Progress Notes (Signed)
Patient is here for B-12 injection. Standing order for 1 year has been teed up for you to sign.

## 2017-01-30 ENCOUNTER — Ambulatory Visit (INDEPENDENT_AMBULATORY_CARE_PROVIDER_SITE_OTHER): Payer: Medicare HMO

## 2017-01-30 ENCOUNTER — Other Ambulatory Visit: Payer: Self-pay | Admitting: Family Medicine

## 2017-01-30 DIAGNOSIS — Z23 Encounter for immunization: Secondary | ICD-10-CM

## 2017-01-30 NOTE — Telephone Encounter (Signed)
Patient requesting refill of Alprazolam to [facility].

## 2017-03-17 ENCOUNTER — Other Ambulatory Visit: Payer: Self-pay | Admitting: Family Medicine

## 2017-03-17 DIAGNOSIS — F320 Major depressive disorder, single episode, mild: Secondary | ICD-10-CM

## 2017-03-27 ENCOUNTER — Ambulatory Visit (INDEPENDENT_AMBULATORY_CARE_PROVIDER_SITE_OTHER): Payer: Medicare HMO

## 2017-03-27 DIAGNOSIS — E538 Deficiency of other specified B group vitamins: Secondary | ICD-10-CM | POA: Diagnosis not present

## 2017-03-27 MED ORDER — CYANOCOBALAMIN 1000 MCG/ML IJ SOLN
1000.0000 ug | INTRAMUSCULAR | Status: AC
  Administered 2017-03-27 – 2017-11-16 (×6): 1000 ug via SUBCUTANEOUS

## 2017-03-27 NOTE — Progress Notes (Signed)
Patient tolerated B-12 injection well. NKDA to medication.

## 2017-04-17 ENCOUNTER — Other Ambulatory Visit: Payer: Self-pay | Admitting: Family Medicine

## 2017-04-28 ENCOUNTER — Other Ambulatory Visit: Payer: Self-pay | Admitting: Family Medicine

## 2017-04-28 DIAGNOSIS — F320 Major depressive disorder, single episode, mild: Secondary | ICD-10-CM

## 2017-04-28 NOTE — Telephone Encounter (Signed)
Refill request for general medication: Bupropion to [facility].  Last office visit: 01/17/2017   Next visit: 06/02/2017

## 2017-05-01 ENCOUNTER — Other Ambulatory Visit: Payer: Self-pay

## 2017-05-01 DIAGNOSIS — D508 Other iron deficiency anemias: Secondary | ICD-10-CM

## 2017-05-04 ENCOUNTER — Inpatient Hospital Stay: Payer: Medicare HMO

## 2017-05-04 ENCOUNTER — Other Ambulatory Visit: Payer: Self-pay

## 2017-05-04 ENCOUNTER — Encounter: Payer: Self-pay | Admitting: Oncology

## 2017-05-04 ENCOUNTER — Inpatient Hospital Stay: Payer: Medicare HMO | Attending: Oncology | Admitting: Oncology

## 2017-05-04 VITALS — BP 181/78 | HR 58 | Temp 97.0°F | Resp 18 | Wt 168.5 lb

## 2017-05-04 DIAGNOSIS — E538 Deficiency of other specified B group vitamins: Secondary | ICD-10-CM | POA: Diagnosis not present

## 2017-05-04 DIAGNOSIS — Z9884 Bariatric surgery status: Secondary | ICD-10-CM | POA: Diagnosis not present

## 2017-05-04 DIAGNOSIS — D508 Other iron deficiency anemias: Secondary | ICD-10-CM

## 2017-05-04 DIAGNOSIS — D509 Iron deficiency anemia, unspecified: Secondary | ICD-10-CM | POA: Diagnosis not present

## 2017-05-04 LAB — FERRITIN: Ferritin: 114 ng/mL (ref 11–307)

## 2017-05-04 LAB — CBC
HCT: 36.7 % (ref 35.0–47.0)
HEMOGLOBIN: 12.3 g/dL (ref 12.0–16.0)
MCH: 30.4 pg (ref 26.0–34.0)
MCHC: 33.5 g/dL (ref 32.0–36.0)
MCV: 90.8 fL (ref 80.0–100.0)
Platelets: 222 10*3/uL (ref 150–440)
RBC: 4.04 MIL/uL (ref 3.80–5.20)
RDW: 13.4 % (ref 11.5–14.5)
WBC: 6.1 10*3/uL (ref 3.6–11.0)

## 2017-05-04 LAB — IRON AND TIBC
IRON: 123 ug/dL (ref 28–170)
SATURATION RATIOS: 38 % — AB (ref 10.4–31.8)
TIBC: 321 ug/dL (ref 250–450)
UIBC: 198 ug/dL

## 2017-05-04 NOTE — Progress Notes (Signed)
Here for follow up " feeling great overall "per pt. Repeat BP @ 1022 170/68  p61 -pt asymptomatic and stated she has pcp appt in 2 weeks.  Dr Janese Banks informed

## 2017-05-04 NOTE — Progress Notes (Signed)
Hematology/Oncology Consult note [facility]  Telephone:([phone] [phone]  Patient Care Team: Steele Sizer, MD as PCP - General (Family Medicine)   Name of the patient: Alexandria Newman  [id number]  11-Jul-1950   Date of visit: 05/04/17  Diagnosis- iron deficiency anemia likely secondary to gastric bypass  Chief complaint/ Reason for visit- routine f/u of anemia  Heme/Onc history: patient is a 67 year old female with a history of gastric bypass about 10 years ago. She has been taking oral iron and B12 intermittently. She was evaluated by Dr. Vicente Males for iron deficiency anemia and underwent EGD and colonoscopy recently which were normal. She is yet to have capsule endoscopy. She did have further workup from his side for iron deficiency anemia including Ua, parietal cell ab,IF antibody ,TSH,celiac serology, MMA,Homocysteine which were normal.  EGD,colonoscopy were normal . Her recent hemoglobin from your 1/7/ 2018 showed white count of 7.2, H&H of 11.3/36.5 with a platelet count of 360. Her hemoglobin has been ranging between 9-10 prior to that. In December 2017 her ferritin was 11 and iron studies showed a low iron saturation of 10%. TIBC at that time was normal at 418. Repeat iron studies from 08/09/2016 showed ferritin of 23, iron saturation was low normal at 14%. Patient reports feeling well and denies any fatigue or other symptoms. Patient was also found to have low borderline levels of B12 and is currently getting monthly B12 through Dr. Ancil Boozer. On one occasion patient was noted to have monocytosis on her CBC with an absolute monocyte count of 1012  Patient received 2 doses of feraheme in April 2018   Interval history- she is doing well. Denies any blood loss in urine or stool  ECOG PS- 0 Pain scale- 0   Review of systems- Review of Systems  Constitutional: Negative for chills, fever, malaise/fatigue and weight loss.  HENT:  Negative for congestion, ear discharge and nosebleeds.   Eyes: Negative for blurred vision.  Respiratory: Negative for cough, hemoptysis, sputum production, shortness of breath and wheezing.   Cardiovascular: Negative for chest pain, palpitations, orthopnea and claudication.  Gastrointestinal: Negative for abdominal pain, blood in stool, constipation, diarrhea, heartburn, melena, nausea and vomiting.  Genitourinary: Negative for dysuria, flank pain, frequency, hematuria and urgency.  Musculoskeletal: Negative for back pain, joint pain and myalgias.  Skin: Negative for rash.  Neurological: Negative for dizziness, tingling, focal weakness, seizures, weakness and headaches.  Endo/Heme/Allergies: Does not bruise/bleed easily.  Psychiatric/Behavioral: Negative for depression and suicidal ideas. The patient does not have insomnia.      No Known Allergies   Past Medical History:  Diagnosis Date  . Anemia   . Anxiety   . B12 deficiency   . Chronic insomnia   . Degeneration of cervical intervertebral disc   . Depression   . Dumping syndrome 2016   per pt had "part of intestines removed"  . Dysfunction of both eustachian tubes   . Hypercholesteremia   . Hypertension   . Metabolic syndrome   . Obesity   . Osteoarthritis of right hand   . Ovarian failure   . TMJ (dislocation of temporomandibular joint)   . Vitamin D deficiency      Past Surgical History:  Procedure Laterality Date  . ABDOMINAL HYSTERECTOMY    . APPENDECTOMY    . COLONOSCOPY WITH PROPOFOL N/A 03/21/2016   Procedure: COLONOSCOPY WITH PROPOFOL;  Surgeon: Jonathon Bellows, MD;  Location: [facility] ENDOSCOPY;  Service: Endoscopy;  Laterality: N/A;  . ESOPHAGOGASTRODUODENOSCOPY (  EGD) WITH PROPOFOL N/A 03/21/2016   Procedure: ESOPHAGOGASTRODUODENOSCOPY (EGD) WITH PROPOFOL;  Surgeon: Jonathon Bellows, MD;  Location: [facility] ENDOSCOPY;  Service: Endoscopy;  Laterality: N/A;  . GASTRIC BYPASS  2009  . LAPAROSCOPY N/A 01/05/2014   Procedure:  LAPAROSCOPY DIAGNOSTIC CONVERTED TO  LAPAROTOMY ILECTOMY;  Surgeon: Erroll Luna, MD;  Location: [facility];  Service: General;  Laterality: N/A;  . PARTIAL COLECTOMY      Social History   Socioeconomic History  . Marital status: Married    Spouse name: Not on file  . Number of children: Not on file  . Years of education: Not on file  . Highest education level: Not on file  Social Needs  . Financial resource strain: Not on file  . Food insecurity - worry: Not on file  . Food insecurity - inability: Not on file  . Transportation needs - medical: Not on file  . Transportation needs - non-medical: Not on file  Occupational History  . Not on file  Tobacco Use  . Smoking status: Never Smoker  . Smokeless tobacco: Never Used  Substance and Sexual Activity  . Alcohol use: Yes    Alcohol/week: 0.0 oz    Comment: occasionally drink  . Drug use: No  . Sexual activity: No  Other Topics Concern  . Not on file  Social History Narrative  . Not on file    Family History  Problem Relation Age of Onset  . Heart disease Mother   . CAD Mother   . Kidney disease Mother   . Heart attack Father   . Obesity Sister   . Heart attack Brother        after surgery     Current Outpatient Medications:  .  acetaminophen (TYLENOL) 325 MG tablet, Take 2 tablets (650 mg total) by mouth every 6 (six) hours as needed for mild pain (or Temp > 100)., Disp: , Rfl:  .  atenolol (TENORMIN) 25 MG tablet, TAKE 1 TABLET EVERY DAY, Disp: 90 tablet, Rfl: 1 .  atorvastatin (LIPITOR) 40 MG tablet, TAKE 1 TABLET EVERY EVENING., Disp: 90 tablet, Rfl: 1 .  buPROPion (WELLBUTRIN XL) 150 MG 24 hr tablet, TAKE 1 TABLET EVERY DAY, Disp: 90 tablet, Rfl: 1 .  citalopram (CELEXA) 20 MG tablet, TAKE 1 TABLET EVERY DAY, Disp: 90 tablet, Rfl: 0 .  lisinopril (PRINIVIL,ZESTRIL) 20 MG tablet, TAKE 1 TABLET EVERY DAY, Disp: 90 tablet, Rfl: 0 .  temazepam (RESTORIL) 15 MG capsule, Take 1 capsule (15 mg total) by mouth at bedtime  as needed for sleep., Disp: 90 capsule, Rfl: 1 .  aspirin (ASPIRIN CHILDRENS) 81 MG chewable tablet, Chew 1 tablet (81 mg total) by mouth daily. (Patient not taking: Reported on 05/04/2017), Disp: 30 tablet, Rfl: 0 .  ibuprofen (ADVIL,MOTRIN) 200 MG tablet, Take 200 mg by mouth every 6 (six) hours as needed., Disp: , Rfl:   Current Facility-Administered Medications:  .  cyanocobalamin ((VITAMIN B-12)) injection 1,000 mcg, 1,000 mcg, Intramuscular, Q30 days, Sowles, Drue Stager, MD .  cyanocobalamin ((VITAMIN B-12)) injection 1,000 mcg, 1,000 mcg, Subcutaneous, Q30 days, Steele Sizer, MD, 1,000 mcg at 03/27/17 1005  Physical exam:  Vitals:   05/04/17 0959  BP: (!) 181/78  Pulse: (!) 58  Resp: 18  Temp: (!) 97 F (36.1 C)  TempSrc: Tympanic  Weight: 168 lb 8 oz (76.4 kg)   Physical Exam  Constitutional: She is oriented to person, place, and time and well-developed, well-nourished, and in no distress.  HENT:  Head:  Normocephalic and atraumatic.  Eyes: EOM are normal. Pupils are equal, round, and reactive to light.  Neck: Normal range of motion.  Cardiovascular: Normal rate, regular rhythm and normal heart sounds.  Pulmonary/Chest: Effort normal and breath sounds normal.  Abdominal: Soft. Bowel sounds are normal.  Neurological: She is alert and oriented to person, place, and time.  Skin: Skin is warm and dry.     CMP Latest Ref Rng & Units 11/08/2016  Glucose 65 - 99 mg/dL 78  BUN 7 - 25 mg/dL 8  Creatinine 0.50 - 0.99 mg/dL 0.85  Sodium 135 - 146 mmol/L 144  Potassium 3.5 - 5.3 mmol/L 3.6  Chloride 98 - 110 mmol/L 112(H)  CO2 20 - 31 mmol/L 24  Calcium 8.6 - 10.4 mg/dL 8.5(L)  Total Protein 6.1 - 8.1 g/dL 6.0(L)  Total Bilirubin 0.2 - 1.2 mg/dL 0.5  Alkaline Phos 33 - 130 U/L 62  AST 10 - 35 U/L 27  ALT 6 - 29 U/L 34(H)   CBC Latest Ref Rng & Units 05/04/2017  WBC 3.6 - 11.0 K/uL 6.1  Hemoglobin 12.0 - 16.0 g/dL 12.3  Hematocrit 35.0 - 47.0 % 36.7  Platelets 150 - 440 K/uL  222     Assessment and plan- Patient is a 67 y.o. female with iron deficiency anemia secondary to gastric bypass  1. Patient is no longer anemic today. Iron studies are normal. She does not need IV iron today. Repeat cbc ferritin and iron studies in 3 and 6 months. b12 levels in 6 months. I will see her back in 6 months   Visit Diagnosis 1. H/O gastric bypass   2. Other iron deficiency anemia      Dr. Randa Evens, MD, MPH [facility] Pager- [phone] 05/04/2017 3:29 PM

## 2017-05-11 ENCOUNTER — Ambulatory Visit: Payer: Medicare HMO | Admitting: Family Medicine

## 2017-05-23 ENCOUNTER — Other Ambulatory Visit: Payer: Self-pay | Admitting: Family Medicine

## 2017-05-23 DIAGNOSIS — F320 Major depressive disorder, single episode, mild: Secondary | ICD-10-CM

## 2017-06-02 ENCOUNTER — Encounter: Payer: Self-pay | Admitting: Family Medicine

## 2017-06-02 ENCOUNTER — Ambulatory Visit (INDEPENDENT_AMBULATORY_CARE_PROVIDER_SITE_OTHER): Payer: Medicare HMO

## 2017-06-02 ENCOUNTER — Ambulatory Visit (INDEPENDENT_AMBULATORY_CARE_PROVIDER_SITE_OTHER): Payer: Medicare HMO | Admitting: Family Medicine

## 2017-06-02 VITALS — BP 112/62 | HR 68 | Temp 98.0°F | Resp 12 | Ht 65.0 in | Wt 167.2 lb

## 2017-06-02 DIAGNOSIS — E441 Mild protein-calorie malnutrition: Secondary | ICD-10-CM

## 2017-06-02 DIAGNOSIS — Z0000 Encounter for general adult medical examination without abnormal findings: Secondary | ICD-10-CM | POA: Diagnosis not present

## 2017-06-02 DIAGNOSIS — D692 Other nonthrombocytopenic purpura: Secondary | ICD-10-CM | POA: Diagnosis not present

## 2017-06-02 DIAGNOSIS — E559 Vitamin D deficiency, unspecified: Secondary | ICD-10-CM | POA: Diagnosis not present

## 2017-06-02 DIAGNOSIS — Z1231 Encounter for screening mammogram for malignant neoplasm of breast: Secondary | ICD-10-CM | POA: Diagnosis not present

## 2017-06-02 DIAGNOSIS — Z23 Encounter for immunization: Secondary | ICD-10-CM

## 2017-06-02 DIAGNOSIS — Z1239 Encounter for other screening for malignant neoplasm of breast: Secondary | ICD-10-CM

## 2017-06-02 DIAGNOSIS — E782 Mixed hyperlipidemia: Secondary | ICD-10-CM

## 2017-06-02 DIAGNOSIS — E538 Deficiency of other specified B group vitamins: Secondary | ICD-10-CM

## 2017-06-02 DIAGNOSIS — I10 Essential (primary) hypertension: Secondary | ICD-10-CM | POA: Diagnosis not present

## 2017-06-02 DIAGNOSIS — E2839 Other primary ovarian failure: Secondary | ICD-10-CM

## 2017-06-02 DIAGNOSIS — F5101 Primary insomnia: Secondary | ICD-10-CM

## 2017-06-02 DIAGNOSIS — E8881 Metabolic syndrome: Secondary | ICD-10-CM

## 2017-06-02 DIAGNOSIS — F320 Major depressive disorder, single episode, mild: Secondary | ICD-10-CM | POA: Diagnosis not present

## 2017-06-02 DIAGNOSIS — M2550 Pain in unspecified joint: Secondary | ICD-10-CM

## 2017-06-02 MED ORDER — ATORVASTATIN CALCIUM 40 MG PO TABS: 40.0000 mg | ORAL_TABLET | Freq: Every evening | ORAL | 1 refills | Status: DC

## 2017-06-02 MED ORDER — LISINOPRIL 20 MG PO TABS: 20.0000 mg | ORAL_TABLET | Freq: Every day | ORAL | 1 refills | Status: DC

## 2017-06-02 MED ORDER — TEMAZEPAM 15 MG PO CAPS
15.0000 mg | ORAL_CAPSULE | Freq: Every evening | ORAL | 1 refills | Status: DC | PRN
Start: 2017-06-02 — End: 2024-02-21

## 2017-06-02 MED ORDER — ZOSTER VAC RECOMB ADJUVANTED 50 MCG/0.5ML IM SUSR: 0.5000 mL | Freq: Once | INTRAMUSCULAR | 1 refills | Status: AC

## 2017-06-02 MED ORDER — ATENOLOL 25 MG PO TABS: 25.0000 mg | ORAL_TABLET | Freq: Every day | ORAL | 1 refills | Status: DC

## 2017-06-02 MED ORDER — CITALOPRAM HYDROBROMIDE 20 MG PO TABS: 20.0000 mg | ORAL_TABLET | Freq: Every day | ORAL | 0 refills | Status: DC

## 2017-06-02 NOTE — Patient Instructions (Signed)
Alexandria Newman , Thank you for taking time to come for your Medicare Wellness Visit. I appreciate your ongoing commitment to your health goals. Please review the following plan we discussed and let me know if I can assist you in the future.   Screening recommendations/referrals: Colorectal Screening: Completed colonoscopy 03/21/16. Repeat every 10 years. Mammogram: Ordered today. Please call [phone] to schedule your mammogram.  Bone Density: Completed 06/22/10. Osteoporotic screening no longer required. Lung Cancer Screening: You do not qualify Hepatitis C Screening: Completed 02/07/12 HIV/Syphilis/Hepatitis B Screening: You do not qualify   Vision/Dental Exams: Recommended yearly ophthalmology/optometry visit for glaucoma screening and checkup Recommended yearly dental visit for hygiene and checkup  Vaccinations: Influenza vaccine: Completed 01/30/17 Pneumococcal vaccine: Completed PCV13 02/09/16 and PPSV23 02/04/15 Tdap vaccine: Declined. Please call your insurance company to determine your out of pocket expense. You may also receive this vaccine at your local pharmacy or Health Dept. Shingles vaccine: Please call your insurance company to determine your out of pocket expense for the Shingrix vaccine. You may also receive this vaccine at your local pharmacy or Health Dept.    Advanced directives: Advance directive discussed with you today. I have provided a copy for you to complete at home and have notarized. Once this is complete please bring a copy in to our office so we can scan it into your chart.  Conditions/risks identified: Recommend to drink at least 6-8 8oz glasses of water per day.  Next appointment: Please schedule your Annual Wellness Visit with your Nurse Health Advisor in one year.  Preventive Care 100 Years and Older, Female Preventive care refers to lifestyle choices and visits with your health care provider that can promote health and wellness. What does  preventive care include?  A yearly physical exam. This is also called an annual well check.  Dental exams once or twice a year.  Routine eye exams. Ask your health care provider how often you should have your eyes checked.  Personal lifestyle choices, including:  Daily care of your teeth and gums.  Regular physical activity.  Eating a healthy diet.  Avoiding tobacco and drug use.  Limiting alcohol use.  Practicing safe sex.  Taking low-dose aspirin every day.  Taking vitamin and mineral supplements as recommended by your health care provider. What happens during an annual well check? The services and screenings done by your health care provider during your annual well check will depend on your age, overall health, lifestyle risk factors, and family history of disease. Counseling  Your health care provider may ask you questions about your:  Alcohol use.  Tobacco use.  Drug use.  Emotional well-being.  Home and relationship well-being.  Sexual activity.  Eating habits.  History of falls.  Memory and ability to understand (cognition).  Work and work Statistician.  Reproductive health. Screening  You may have the following tests or measurements:  Height, weight, and BMI.  Blood pressure.  Lipid and cholesterol levels. These may be checked every 5 years, or more frequently if you are over 11 years old.  Skin check.  Lung cancer screening. You may have this screening every year starting at age 29 if you have a 30-pack-year history of smoking and currently smoke or have quit within the past 15 years.  Fecal occult blood test (FOBT) of the stool. You may have this test every year starting at age 29.  Flexible sigmoidoscopy or colonoscopy. You may have a sigmoidoscopy every 5 years or a colonoscopy every 10 years starting  at age 77.  Hepatitis C blood test.  Hepatitis B blood test.  Sexually transmitted disease (STD) testing.  Diabetes screening. This  is done by checking your blood sugar (glucose) after you have not eaten for a while (fasting). You may have this done every 1-3 years.  Bone density scan. This is done to screen for osteoporosis. You may have this done starting at age 52.  Mammogram. This may be done every 1-2 years. Talk to your health care provider about how often you should have regular mammograms. Talk with your health care provider about your test results, treatment options, and if necessary, the need for more tests. Vaccines  Your health care provider may recommend certain vaccines, such as:  Influenza vaccine. This is recommended every year.  Tetanus, diphtheria, and acellular pertussis (Tdap, Td) vaccine. You may need a Td booster every 10 years.  Zoster vaccine. You may need this after age 15.  Pneumococcal 13-valent conjugate (PCV13) vaccine. One dose is recommended after age 33.  Pneumococcal polysaccharide (PPSV23) vaccine. One dose is recommended after age 85. Talk to your health care provider about which screenings and vaccines you need and how often you need them. This information is not intended to replace advice given to you by your health care provider. Make sure you discuss any questions you have with your health care provider. Document Released: 05/08/2015 Document Revised: 12/30/2015 Document Reviewed: 02/10/2015 Elsevier Interactive Patient Education  2017 [place] Prevention in the Home Falls can cause injuries. They can happen to people of all ages. There are many things you can do to make your home safe and to help prevent falls. What can I do on the outside of my home?  Regularly fix the edges of walkways and driveways and fix any cracks.  Remove anything that might make you trip as you walk through a door, such as a raised step or threshold.  Trim any bushes or trees on the path to your home.  Use bright outdoor lighting.  Clear any walking paths of anything that might make  someone trip, such as rocks or tools.  Regularly check to see if handrails are loose or broken. Make sure that both sides of any steps have handrails.  Any raised decks and porches should have guardrails on the edges.  Have any leaves, snow, or ice cleared regularly.  Use sand or salt on walking paths during winter.  Clean up any spills in your garage right away. This includes oil or grease spills. What can I do in the bathroom?  Use night lights.  Install grab bars by the toilet and in the tub and shower. Do not use towel bars as grab bars.  Use non-skid mats or decals in the tub or shower.  If you need to sit down in the shower, use a plastic, non-slip stool.  Keep the floor dry. Clean up any water that spills on the floor as soon as it happens.  Remove soap buildup in the tub or shower regularly.  Attach bath mats securely with double-sided non-slip rug tape.  Do not have throw rugs and other things on the floor that can make you trip. What can I do in the bedroom?  Use night lights.  Make sure that you have a light by your bed that is easy to reach.  Do not use any sheets or blankets that are too big for your bed. They should not hang down onto the floor.  Have a firm  chair that has side arms. You can use this for support while you get dressed.  Do not have throw rugs and other things on the floor that can make you trip. What can I do in the kitchen?  Clean up any spills right away.  Avoid walking on wet floors.  Keep items that you use a lot in easy-to-reach places.  If you need to reach something above you, use a strong step stool that has a grab bar.  Keep electrical cords out of the way.  Do not use floor polish or wax that makes floors slippery. If you must use wax, use non-skid floor wax.  Do not have throw rugs and other things on the floor that can make you trip. What can I do with my stairs?  Do not leave any items on the stairs.  Make sure that  there are handrails on both sides of the stairs and use them. Fix handrails that are broken or loose. Make sure that handrails are as long as the stairways.  Check any carpeting to make sure that it is firmly attached to the stairs. Fix any carpet that is loose or worn.  Avoid having throw rugs at the top or bottom of the stairs. If you do have throw rugs, attach them to the floor with carpet tape.  Make sure that you have a light switch at the top of the stairs and the bottom of the stairs. If you do not have them, ask someone to add them for you. What else can I do to help prevent falls?  Wear shoes that:  Do not have high heels.  Have rubber bottoms.  Are comfortable and fit you well.  Are closed at the toe. Do not wear sandals.  If you use a stepladder:  Make sure that it is fully opened. Do not climb a closed stepladder.  Make sure that both sides of the stepladder are locked into place.  Ask someone to hold it for you, if possible.  Clearly mark and make sure that you can see:  Any grab bars or handrails.  First and last steps.  Where the edge of each step is.  Use tools that help you move around (mobility aids) if they are needed. These include:  Canes.  Walkers.  Scooters.  Crutches.  Turn on the lights when you go into a dark area. Replace any light bulbs as soon as they burn out.  Set up your furniture so you have a clear path. Avoid moving your furniture around.  If any of your floors are uneven, fix them.  If there are any pets around you, be aware of where they are.  Review your medicines with your doctor. Some medicines can make you feel dizzy. This can increase your chance of falling. Ask your doctor what other things that you can do to help prevent falls. This information is not intended to replace advice given to you by your health care provider. Make sure you discuss any questions you have with your health care provider. Document Released:  02/05/2009 Document Revised: 09/17/2015 Document Reviewed: 05/16/2014 Elsevier Interactive Patient Education  2017 [employer].

## 2017-06-02 NOTE — Progress Notes (Signed)
Name: Alexandria Newman [facility]   MRN: [id number]    DOB: 1950/06/24   Date:06/02/2017       Progress Note  Subjective  Chief Complaint  Chief Complaint  Patient presents with  . Annual Exam    Medicare    HPI  HTN: she is taking medication and denies side effects. No chest pain or palpation,.  Hyperlipidemia: taking Lipitor and denies side effects of medication. No myalgias or chest pain. Advised to start aspirin daily, she is going to try the 81 mg  Depression Mild episode: she has been on medication for years. She started taking medication when she had an argument with her sister years ago . She gets along well with her husband. She is taking care of her grandchild born 12/2015  Continue both medications.   Insomnia: she is taking Temazepam prn   Metabolic Syndrome: last UMPN3I 5.4, she denies polyphagia, polydipsia . She always has polyuria. 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 She was seen by Dr. Vicente Males and is taking a fiber supplement and it has helped. She is currently Imodium prn   Senile Purpura: she still bruises on both arms, gave her reassurance   Patient Active Problem List   Diagnosis Date Noted  . Protein calorie malnutrition ([place]) 11/08/2016  . H/O gastric bypass 11/01/2016  . Iron deficiency anemia 02/15/2016  . Senile purpura ([place]) 08/10/2015  . Hyperlipidemia 02/04/2015  . History of small bowel obstruction 02/04/2015  . Hypertension, benign 02/04/2015  . DDD (degenerative  disc disease), cervical 02/04/2015  . 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  . Insomnia 02/04/2015  . Osteoarthritis of finger of right hand 02/04/2015  . Metabolic syndrome 14/43/1540  . Depression, major, single episode, mild ([place]) 02/04/2015  . 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 Vitamin D deficiency 02/04/2015  . Vitamin B12 deficiency 02/04/2015  . Bariatric surgery status     Past Surgical History:  Procedure Laterality Date  . ABDOMINAL HYSTERECTOMY    . APPENDECTOMY    . COLONOSCOPY WITH PROPOFOL N/A 03/21/2016   Procedure: COLONOSCOPY WITH PROPOFOL;  Surgeon: Jonathon Bellows, MD;  Location: [facility] ENDOSCOPY;  Service: Endoscopy;  Laterality: N/A;  . ESOPHAGOGASTRODUODENOSCOPY (EGD) WITH PROPOFOL N/A 03/21/2016   Procedure: ESOPHAGOGASTRODUODENOSCOPY (EGD) WITH PROPOFOL;  Surgeon: Jonathon Bellows, MD;  Location: [facility] ENDOSCOPY;  Service: Endoscopy;  Laterality: N/A;  . GASTRIC BYPASS  2009  . LAPAROSCOPY N/A 01/05/2014   Procedure: LAPAROSCOPY DIAGNOSTIC CONVERTED TO  LAPAROTOMY ILECTOMY;  Surgeon: Erroll Luna, MD;  Location: [facility];  Service: General;  Laterality: N/A;  . PARTIAL COLECTOMY      Family History  Problem Relation Age of Onset  . Heart disease Mother   . CAD Mother   . Kidney disease Mother   . Heart attack Father   . Obesity Sister   . Heart attack Brother        after surgery    Social History   Socioeconomic History  . Marital status: Married    Spouse name:  Coralyn Mark  . Number of children: 2  . Years of education: GED  . Highest education level: Not on file  Social Needs  . Financial resource strain: Not hard at all  . Food insecurity - worry: Never true  . Food insecurity - inability: Never true  . Transportation needs - medical: No  . Transportation needs - non-medical: No  Occupational History  . Occupation: Retired  Tobacco Use  . Smoking status: Never Smoker  . Smokeless tobacco: Never Used  . Tobacco comment: smoking  cessation materials not required  Substance and Sexual Activity  . Alcohol use: No    Alcohol/week: 0.0 oz    Frequency: Never  . Drug use: No  . Sexual activity: No  Other Topics Concern  . Not on file  Social History Narrative  . Not on file     Current Outpatient Medications:  .  acetaminophen (TYLENOL) 325 MG tablet, Take 2 tablets (650 mg total) by mouth every 6 (six) hours as needed for mild pain (or Temp > 100)., Disp: , Rfl:  .  aspirin (ASPIRIN CHILDRENS) 81 MG chewable tablet, Chew 1 tablet (81 mg total) by mouth daily. (Patient not taking: Reported on 05/04/2017), Disp: 30 tablet, Rfl: 0 .  atenolol (TENORMIN) 25 MG tablet, TAKE 1 TABLET EVERY DAY, Disp: 90 tablet, Rfl: 1 .  atorvastatin (LIPITOR) 40 MG tablet, TAKE 1 TABLET EVERY EVENING., Disp: 90 tablet, Rfl: 1 .  buPROPion (WELLBUTRIN XL) 150 MG 24 hr tablet, TAKE 1 TABLET EVERY DAY, Disp: 90 tablet, Rfl: 1 .  citalopram (CELEXA) 20 MG tablet, TAKE 1 TABLET EVERY DAY, Disp: 90 tablet, Rfl: 0 .  ibuprofen (ADVIL,MOTRIN) 200 MG tablet, Take 200 mg by mouth every 6 (six) hours as needed., Disp: , Rfl:  .  lisinopril (PRINIVIL,ZESTRIL) 20 MG tablet, TAKE 1 TABLET EVERY DAY, Disp: 90 tablet, Rfl: 0 .  temazepam (RESTORIL) 15 MG capsule, Take 1 capsule (15 mg total) by mouth at bedtime as needed for sleep., Disp: 90 capsule, Rfl: 1 .  Zoster Vaccine Adjuvanted [facility]) injection, Inject 0.5 mLs into the muscle once for 1 dose., Disp: 0.5 mL, Rfl: 1  Current Facility-Administered Medications:  .  cyanocobalamin ((VITAMIN B-12)) injection 1,000 mcg, 1,000 mcg, Intramuscular, Q30 days, Steele Sizer, MD, 1,000 mcg at 06/02/17 1059 .  cyanocobalamin ((VITAMIN B-12)) injection 1,000 mcg, 1,000 mcg, Subcutaneous, Q30 days, Steele Sizer, MD, 1,000 mcg at 03/27/17 1005  No Known Allergies   ROS  Constitutional: Negative for fever or weight change.  Respiratory: Negative for cough and shortness of breath.   Cardiovascular:  Negative for chest pain or palpitations.  Gastrointestinal: Negative for abdominal pain, no bowel changes.  Musculoskeletal: Negative for gait problem or joint swelling.  Skin: Negative for rash.  Neurological: Negative for dizziness or headache.  No other specific complaints in a complete review of systems (except as listed in HPI above).  Objective  Vitals:   06/02/17 1043  BP: 112/62  Pulse: 68  Resp: 12  Temp: 98 F (36.7 C)  TempSrc: Oral  SpO2: 98%  Weight: 167 lb 3.2 oz (75.8 kg)  Height: 5\' 5"  (1.651 m)    Body mass index is 27.82 kg/m.  Physical Exam  Constitutional: Patient appears well-developed and well-nourished. OverweightNo distress.  HEENT: head atraumatic, normocephalic, pupils equal and reactive to light, ears  neck supple, throat within normal limits Cardiovascular: Normal rate, regular rhythm and normal heart sounds.  No murmur heard. No BLE edema. Pulmonary/Chest: Effort  normal and breath sounds normal. No respiratory distress. Abdominal: Soft.  There is no tenderness. Psychiatric: Patient has a normal mood and affect. behavior is normal. Judgment and thought content normal. Muscular skeletal: he has enlargement of DIP joints, no swelling of wrists but has pain with rom   Recent Results (from the past 2160 hour(s))  Ferritin     Status: None   Collection Time: 05/04/17  9:33 AM  Result Value Ref Range   Ferritin 114 11 - 307 ng/mL    Comment: Performed at [facility], [address] [place], [place] [address]  Iron and TIBC     Status: Abnormal   Collection Time: 05/04/17  9:33 AM  Result Value Ref Range   Iron 123 28 - 170 ug/dL   TIBC 321 250 - 450 ug/dL   Saturation Ratios 38 (H) 10.4 - 31.8 %   UIBC 198 ug/dL    Comment: Performed at [facility], [address] [place], [place] [address]  CBC     Status: None   Collection Time: 05/04/17  9:33 AM  Result Value Ref Range   WBC 6.1 3.6 - 11.0 K/uL   RBC 4.04 3.80 -  5.20 MIL/uL   Hemoglobin 12.3 12.0 - 16.0 g/dL   HCT 36.7 35.0 - 47.0 %   MCV 90.8 80.0 - 100.0 fL   MCH 30.4 26.0 - 34.0 pg   MCHC 33.5 32.0 - 36.0 g/dL   RDW 13.4 11.5 - 14.5 %   Platelets 222 150 - 440 K/uL    Comment: Performed at [facility], [address] [place], [place] [address]     PHQ2/9: Depression screen [facility] 2/9 06/02/2017 06/02/2017 11/08/2016 08/09/2016 02/09/2016  Decreased Interest 0 0 0 0 0  Down, Depressed, Hopeless 0 0 0 0 0  PHQ - 2 Score 0 0 0 0 0     Fall Risk: Fall Risk  06/02/2017 06/02/2017 11/08/2016 08/09/2016 02/09/2016  Falls in the past year? No No No No No      Assessment & Plan  1. Hypertension, benign  - lisinopril (PRINIVIL,ZESTRIL) 20 MG tablet; Take 1 tablet (20 mg total) by mouth daily.  Dispense: 90 tablet; Refill: 1 - atenolol (TENORMIN) 25 MG tablet; Take 1 tablet (25 mg total) by mouth daily.  Dispense: 90 tablet; Refill: 1  2. Mild protein-calorie malnutrition ([place])  Discussed increase in protein intake  3. Depression, major, single episode, mild ([facility])  - citalopram (CELEXA) 20 MG tablet; Take 1 tablet (20 mg total) by mouth daily.  Dispense: 90 tablet; Refill: 0  4. Senile purpura ([facility])  Stable  5. Vitamin B12 deficiency  B12 given today   6. Metabolic syndrome   7. Vitamin D deficiency  Needs to resume Vitamin D otc   8. Need for shingles vaccine  - Zoster Vaccine Adjuvanted [facility]) injection; Inject 0.5 mLs into the muscle once for 1 dose.  Dispense: 0.5 mL; Refill: 1  9. Ovarian failure  - DG Bone Density; Future  10. Arthralgia, unspecified joint  - C-reactive protein - Sedimentation rate - Rheumatoid Factor - ANA+ENA+DNA/DS+Scl 70+SjoSSA/B  11. Primary insomnia  - temazepam (RESTORIL) 15 MG capsule; Take 1 capsule (15 mg total) by mouth at bedtime as needed for sleep.  Dispense: 90 capsule; Refill: 1  12. Mixed hyperlipidemia  - atorvastatin (LIPITOR) 40 MG tablet; Take 1 tablet (40 mg  total) by mouth every evening.  Dispense: 90 tablet; Refill: 1

## 2017-06-02 NOTE — Progress Notes (Signed)
Subjective:   Alexandria Newman is a 67 y.o. female who presents for Medicare Annual (Subsequent) preventive examination.  Review of Systems:  N/A Cardiac Risk Factors include: advanced age (>36men, >71 women);dyslipidemia;hypertension     Objective:     Vitals: BP 112/62 (BP Location: Left Arm, Patient Position: Sitting, Cuff Size: Normal)   Pulse 68   Temp 98 F (36.7 C) (Oral)   Resp 12   Ht 5\' 5"  (1.651 m)   Wt 167 lb 3.2 oz (75.8 kg)   SpO2 98%   BMI 27.82 kg/m   Body mass index is 27.82 kg/m.  Advanced Directives 06/02/2017 05/04/2017 11/08/2016 11/05/2016 11/01/2016 08/18/2016 08/09/2016  Does Patient Have a Medical Advance Directive? No No No No No No No  Would patient like information on creating a medical advance directive? Yes (MAU/Ambulatory/Procedural Areas - Information given) Yes (MAU/Ambulatory/Procedural Areas - Information given) - - - - -  Pre-existing out of facility DNR order (yellow form or pink MOST form) - - - - - - -    Tobacco Social History   Tobacco Use  Smoking Status Never Smoker  Smokeless Tobacco Never Used  Tobacco Comment   smoking cessation materials not required     Counseling given: No Comment: smoking cessation materials not required   Clinical Intake:  Pre-visit preparation completed: Yes  Pain : No/denies pain   BMI - recorded: 28.04 Nutritional Status: BMI 25 -29 Overweight Nutritional Risks: None Has the patient had any N/V/D within the last 2 months?  No Does the patient have any non-healing wounds?  No Has the patient had any unintentional weight loss or weight gain?  No Diabetes: No  How often do you need to have someone help you when you read instructions, pamphlets, or other written materials from your doctor or pharmacy?: 1 - Never  Interpreter Needed?: No  Information entered by :: Idell Pickles, LPN  Past Medical History:  Diagnosis Date  . Anemia   . Anxiety   . B12 deficiency   . Chronic insomnia     . Degeneration of cervical intervertebral disc   . Depression   . Dumping syndrome 2016   per pt had "part of intestines removed"  . Dysfunction of both eustachian tubes   . Hypercholesteremia   . Hypertension   . Metabolic syndrome   . Obesity   . Osteoarthritis of right hand   . Ovarian failure   . TMJ (dislocation of temporomandibular joint)   . Vitamin D deficiency    Past Surgical History:  Procedure Laterality Date  . ABDOMINAL HYSTERECTOMY    . APPENDECTOMY    . COLONOSCOPY WITH PROPOFOL N/A 03/21/2016   Procedure: COLONOSCOPY WITH PROPOFOL;  Surgeon: Jonathon Bellows, MD;  Location: [facility] ENDOSCOPY;  Service: Endoscopy;  Laterality: N/A;  . ESOPHAGOGASTRODUODENOSCOPY (EGD) WITH PROPOFOL N/A 03/21/2016   Procedure: ESOPHAGOGASTRODUODENOSCOPY (EGD) WITH PROPOFOL;  Surgeon: Jonathon Bellows, MD;  Location: [facility] ENDOSCOPY;  Service: Endoscopy;  Laterality: N/A;  . GASTRIC BYPASS  2009  . LAPAROSCOPY N/A 01/05/2014   Procedure: LAPAROSCOPY DIAGNOSTIC CONVERTED TO  LAPAROTOMY ILECTOMY;  Surgeon: Erroll Luna, MD;  Location: [place];  Service: General;  Laterality: N/A;  . PARTIAL COLECTOMY     Family History  Problem Relation Age of Onset  . Heart disease Mother   . CAD Mother   . Kidney disease Mother   . Heart attack Father   . Obesity Sister   . Heart attack Brother  after surgery   Social History   Socioeconomic History  . Marital status: Married    Spouse name: Coralyn Mark  . Number of children: 2  . Years of education: GED  . Highest education level: None  Social Needs  . Financial resource strain: Not hard at all  . Food insecurity - worry: Never true  . Food insecurity - inability: Never true  . Transportation needs - medical: No  . Transportation needs - non-medical: No  Occupational History  . Occupation: Retired  Tobacco Use  . Smoking status: Never Smoker  . Smokeless tobacco: Never Used  . Tobacco comment: smoking cessation materials not required  Substance  and Sexual Activity  . Alcohol use: No    Alcohol/week: 0.0 oz    Frequency: Never  . Drug use: No  . Sexual activity: No  Other Topics Concern  . None  Social History Narrative  . None    Outpatient Encounter Medications as of 06/02/2017  Medication Sig  . acetaminophen (TYLENOL) 325 MG tablet Take 2 tablets (650 mg total) by mouth every 6 (six) hours as needed for mild pain (or Temp > 100).  Marland Kitchen atenolol (TENORMIN) 25 MG tablet TAKE 1 TABLET EVERY DAY  . atorvastatin (LIPITOR) 40 MG tablet TAKE 1 TABLET EVERY EVENING.  Marland Kitchen buPROPion (WELLBUTRIN XL) 150 MG 24 hr tablet TAKE 1 TABLET EVERY DAY  . citalopram (CELEXA) 20 MG tablet TAKE 1 TABLET EVERY DAY  . ibuprofen (ADVIL,MOTRIN) 200 MG tablet Take 200 mg by mouth every 6 (six) hours as needed.  Marland Kitchen lisinopril (PRINIVIL,ZESTRIL) 20 MG tablet TAKE 1 TABLET EVERY DAY  . temazepam (RESTORIL) 15 MG capsule Take 1 capsule (15 mg total) by mouth at bedtime as needed for sleep.  Marland Kitchen aspirin (ASPIRIN CHILDRENS) 81 MG chewable tablet Chew 1 tablet (81 mg total) by mouth daily. (Patient not taking: Reported on 05/04/2017)   Facility-Administered Encounter Medications as of 06/02/2017  Medication  . cyanocobalamin ((VITAMIN B-12)) injection 1,000 mcg  . cyanocobalamin ((VITAMIN B-12)) injection 1,000 mcg    Activities of Daily Living In your present state of health, do you have any difficulty performing the following activities: 06/02/2017 11/08/2016  Hearing? N N  Comment denies hearing aids -  Vision? N N  Comment has eyeglasses but doesn't wear them -  Difficulty concentrating or making decisions? Y N  Comment short term memory loss -  Walking or climbing stairs? N N  Dressing or bathing? N N  Doing errands, shopping? N N  Preparing Food and eating ? N -  Comment denies dentures -  Using the Toilet? N -  In the past six months, have you accidently leaked urine? N -  Do you have problems with loss of bowel control? N -  Managing your  Medications? N -  Managing your Finances? N -  Housekeeping or managing your Housekeeping? N -  Some recent data might be hidden    Patient Care Team: Steele Sizer, MD as PCP - General (Family Medicine) Sindy Guadeloupe, MD as Consulting Physician (Oncology)    Assessment:   This is a routine wellness examination for [place].  Exercise Activities and Dietary recommendations Exercise limited by: None identified  Goals    . DIET - INCREASE WATER INTAKE     Recommend to drink at least 6-8 8oz glasses of water per day.       Fall Risk Fall Risk  06/02/2017 11/08/2016 08/09/2016 02/09/2016 08/10/2015  Falls in the past year?  No No No No No   Is the patient's home free of loose throw rugs in walkways, pet beds, electrical cords, etc?   Yes Does the patient have any grab bars in the bathroom? No  Does the patient use a shower chair when bathing? No Does the patient have any stairs in or around the home? No If so, are there any handrails?  N/A Does the patient have adequate lighting?  Yes Does the patient use a cane, walker or w/c? No Does the patient use of an elevated toilet seat? No  Pt declined my offer to send [employer] Referral to Care Guide  (installation, etc.).  Depression Screen PHQ 2/9 Scores 06/02/2017 11/08/2016 08/09/2016 02/09/2016  PHQ - 2 Score 0 0 0 0     Cognitive Function     6CIT Screen 06/02/2017  What Year? 0 points  What month? 0 points  What time? 0 points  Count back from 20 0 points  Months in reverse 0 points  Repeat phrase 0 points  Total Score 0    Immunization History  Administered Date(s) Administered  . Influenza, High Dose Seasonal PF 01/30/2017  . Influenza,inj,Quad PF,6+ Mos 02/04/2015, 02/09/2016  . Influenza-Unspecified 01/03/2014  . Pneumococcal Conjugate-13 02/09/2016  . Pneumococcal Polysaccharide-23 02/04/2015  . Tdap 03/29/2007  . Zoster 02/06/2012    Qualifies for Shingles Vaccine? Yes. Zostavax completed 02/06/12.  Due for Shingrix vaccine. Education has been provided regarding the importance of this vaccine. Pt has been advised to call her insurance company to determine her out of pocket expense. Advised she may also receive this vaccine at her local pharmacy or Health Dept. Verbalized acceptance and understanding.  Due for Tdap vaccine. Declined my offer to administer today. Education has been provided regarding the importance of this vaccine but still declined. Pt has been advised to call her insurance company to determine her out of pocket expense. Advised she may also receive this vaccine at her local pharmacy or Health Dept. Verbalized acceptance and understanding.  Screening Tests Health Maintenance  Topic Date Due  . MAMMOGRAM  02/18/2016  . TETANUS/TDAP  03/28/2017  . COLONOSCOPY  03/21/2026  . INFLUENZA VACCINE  Completed  . DEXA SCAN  Completed  . Hepatitis C Screening  Completed  . PNA vac Low Risk Adult  Completed    Cancer Screenings: Lung: Low Dose CT Chest recommended if Age 36-80 years, 30 pack-year currently smoking OR have quit w/in 15years. Patient does not qualify. NON-SMOKER Breast:  Up to date on Mammogram? Yes. Completed 02/18/15. Repeat every year. The patient has been provided with contact information to schedule her own appointment. Up to date of Bone Density/Dexa? Yes. Completed 06/22/10. Osteoporotic screening no longer required. Colorectal: Completed colonoscopy 03/21/16. Repeat every 10 years.  Additional Screenings: Hepatitis B/HIV/Syphillis: Does not qualify Hepatitis C Screening: Completed 02/07/12     Plan:  I have personally reviewed and addressed the Medicare Annual Wellness questionnaire and have noted the following in the patient's chart:  A. Medical and social history B. Use of alcohol, tobacco or illicit drugs  C. Current medications and supplements D. Functional ability and status E.  Nutritional status F.  Physical activity G. Advance directives and  Code Status: H. List of other physicians I.  Hospitalizations, surgeries, and ER visits in previous 12 months J.  [place] such as hearing and vision if needed, cognitive and depression L. Referrals and appointments - none  In addition, I have reviewed and discussed with patient  certain preventive protocols, quality metrics, and best practice recommendations. A written personalized care plan for preventive services as well as general preventive health recommendations were provided to patient.  See attached scanned questionnaire for additional information.   Signed,  Aleatha Borer, LPN Nurse Health Advisor  I have reviewed this encounter including the documentation in this note and/or discussed this patient with the Johney Maine, FNP, NP-C. I am certifying that I agree with the content of this note as supervising physician.  Steele Sizer, MD [facility] 06/02/2017, 1:09 PM

## 2017-06-03 LAB — SEDIMENTATION RATE: Sed Rate: 6 mm/h (ref 0–30)

## 2017-06-03 LAB — RHEUMATOID FACTOR: Rhuematoid fact SerPl-aCnc: <14 IU/mL (ref <14)

## 2017-06-03 LAB — C-REACTIVE PROTEIN: CRP: 0.8 mg/L (ref <8.0)

## 2017-06-28 ENCOUNTER — Other Ambulatory Visit: Payer: Medicare HMO

## 2017-07-04 ENCOUNTER — Ambulatory Visit
Admission: RE | Admit: 2017-07-04 | Discharge: 2017-07-04 | Disposition: A | Discharge status: Home / Self Care | Payer: Medicare HMO | Source: Ambulatory Visit | Attending: Family Medicine | Admitting: Family Medicine

## 2017-07-04 ENCOUNTER — Ambulatory Visit (INDEPENDENT_AMBULATORY_CARE_PROVIDER_SITE_OTHER): Payer: Medicare HMO

## 2017-07-04 DIAGNOSIS — Z13820 Encounter for screening for osteoporosis: Secondary | ICD-10-CM | POA: Diagnosis not present

## 2017-07-04 DIAGNOSIS — M8589 Other specified disorders of bone density and structure, multiple sites: Secondary | ICD-10-CM | POA: Diagnosis not present

## 2017-07-04 DIAGNOSIS — Z1231 Encounter for screening mammogram for malignant neoplasm of breast: Secondary | ICD-10-CM | POA: Insufficient documentation

## 2017-07-04 DIAGNOSIS — E538 Deficiency of other specified B group vitamins: Secondary | ICD-10-CM | POA: Diagnosis not present

## 2017-07-04 DIAGNOSIS — Z1239 Encounter for other screening for malignant neoplasm of breast: Secondary | ICD-10-CM

## 2017-07-04 DIAGNOSIS — M8580 Other specified disorders of bone density and structure, unspecified site: Secondary | ICD-10-CM | POA: Insufficient documentation

## 2017-07-04 DIAGNOSIS — E2839 Other primary ovarian failure: Secondary | ICD-10-CM

## 2017-07-04 DIAGNOSIS — Z780 Asymptomatic menopausal state: Secondary | ICD-10-CM | POA: Diagnosis not present

## 2017-07-10 ENCOUNTER — Encounter: Payer: Self-pay | Admitting: Family Medicine

## 2017-08-03 ENCOUNTER — Ambulatory Visit (INDEPENDENT_AMBULATORY_CARE_PROVIDER_SITE_OTHER): Payer: Medicare HMO

## 2017-08-03 ENCOUNTER — Inpatient Hospital Stay: Payer: Medicare HMO | Attending: Oncology

## 2017-08-03 VITALS — BP 112/62 | Ht 65.0 in | Wt 167.0 lb

## 2017-08-03 DIAGNOSIS — D508 Other iron deficiency anemias: Secondary | ICD-10-CM | POA: Insufficient documentation

## 2017-08-03 DIAGNOSIS — E538 Deficiency of other specified B group vitamins: Secondary | ICD-10-CM

## 2017-08-03 DIAGNOSIS — Z23 Encounter for immunization: Secondary | ICD-10-CM | POA: Diagnosis not present

## 2017-08-03 DIAGNOSIS — Z9884 Bariatric surgery status: Secondary | ICD-10-CM

## 2017-08-03 LAB — CBC WITH DIFFERENTIAL/PLATELET
BASOS PCT: 1 %
Basophils Absolute: 0 10*3/uL (ref 0–0.1)
EOS ABS: 0.1 10*3/uL (ref 0–0.7)
Eosinophils Relative: 1 %
HCT: 34.2 % — ABNORMAL LOW (ref 35.0–47.0)
HEMOGLOBIN: 11.7 g/dL — AB (ref 12.0–16.0)
LYMPHS ABS: 2.1 10*3/uL (ref 1.0–3.6)
Lymphocytes Relative: 28 %
MCH: 30.7 pg (ref 26.0–34.0)
MCHC: 34.3 g/dL (ref 32.0–36.0)
MCV: 89.6 fL (ref 80.0–100.0)
MONOS PCT: 6 %
Monocytes Absolute: 0.4 10*3/uL (ref 0.2–0.9)
NEUTROS PCT: 64 %
Neutro Abs: 4.9 10*3/uL (ref 1.4–6.5)
PLATELETS: 226 10*3/uL (ref 150–440)
RBC: 3.82 MIL/uL (ref 3.80–5.20)
RDW: 13.6 % (ref 11.5–14.5)
WBC: 7.5 10*3/uL (ref 3.6–11.0)

## 2017-08-03 LAB — IRON AND TIBC
Iron: 66 ug/dL (ref 28–170)
Saturation Ratios: 23 % (ref 10.4–31.8)
TIBC: 293 ug/dL (ref 250–450)
UIBC: 227 ug/dL

## 2017-08-03 LAB — FERRITIN: Ferritin: 97 ng/mL (ref 11–307)

## 2017-08-03 NOTE — Progress Notes (Signed)
Patient came in for her B-12 injection. She tolerated it well. NKDA.  Patient received Shringrix Vaccine today. She tolerated injection well. Denies being allergic to eggs.

## 2017-08-23 ENCOUNTER — Encounter: Payer: Self-pay | Admitting: Family Medicine

## 2017-08-23 ENCOUNTER — Ambulatory Visit (INDEPENDENT_AMBULATORY_CARE_PROVIDER_SITE_OTHER): Payer: Medicare HMO | Admitting: Family Medicine

## 2017-08-23 VITALS — BP 132/70 | HR 80 | Temp 98.0°F | Resp 18 | Ht 65.0 in | Wt 167.7 lb

## 2017-08-23 DIAGNOSIS — I10 Essential (primary) hypertension: Secondary | ICD-10-CM

## 2017-08-24 NOTE — Progress Notes (Signed)
Left before she was seen, long wait

## 2017-09-04 ENCOUNTER — Ambulatory Visit (INDEPENDENT_AMBULATORY_CARE_PROVIDER_SITE_OTHER): Payer: Medicare HMO

## 2017-09-04 DIAGNOSIS — E538 Deficiency of other specified B group vitamins: Secondary | ICD-10-CM

## 2017-09-21 ENCOUNTER — Ambulatory Visit: Payer: Self-pay | Admitting: *Deleted

## 2017-09-21 NOTE — Telephone Encounter (Signed)
Patient advised to go to [facility] to be evaulated pt advised that the er may be a better choice  As they would be able to do more tests and would be able to r/o  Any cardiac problems . No availability at [facility] spoke with MELISSA

## 2017-09-21 NOTE — Telephone Encounter (Signed)
  Reason for Disposition . [1] Chest pain lasts > 5 minutes AND [2] occurred > 3 days ago (72 hours) AND [3] NO chest pain or cardiac symptoms now  Answer Assessment - Initial Assessment Questions 1. LOCATION: "Where does it hurt?"         l side chest under breast bone   2. RADIATION: "Does the pain go anywhere else?" (e.g., into neck, jaw, arms, back)      Pain radiates to middle  And  r  Side   3. ONSET: "When did the chest pain begin?" (Minutes, hours or days)        6 weeks ago  4. PATTERN "Does the pain come and go, or has it been constant since it started?"  "Does it get worse with exertion?"       Comes and goes  5. DURATION: "How long does it last" (e.g., seconds, minutes, hours)       approx 30 mins   6. SEVERITY: "How bad is the pain?"  (e.g., Scale 1-10; mild, moderate, or severe)    - MILD (1-3): doesn't interfere with normal activities     - MODERATE (4-7): interferes with normal activities or awakens from sleep    - SEVERE (8-10): excruciating pain, unable to do any normal activities          2 - mild  7. CARDIAC RISK FACTORS: "Do you have any history of heart problems or risk factors for heart disease?" (e.g., prior heart attack, angina; high blood pressure, diabetes, being overweight, high cholesterol, smoking, or strong family history of heart disease)     HTN  STRONG FAMILY  HISTORY   8. PULMONARY RISK FACTORS: "Do you have any history of lung disease?"  (e.g., blood clots in lung, asthma, emphysema, birth control pills)     NO 9. CAUSE: "What do you think is causing the chest pain?"     No idea   10. OTHER SYMPTOMS: "Do you have any other symptoms?" (e.g., dizziness, nausea, vomiting, sweating, fever, difficulty breathing, cough)       L arm is  Sore from shingles shot   11. PREGNANCY: "Is there any chance you are pregnant?" "When was your last menstrual period?"       n/a  Protocols used: CHEST PAIN-A-AH

## 2017-09-25 ENCOUNTER — Other Ambulatory Visit: Payer: Self-pay | Admitting: Family Medicine

## 2017-09-25 DIAGNOSIS — F320 Major depressive disorder, single episode, mild: Secondary | ICD-10-CM

## 2017-09-28 ENCOUNTER — Encounter: Payer: Self-pay | Admitting: Family Medicine

## 2017-09-29 ENCOUNTER — Other Ambulatory Visit: Payer: Self-pay | Admitting: Family Medicine

## 2017-09-29 DIAGNOSIS — R197 Diarrhea, unspecified: Secondary | ICD-10-CM

## 2017-09-29 DIAGNOSIS — Z9884 Bariatric surgery status: Secondary | ICD-10-CM

## 2017-10-11 ENCOUNTER — Ambulatory Visit (INDEPENDENT_AMBULATORY_CARE_PROVIDER_SITE_OTHER): Payer: Medicare HMO

## 2017-10-11 DIAGNOSIS — E538 Deficiency of other specified B group vitamins: Secondary | ICD-10-CM

## 2017-10-16 DIAGNOSIS — R197 Diarrhea, unspecified: Secondary | ICD-10-CM | POA: Diagnosis not present

## 2017-10-16 DIAGNOSIS — Z9884 Bariatric surgery status: Secondary | ICD-10-CM | POA: Diagnosis not present

## 2017-11-02 ENCOUNTER — Encounter: Payer: Self-pay | Admitting: Oncology

## 2017-11-02 ENCOUNTER — Inpatient Hospital Stay: Payer: Medicare HMO | Attending: Oncology

## 2017-11-02 ENCOUNTER — Inpatient Hospital Stay: Payer: Medicare HMO | Admitting: Oncology

## 2017-11-02 ENCOUNTER — Other Ambulatory Visit: Payer: Self-pay

## 2017-11-02 VITALS — BP 135/83 | HR 64 | Temp 97.9°F | Resp 12 | Ht 65.0 in | Wt 170.2 lb

## 2017-11-02 DIAGNOSIS — E538 Deficiency of other specified B group vitamins: Secondary | ICD-10-CM

## 2017-11-02 DIAGNOSIS — D509 Iron deficiency anemia, unspecified: Secondary | ICD-10-CM | POA: Insufficient documentation

## 2017-11-02 DIAGNOSIS — D508 Other iron deficiency anemias: Secondary | ICD-10-CM | POA: Diagnosis not present

## 2017-11-02 DIAGNOSIS — R5383 Other fatigue: Secondary | ICD-10-CM | POA: Diagnosis not present

## 2017-11-02 DIAGNOSIS — Z8249 Family history of ischemic heart disease and other diseases of the circulatory system: Secondary | ICD-10-CM | POA: Diagnosis not present

## 2017-11-02 DIAGNOSIS — Z7982 Long term (current) use of aspirin: Secondary | ICD-10-CM

## 2017-11-02 DIAGNOSIS — Z79899 Other long term (current) drug therapy: Secondary | ICD-10-CM | POA: Insufficient documentation

## 2017-11-02 DIAGNOSIS — Z9884 Bariatric surgery status: Secondary | ICD-10-CM

## 2017-11-02 LAB — IRON AND TIBC
Iron: 60 ug/dL (ref 28–170)
SATURATION RATIOS: 20 % (ref 10.4–31.8)
TIBC: 304 ug/dL (ref 250–450)
UIBC: 244 ug/dL

## 2017-11-02 LAB — CBC WITH DIFFERENTIAL/PLATELET
BASOS ABS: 0 10*3/uL (ref 0–0.1)
Basophils Relative: 1 %
EOS ABS: 0.1 10*3/uL (ref 0–0.7)
EOS PCT: 2 %
HCT: 34.2 % — ABNORMAL LOW (ref 35.0–47.0)
Hemoglobin: 11.6 g/dL — ABNORMAL LOW (ref 12.0–16.0)
Lymphocytes Relative: 37 %
Lymphs Abs: 2 10*3/uL (ref 1.0–3.6)
MCH: 30.5 pg (ref 26.0–34.0)
MCHC: 33.9 g/dL (ref 32.0–36.0)
MCV: 90 fL (ref 80.0–100.0)
Monocytes Absolute: 0.4 10*3/uL (ref 0.2–0.9)
Monocytes Relative: 8 %
NEUTROS PCT: 52 %
Neutro Abs: 2.9 10*3/uL (ref 1.4–6.5)
PLATELETS: 208 10*3/uL (ref 150–440)
RBC: 3.81 MIL/uL (ref 3.80–5.20)
RDW: 13.1 % (ref 11.5–14.5)
WBC: 5.5 10*3/uL (ref 3.6–11.0)

## 2017-11-02 LAB — FERRITIN: FERRITIN: 85 ng/mL (ref 11–307)

## 2017-11-02 NOTE — Progress Notes (Signed)
Patient here for follow up. She reports being very tired for the past month.

## 2017-11-03 LAB — VITAMIN B12: Vitamin B-12: 392 pg/mL (ref 180–914)

## 2017-11-05 ENCOUNTER — Other Ambulatory Visit: Payer: Self-pay

## 2017-11-05 ENCOUNTER — Encounter (HOSPITAL_COMMUNITY): Payer: Self-pay | Admitting: Emergency Medicine

## 2017-11-05 ENCOUNTER — Emergency Department (HOSPITAL_COMMUNITY): Payer: Medicare HMO

## 2017-11-05 ENCOUNTER — Emergency Department (HOSPITAL_COMMUNITY)
Admission: EM | Admit: 2017-11-05 | Discharge: 2017-11-05 | Disposition: A | Discharge status: Home / Self Care | Payer: Medicare HMO | Attending: Emergency Medicine | Admitting: Emergency Medicine

## 2017-11-05 DIAGNOSIS — Z9884 Bariatric surgery status: Secondary | ICD-10-CM | POA: Diagnosis not present

## 2017-11-05 DIAGNOSIS — Z79899 Other long term (current) drug therapy: Secondary | ICD-10-CM | POA: Diagnosis not present

## 2017-11-05 DIAGNOSIS — Z8249 Family history of ischemic heart disease and other diseases of the circulatory system: Secondary | ICD-10-CM | POA: Insufficient documentation

## 2017-11-05 DIAGNOSIS — I10 Essential (primary) hypertension: Secondary | ICD-10-CM | POA: Diagnosis not present

## 2017-11-05 DIAGNOSIS — R61 Generalized hyperhidrosis: Secondary | ICD-10-CM | POA: Diagnosis not present

## 2017-11-05 DIAGNOSIS — R079 Chest pain, unspecified: Secondary | ICD-10-CM

## 2017-11-05 DIAGNOSIS — M542 Cervicalgia: Secondary | ICD-10-CM | POA: Insufficient documentation

## 2017-11-05 DIAGNOSIS — R0789 Other chest pain: Secondary | ICD-10-CM | POA: Diagnosis not present

## 2017-11-05 LAB — I-STAT TROPONIN, ED
Troponin i, poc: 0 ng/mL (ref 0.00–0.08)
Troponin i, poc: 0 ng/mL (ref 0.00–0.08)

## 2017-11-05 LAB — BASIC METABOLIC PANEL
ANION GAP: 7 (ref 5–15)
BUN: 9 mg/dL (ref 8–23)
CHLORIDE: 105 mmol/L (ref 98–111)
CO2: 27 mmol/L (ref 22–32)
Calcium: 8.7 mg/dL — ABNORMAL LOW (ref 8.9–10.3)
Creatinine, Ser: 0.69 mg/dL (ref 0.44–1.00)
GFR calc non Af Amer: >60 mL/min (ref >60)
GLUCOSE: 95 mg/dL (ref 70–99)
Potassium: 3.8 mmol/L (ref 3.5–5.1)
Sodium: 139 mmol/L (ref 135–145)

## 2017-11-05 LAB — CBC
HCT: 39.5 % (ref 36.0–46.0)
HEMOGLOBIN: 12.5 g/dL (ref 12.0–15.0)
MCH: 29.6 pg (ref 26.0–34.0)
MCHC: 31.6 g/dL (ref 30.0–36.0)
MCV: 93.4 fL (ref 78.0–100.0)
Platelets: 202 10*3/uL (ref 150–400)
RBC: 4.23 MIL/uL (ref 3.87–5.11)
RDW: 13 % (ref 11.5–15.5)
WBC: 5.6 10*3/uL (ref 4.0–10.5)

## 2017-11-05 NOTE — ED Provider Notes (Signed)
[facility] EMERGENCY DEPARTMENT Provider Note   CSN: [id number] Arrival date & time: 11/05/17  0945     History   Chief Complaint Chief Complaint  Patient presents with  . Chest Pain  . Neck Pain    HPI Alexandria Newman is a 67 y.o. female.  HPI Pt states she has been having pain in her chest.  It started a couple of weeks ago.  The episodes last 3-4 minutes at a time.   It is sharp pain on the either side of the chest.  Today it went down to her arm.  She feels like she is getting a shot in her arm. NO nausea or shortness of breath.  Last night she was diaphoretic with one episdoe.  The sx have not been getting better so she decided to come in.  NO increase with exertion.  Last episode was last evening.  No hx of heart or lung disease.  Family hx, dad had an MI/died in the 50s.  Brother had an MI/ died in his 60s Past Medical History:  Diagnosis Date  . Anemia   . Anxiety   . B12 deficiency   . Chronic insomnia   . Degeneration of cervical intervertebral disc   . Depression   . Dumping syndrome 2016   per pt had "part of intestines removed"  . Dysfunction of both eustachian tubes   . Hypercholesteremia   . Hypertension   . Metabolic syndrome   . Obesity   . Osteoarthritis of right hand   . Ovarian failure   . TMJ (dislocation of temporomandibular joint)   . Vitamin D deficiency     Patient Active Problem List   Diagnosis Date Noted  . Protein calorie malnutrition ([place]) 11/08/2016  . H/O gastric bypass 11/01/2016  . Iron deficiency anemia 02/15/2016  . Senile purpura ([place]) 08/10/2015  . Hyperlipidemia 02/04/2015  . History of small bowel obstruction 02/04/2015  . Hypertension, benign 02/04/2015  . DDD (degenerative disc disease), cervical 02/04/2015  . 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  . Insomnia 02/04/2015  . Osteoarthritis of finger of right hand 02/04/2015  . Metabolic syndrome 86/76/7209  . Depression,  major, single episode, mild ([place]) 02/04/2015  . 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 Vitamin D deficiency 02/04/2015  . Vitamin B12 deficiency 02/04/2015  . Bariatric surgery status     Past Surgical History:  Procedure Laterality Date  . ABDOMINAL HYSTERECTOMY    . APPENDECTOMY    . COLONOSCOPY WITH PROPOFOL N/A 03/21/2016   Procedure: COLONOSCOPY WITH PROPOFOL;  Surgeon: Jonathon Bellows, MD;  Location: [facility] ENDOSCOPY;  Service: Endoscopy;  Laterality: N/A;  . ESOPHAGOGASTRODUODENOSCOPY (EGD) WITH PROPOFOL N/A 03/21/2016   Procedure: ESOPHAGOGASTRODUODENOSCOPY (EGD) WITH PROPOFOL;  Surgeon: Jonathon Bellows, MD;  Location: [facility] ENDOSCOPY;  Service: Endoscopy;  Laterality: N/A;  . GASTRIC BYPASS  2009  . LAPAROSCOPY N/A 01/05/2014   Procedure: LAPAROSCOPY DIAGNOSTIC CONVERTED TO  LAPAROTOMY ILECTOMY;  Surgeon: Erroll Luna, MD;  Location: [place];  Service: General;  Laterality: N/A;  . PARTIAL COLECTOMY       OB History    Gravida      Para      Term      Preterm      AB      Living  2     SAB      TAB      Ectopic      Multiple      Live Births  Home Medications    Prior to Admission medications   Medication Sig Start Date End Date Taking? Authorizing Provider  acetaminophen (TYLENOL) 325 MG tablet Take 2 tablets (650 mg total) by mouth every 6 (six) hours as needed for mild pain (or Temp > 100). 11/07/13  Yes Earnstine Regal, PA-C  aspirin (ASPIRIN CHILDRENS) 81 MG chewable tablet Chew 1 tablet (81 mg total) by mouth daily. 11/08/16  Yes Sowles, Drue Stager, MD  atenolol (TENORMIN) 25 MG tablet Take 1 tablet (25 mg total) by mouth daily. 06/02/17  Yes Sowles, Drue Stager, MD  atorvastatin (LIPITOR) 40 MG tablet Take 1 tablet (40 mg total) by mouth every evening. 06/02/17  Yes Sowles, Drue Stager, MD  buPROPion (WELLBUTRIN XL) 150 MG 24 hr tablet TAKE 1 TABLET EVERY DAY 09/25/17  Yes Sowles, Drue Stager, MD  citalopram (CELEXA) 20 MG tablet TAKE 1 TABLET EVERY  DAY Patient taking differently: TAKE 1 TABLET 20mg  EVERY DAY 09/25/17  Yes Sowles, Drue Stager, MD  lisinopril (PRINIVIL,ZESTRIL) 20 MG tablet Take 1 tablet (20 mg total) by mouth daily. 06/02/17  Yes Sowles, Drue Stager, MD  Menthol-Camphor (ICY HOT ADVANCED RELIEF) 16-11 % CREA Apply 1 application topically as needed (for neck and wrist pain).   Yes [provider]  temazepam (RESTORIL) 15 MG capsule Take 1 capsule (15 mg total) by mouth at bedtime as needed for sleep. Patient not taking: Reported on 11/05/2017 06/02/17   Steele Sizer, MD    Family History Family History  Problem Relation Age of Onset  . Heart disease Mother   . CAD Mother   . Kidney disease Mother   . Heart attack Father   . Obesity Sister   . Heart attack Brother        after surgery    Social History Social History   Tobacco Use  . Smoking status: Never Smoker  . Smokeless tobacco: Never Used  . Tobacco comment: smoking cessation materials not required  Substance Use Topics  . Alcohol use: No    Alcohol/week: 0.0 oz    Frequency: Never  . Drug use: No     Allergies   Patient has no known allergies.   Review of Systems Review of Systems  All other systems reviewed and are negative.    Physical Exam Updated Vital Signs BP (!) 150/58   Pulse 63   Temp 98.4 F (36.9 C) (Oral)   Resp 16   SpO2 99%   Physical Exam  Constitutional: She appears well-developed and well-nourished. No distress.  HENT:  Head: Normocephalic and atraumatic.  Right Ear: External ear normal.  Left Ear: External ear normal.  Eyes: Conjunctivae are normal. Right eye exhibits no discharge. Left eye exhibits no discharge. No scleral icterus.  Neck: Neck supple. No tracheal deviation present.  Cardiovascular: Normal rate, regular rhythm and intact distal pulses.  Pulmonary/Chest: Effort normal and breath sounds normal. No stridor. No respiratory distress. She has no wheezes. She has no rales.  Abdominal: Soft. Bowel  sounds are normal. She exhibits no distension. There is no tenderness. There is no rebound and no guarding.  Musculoskeletal: She exhibits no edema or tenderness.  Neurological: She is alert. She has normal strength. No cranial nerve deficit (no facial droop, extraocular movements intact, no slurred speech) or sensory deficit. She exhibits normal muscle tone. She displays no seizure activity. Coordination normal.  Skin: Skin is warm and dry. No rash noted.  Psychiatric: She has a normal mood and affect.  Nursing note and vitals reviewed.  ED Treatments / Results  Labs (all labs ordered are listed, but only abnormal results are displayed) Labs Reviewed  BASIC METABOLIC PANEL - Abnormal; Notable for the following components:      Result Value   Calcium 8.7 (*)    All other components within normal limits  CBC  I-STAT TROPONIN, ED  I-STAT TROPONIN, ED    EKG EKG Interpretation  Date/Time:  Sunday November 05 2017 09:53:15 EDT Ventricular Rate:  69 PR Interval:  152 QRS Duration: 90 QT Interval:  392 QTC Calculation: 420 R Axis:   65 Text Interpretation:  Normal sinus rhythm Low voltage QRS Borderline ECG No significant change since last tracing Confirmed by Dorie Rank [id number]) on 11/05/2017 11:15:19 AM   Radiology Dg Chest 2 View  Result Date: 11/05/2017 CLINICAL DATA:  Chest pain EXAM: CHEST - 2 VIEW COMPARISON:  07/20/2016 FINDINGS: The heart size and mediastinal contours are within normal limits. Both lungs are clear. The visualized skeletal structures are unremarkable. IMPRESSION: No active cardiopulmonary disease. Electronically Signed   By: Kerby Moors M.D.   On: 11/05/2017 10:21    Procedures Procedures (including critical care time)  Medications Ordered in ED Medications - No data to display   Initial Impression / Assessment and Plan / ED Course  I have reviewed the triage vital signs and the nursing notes.  Pertinent labs & imaging results that were available  during my care of the patient were reviewed by me and considered in my medical decision making (see chart for details).  Clinical Course as of Nov 05 1453  Sun Nov 05, 2017  1317 Pt is moderate risk per heart score based on age and risk factors alone.  Some features are atypical and not typical for unstable angina   [JK]  1445 Delta trop negative.  Will consult with cardiology   [JK]  1454 Discussed with Dr Lovena Le, Cardiology.  Reasonable candidate for close follow up in the [facility] Heart clinic   [JK]    Clinical Course User Index [JK] Dorie Rank, MD   Patient presented to the emergency room for evaluation of chest pain.  Patient does not have a history of heart disease but she does have a strong family history.   She has a moderate risk heart score based on her age and risk factors.  However, she has some atypical features and there is no exertional component. Pt feels well.  She has no complaints at this time.  Pt appears stable for close outpatient follow up with Cardiology.   Warning signs and precautions discussed.   Final Clinical Impressions(s) / ED Diagnoses   Final diagnoses:  Chest pain, unspecified type    ED Discharge Orders    None       Dorie Rank, MD 11/05/17 1507

## 2017-11-05 NOTE — Discharge Instructions (Signed)
Continue to take an aspirin daily, follow-up with cardiologist for further evaluation as we discussed, return as needed for recurrent episodes

## 2017-11-05 NOTE — ED Notes (Signed)
Pt ambulated to room from waiting room, tolerated well. 

## 2017-11-05 NOTE — ED Notes (Signed)
Pt ambulated to restroom, tolerated well.

## 2017-11-05 NOTE — ED Notes (Signed)
D/c reviewed with patient 

## 2017-11-05 NOTE — ED Triage Notes (Signed)
Pt reports chest pain that began on Friday night and radiates to her left arm, also reports posterior neck pain as well.

## 2017-11-05 NOTE — ED Notes (Signed)
Patient reports left chest pain, and shoulder pain. She also reports that the "back of my head" hurts. She reports that she took a shingles vaccine but that "it had been a while since I took the shot"

## 2017-11-06 NOTE — Progress Notes (Signed)
Hematology/Oncology Consult note [facility]  [phone] [phone]  Patient Care Team: Steele Sizer, MD as PCP - General (Family Medicine) Sindy Guadeloupe, MD as Consulting Physician (Oncology)   Name of the patient: Alexandria Newman  [id number]  02-04-1951   Date of visit: 11/06/17  Diagnosis- iron deficiency anemia likely secondary to gastric bypass   Chief complaint/ Reason for visit- routine f/u of anemia  Heme/Onc history: patient is a 67 year old female with a history of gastric bypass about 10 years ago. She has been taking oral iron and B12 intermittently. She was evaluated by Dr.Annafor iron deficiency anemia and underwent EGD and colonoscopy recently which were normal. She is yet to have capsule endoscopy. She did have further workup from his side for iron deficiency anemia includingUa, parietal cell ab,IF antibody ,TSH,celiac serology, MMA,Homocysteinewhich were normal.EGD,colonoscopy were normal .Her recent hemoglobin from your 05/01/2016 showed white count of 7.2, H&H of 11.3/36.5 with a platelet count of 360. Her hemoglobin has been ranging between 9-10 prior to that. In December 2017 her ferritin was 11 and iron studies showed a low iron saturation of 10%. TIBC at that time was normal at 418. Repeat iron studies from 08/09/2016 showed ferritin of 23, iron saturation was low normal at 14%. Patient reports feeling well and denies any fatigue or other symptoms. Patient was also found to have low borderline levels of B12 and is currently getting monthly B12 through Dr. Ancil Boozer. On one occasion patient was noted to have monocytosis on her CBC with an absolute monocyte count of 1012  Patient received 2 doses of feraheme in April 2018  Interval history- does report feeling more fatigued in the last 1 month. Denies any blood in stool or urine. Denies other complaints  ECOG PS- 1 Pain scale- 0   Review of systems-  Review of Systems  Constitutional: Positive for malaise/fatigue. Negative for chills, fever and weight loss.  HENT: Negative for congestion, ear discharge and nosebleeds.   Eyes: Negative for blurred vision.  Respiratory: Negative for cough, hemoptysis, sputum production, shortness of breath and wheezing.   Cardiovascular: Negative for chest pain, palpitations, orthopnea and claudication.  Gastrointestinal: Negative for abdominal pain, blood in stool, constipation, diarrhea, heartburn, melena, nausea and vomiting.  Genitourinary: Negative for dysuria, flank pain, frequency, hematuria and urgency.  Musculoskeletal: Negative for back pain, joint pain and myalgias.  Skin: Negative for rash.  Neurological: Negative for dizziness, tingling, focal weakness, seizures, weakness and headaches.  Endo/Heme/Allergies: Does not bruise/bleed easily.  Psychiatric/Behavioral: Negative for depression and suicidal ideas. The patient does not have insomnia.      No Known Allergies   Past Medical History:  Diagnosis Date  . Anemia   . Anxiety   . B12 deficiency   . Chronic insomnia   . Degeneration of cervical intervertebral disc   . Depression   . Dumping syndrome 2016   per pt had "part of intestines removed"  . Dysfunction of both eustachian tubes   . Hypercholesteremia   . Hypertension   . Metabolic syndrome   . Obesity   . Osteoarthritis of right hand   . Ovarian failure   . TMJ (dislocation of temporomandibular joint)   . Vitamin D deficiency      Past Surgical History:  Procedure Laterality Date  . ABDOMINAL HYSTERECTOMY    . APPENDECTOMY    . COLONOSCOPY WITH PROPOFOL N/A 03/21/2016   Procedure: COLONOSCOPY WITH PROPOFOL;  Surgeon: Jonathon Bellows, MD;  Location: [facility]  ENDOSCOPY;  Service: Endoscopy;  Laterality: N/A;  . ESOPHAGOGASTRODUODENOSCOPY (EGD) WITH PROPOFOL N/A 03/21/2016   Procedure: ESOPHAGOGASTRODUODENOSCOPY (EGD) WITH PROPOFOL;  Surgeon: Jonathon Bellows, MD;  Location: [facility]  ENDOSCOPY;  Service: Endoscopy;  Laterality: N/A;  . GASTRIC BYPASS  2009  . LAPAROSCOPY N/A 01/05/2014   Procedure: LAPAROSCOPY DIAGNOSTIC CONVERTED TO  LAPAROTOMY ILECTOMY;  Surgeon: Erroll Luna, MD;  Location: [place];  Service: General;  Laterality: N/A;  . PARTIAL COLECTOMY      Social History   Socioeconomic History  . Marital status: Married    Spouse name: Coralyn Mark  . Number of children: 2  . Years of education: GED  . Highest education level: Not on file  Occupational History  . Occupation: Retired  Scientific laboratory technician  . Financial resource strain: Not hard at all  . Food insecurity:    Worry: Never true    Inability: Never true  . Transportation needs:    Medical: No    Non-medical: No  Tobacco Use  . Smoking status: Never Smoker  . Smokeless tobacco: Never Used  . Tobacco comment: smoking cessation materials not required  Substance and Sexual Activity  . Alcohol use: No    Alcohol/week: 0.0 oz    Frequency: Never  . Drug use: No  . Sexual activity: Never  Lifestyle  . Physical activity:    Days per week: 2 days    Minutes per session: 30 min  . Stress: Not at all  Relationships  . Social connections:    Talks on phone: Patient refused    Gets together: Patient refused    Attends religious service: Patient refused    Active member of club or organization: Patient refused    Attends meetings of clubs or organizations: Patient refused    Relationship status: Married  . Intimate partner violence:    Fear of current or ex partner: No    Emotionally abused: No    Physically abused: No    Forced sexual activity: No  Other Topics Concern  . Not on file  Social History Narrative  . Not on file    Family History  Problem Relation Age of Onset  . Heart disease Mother   . CAD Mother   . Kidney disease Mother   . Heart attack Father   . Obesity Sister   . Heart attack Brother        after surgery     Current Outpatient Medications:  .  acetaminophen  (TYLENOL) 325 MG tablet, Take 2 tablets (650 mg total) by mouth every 6 (six) hours as needed for mild pain (or Temp > 100)., Disp: , Rfl:  .  aspirin (ASPIRIN CHILDRENS) 81 MG chewable tablet, Chew 1 tablet (81 mg total) by mouth daily., Disp: 30 tablet, Rfl: 0 .  atenolol (TENORMIN) 25 MG tablet, Take 1 tablet (25 mg total) by mouth daily., Disp: 90 tablet, Rfl: 1 .  atorvastatin (LIPITOR) 40 MG tablet, Take 1 tablet (40 mg total) by mouth every evening., Disp: 90 tablet, Rfl: 1 .  buPROPion (WELLBUTRIN XL) 150 MG 24 hr tablet, TAKE 1 TABLET EVERY DAY, Disp: 90 tablet, Rfl: 0 .  citalopram (CELEXA) 20 MG tablet, TAKE 1 TABLET EVERY DAY (Patient taking differently: TAKE 1 TABLET 20mg  EVERY DAY), Disp: 90 tablet, Rfl: 0 .  lisinopril (PRINIVIL,ZESTRIL) 20 MG tablet, Take 1 tablet (20 mg total) by mouth daily., Disp: 90 tablet, Rfl: 1 .  temazepam (RESTORIL) 15 MG capsule, Take 1 capsule (15 mg  total) by mouth at bedtime as needed for sleep. (Patient not taking: Reported on 11/05/2017), Disp: 90 capsule, Rfl: 1 .  Menthol-Camphor (ICY HOT ADVANCED RELIEF) 16-11 % CREA, Apply 1 application topically as needed (for neck and wrist pain)., Disp: , Rfl:   Current Facility-Administered Medications:  .  cyanocobalamin ((VITAMIN B-12)) injection 1,000 mcg, 1,000 mcg, Subcutaneous, Q30 days, Steele Sizer, MD, 1,000 mcg at 10/11/17 1046  Physical exam:  Vitals:   11/02/17 1357 11/02/17 1401  BP:  135/83  Pulse:  64  Resp: 12   Temp:  97.9 F (36.6 C)  Weight: 170 lb 3.2 oz (77.2 kg)   Height: 5\' 5"  (1.651 m)    Physical Exam  Constitutional: She is oriented to person, place, and time. She appears well-developed and well-nourished.  HENT:  Head: Normocephalic and atraumatic.  Eyes: Pupils are equal, round, and reactive to light. EOM are normal.  Neck: Normal range of motion.  Cardiovascular: Normal rate, regular rhythm and normal heart sounds.  Pulmonary/Chest: Effort normal and breath sounds  normal.  Abdominal: Soft. Bowel sounds are normal.  Neurological: She is alert and oriented to person, place, and time.  Skin: Skin is warm and dry.     CMP Latest Ref Rng & Units 11/05/2017  Glucose 70 - 99 mg/dL 95  BUN 8 - 23 mg/dL 9  Creatinine 0.44 - 1.00 mg/dL 0.69  Sodium 135 - 145 mmol/L 139  Potassium 3.5 - 5.1 mmol/L 3.8  Chloride 98 - 111 mmol/L 105  CO2 22 - 32 mmol/L 27  Calcium 8.9 - 10.3 mg/dL 8.7(L)  Total Protein 6.1 - 8.1 g/dL -  Total Bilirubin 0.2 - 1.2 mg/dL -  Alkaline Phos 33 - 130 U/L -  AST 10 - 35 U/L -  ALT 6 - 29 U/L -   CBC Latest Ref Rng & Units 11/05/2017  WBC 4.0 - 10.5 K/uL 5.6  Hemoglobin 12.0 - 15.0 g/dL 12.5  Hematocrit 36.0 - 46.0 % 39.5  Platelets 150 - 400 K/uL 202    No images are attached to the encounter.  Dg Chest 2 View  Result Date: 11/05/2017 CLINICAL DATA:  Chest pain EXAM: CHEST - 2 VIEW COMPARISON:  07/20/2016 FINDINGS: The heart size and mediastinal contours are within normal limits. Both lungs are clear. The visualized skeletal structures are unremarkable. IMPRESSION: No active cardiopulmonary disease. Electronically Signed   By: Kerby Moors M.D.   On: 11/05/2017 10:21     Assessment and plan- Patient is a 67 y.o. female with iron deficiency anemia likely due to gastric bypass  She is not anemic today. Iron studies are within normal limits. She is on monthly b12 shots. I do not think her fatigue is due to iron deficiency  I will repeat cbc ferritin and iron studies and see her back in 6 months. She has not required IV iron in over a year and her hb has been ~12 in the last 1 year   Visit Diagnosis 1. Other iron deficiency anemia   2. H/O gastric bypass      Dr. Randa Evens, MD, MPH [facility] [phone] 11/06/2017 8:05 AM

## 2017-11-16 ENCOUNTER — Ambulatory Visit (INDEPENDENT_AMBULATORY_CARE_PROVIDER_SITE_OTHER): Payer: Medicare HMO

## 2017-11-16 DIAGNOSIS — Z7689 Persons encountering health services in other specified circumstances: Secondary | ICD-10-CM | POA: Diagnosis not present

## 2017-11-16 DIAGNOSIS — E538 Deficiency of other specified B group vitamins: Secondary | ICD-10-CM

## 2017-11-16 DIAGNOSIS — E785 Hyperlipidemia, unspecified: Secondary | ICD-10-CM | POA: Diagnosis not present

## 2017-11-16 DIAGNOSIS — F411 Generalized anxiety disorder: Secondary | ICD-10-CM | POA: Diagnosis not present

## 2017-11-16 DIAGNOSIS — I10 Essential (primary) hypertension: Secondary | ICD-10-CM | POA: Diagnosis not present

## 2017-11-27 ENCOUNTER — Ambulatory Visit
Admission: RE | Admit: 2017-11-27 | Discharge: 2017-11-27 | Disposition: A | Discharge status: Home / Self Care | Payer: Medicare HMO | Source: Ambulatory Visit | Attending: Orthopedic Surgery | Admitting: Orthopedic Surgery

## 2017-11-27 ENCOUNTER — Other Ambulatory Visit: Payer: Self-pay | Admitting: Orthopedic Surgery

## 2017-11-27 DIAGNOSIS — K529 Noninfective gastroenteritis and colitis, unspecified: Secondary | ICD-10-CM | POA: Diagnosis not present

## 2017-11-27 DIAGNOSIS — T148XXA Other injury of unspecified body region, initial encounter: Secondary | ICD-10-CM

## 2017-11-27 DIAGNOSIS — S92002A Unspecified fracture of left calcaneus, initial encounter for closed fracture: Secondary | ICD-10-CM | POA: Diagnosis not present

## 2017-11-27 DIAGNOSIS — Z9884 Bariatric surgery status: Secondary | ICD-10-CM | POA: Diagnosis not present

## 2017-11-27 DIAGNOSIS — M7989 Other specified soft tissue disorders: Secondary | ICD-10-CM | POA: Insufficient documentation

## 2017-11-27 DIAGNOSIS — M79672 Pain in left foot: Secondary | ICD-10-CM

## 2017-11-27 DIAGNOSIS — S92335A Nondisplaced fracture of third metatarsal bone, left foot, initial encounter for closed fracture: Secondary | ICD-10-CM | POA: Diagnosis not present

## 2017-11-27 DIAGNOSIS — W1842XA Slipping, tripping and stumbling without falling due to stepping into hole or opening, initial encounter: Secondary | ICD-10-CM | POA: Diagnosis not present

## 2017-12-08 DIAGNOSIS — S92002A Unspecified fracture of left calcaneus, initial encounter for closed fracture: Secondary | ICD-10-CM | POA: Diagnosis not present

## 2017-12-15 ENCOUNTER — Ambulatory Visit: Payer: Medicare HMO | Admitting: Internal Medicine

## 2017-12-18 DIAGNOSIS — E785 Hyperlipidemia, unspecified: Secondary | ICD-10-CM | POA: Diagnosis not present

## 2017-12-18 DIAGNOSIS — R0789 Other chest pain: Secondary | ICD-10-CM | POA: Diagnosis not present

## 2017-12-18 DIAGNOSIS — I10 Essential (primary) hypertension: Secondary | ICD-10-CM | POA: Diagnosis not present

## 2017-12-18 DIAGNOSIS — E538 Deficiency of other specified B group vitamins: Secondary | ICD-10-CM | POA: Diagnosis not present

## 2018-01-08 ENCOUNTER — Other Ambulatory Visit: Payer: Self-pay | Admitting: Family Medicine

## 2018-01-08 DIAGNOSIS — E782 Mixed hyperlipidemia: Secondary | ICD-10-CM

## 2018-01-09 DIAGNOSIS — R0789 Other chest pain: Secondary | ICD-10-CM | POA: Diagnosis not present

## 2018-01-09 NOTE — Telephone Encounter (Signed)
Refill Request for Cholesterol medication. Atorvastatin to [facility].  Last visit: 06/02/2017  Lab Results  Component Value Date   CHOL 95 11/08/2016   HDL 49 (L) 11/08/2016   LDLCALC 33 11/08/2016   TRIG 65 11/08/2016   CHOLHDL 1.9 11/08/2016   Follow up on 06/05/2018

## 2018-01-17 DIAGNOSIS — Z23 Encounter for immunization: Secondary | ICD-10-CM | POA: Diagnosis not present

## 2018-01-17 DIAGNOSIS — Z0000 Encounter for general adult medical examination without abnormal findings: Secondary | ICD-10-CM | POA: Diagnosis not present

## 2018-01-17 DIAGNOSIS — F411 Generalized anxiety disorder: Secondary | ICD-10-CM | POA: Diagnosis not present

## 2018-01-17 DIAGNOSIS — T8149XA Infection following a procedure, other surgical site, initial encounter: Secondary | ICD-10-CM | POA: Diagnosis not present

## 2018-01-17 DIAGNOSIS — F325 Major depressive disorder, single episode, in full remission: Secondary | ICD-10-CM | POA: Diagnosis not present

## 2018-01-17 DIAGNOSIS — I10 Essential (primary) hypertension: Secondary | ICD-10-CM | POA: Diagnosis not present

## 2018-03-07 DIAGNOSIS — E538 Deficiency of other specified B group vitamins: Secondary | ICD-10-CM | POA: Diagnosis not present

## 2018-03-09 ENCOUNTER — Other Ambulatory Visit: Payer: Self-pay | Admitting: Family Medicine

## 2018-03-09 DIAGNOSIS — F320 Major depressive disorder, single episode, mild: Secondary | ICD-10-CM

## 2018-03-28 DIAGNOSIS — I10 Essential (primary) hypertension: Secondary | ICD-10-CM | POA: Diagnosis not present

## 2018-03-28 DIAGNOSIS — F411 Generalized anxiety disorder: Secondary | ICD-10-CM | POA: Diagnosis not present

## 2018-03-28 DIAGNOSIS — E785 Hyperlipidemia, unspecified: Secondary | ICD-10-CM | POA: Diagnosis not present

## 2018-04-11 DIAGNOSIS — E538 Deficiency of other specified B group vitamins: Secondary | ICD-10-CM | POA: Diagnosis not present

## 2018-04-30 ENCOUNTER — Telehealth: Payer: Self-pay | Admitting: *Deleted

## 2018-04-30 NOTE — Telephone Encounter (Signed)
Patient called to cancel her scheduled 05/04/18 lab/MD appts.  She stated that she would call back at a later date to R/S.

## 2018-05-04 ENCOUNTER — Other Ambulatory Visit: Payer: Medicare HMO

## 2018-05-04 ENCOUNTER — Ambulatory Visit: Payer: Medicare HMO | Admitting: Oncology

## 2018-05-08 DIAGNOSIS — F325 Major depressive disorder, single episode, in full remission: Secondary | ICD-10-CM | POA: Diagnosis not present

## 2018-05-08 DIAGNOSIS — Z9884 Bariatric surgery status: Secondary | ICD-10-CM | POA: Diagnosis not present

## 2018-05-08 DIAGNOSIS — Z0000 Encounter for general adult medical examination without abnormal findings: Secondary | ICD-10-CM | POA: Diagnosis not present

## 2018-05-08 DIAGNOSIS — I10 Essential (primary) hypertension: Secondary | ICD-10-CM | POA: Diagnosis not present

## 2018-05-08 DIAGNOSIS — K911 Postgastric surgery syndromes: Secondary | ICD-10-CM | POA: Diagnosis not present

## 2018-05-08 DIAGNOSIS — R5381 Other malaise: Secondary | ICD-10-CM | POA: Diagnosis not present

## 2018-05-08 DIAGNOSIS — R5383 Other fatigue: Secondary | ICD-10-CM | POA: Diagnosis not present

## 2018-06-05 ENCOUNTER — Ambulatory Visit: Payer: Medicare HMO

## 2018-06-13 IMAGING — MG MM DIGITAL SCREENING BILAT W/ CAD
5 series · 5 of 5 positions shown · non-contrast
Comparison: Previous exam(s).

CLINICAL DATA: Screening.

EXAM:
DIGITAL SCREENING BILATERAL MAMMOGRAM WITH CAD

[R CC]
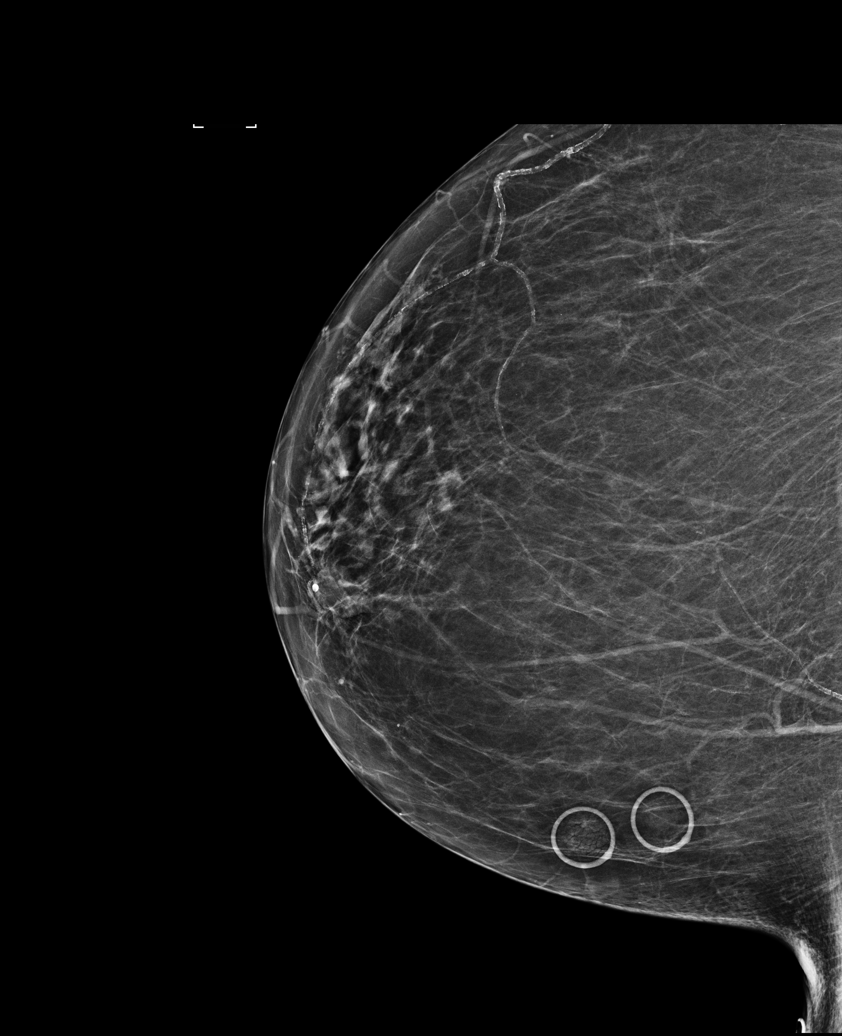

[R XCCL]
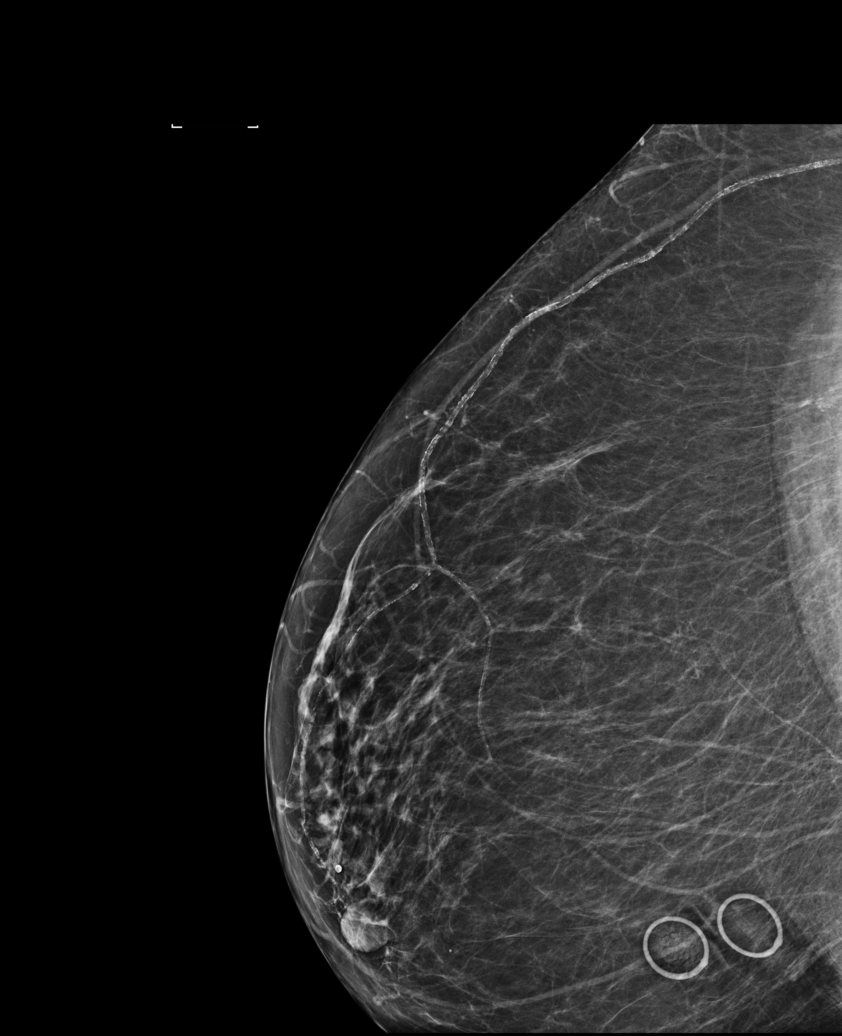

[R MLO]
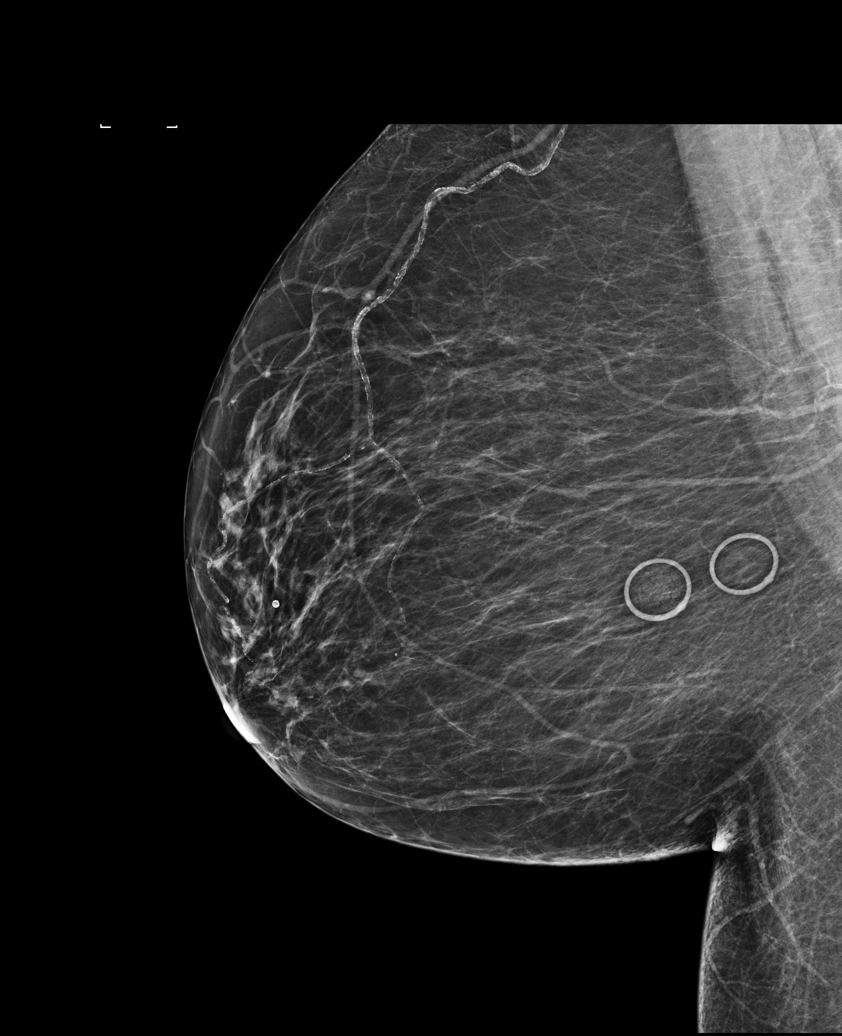

[L CC]
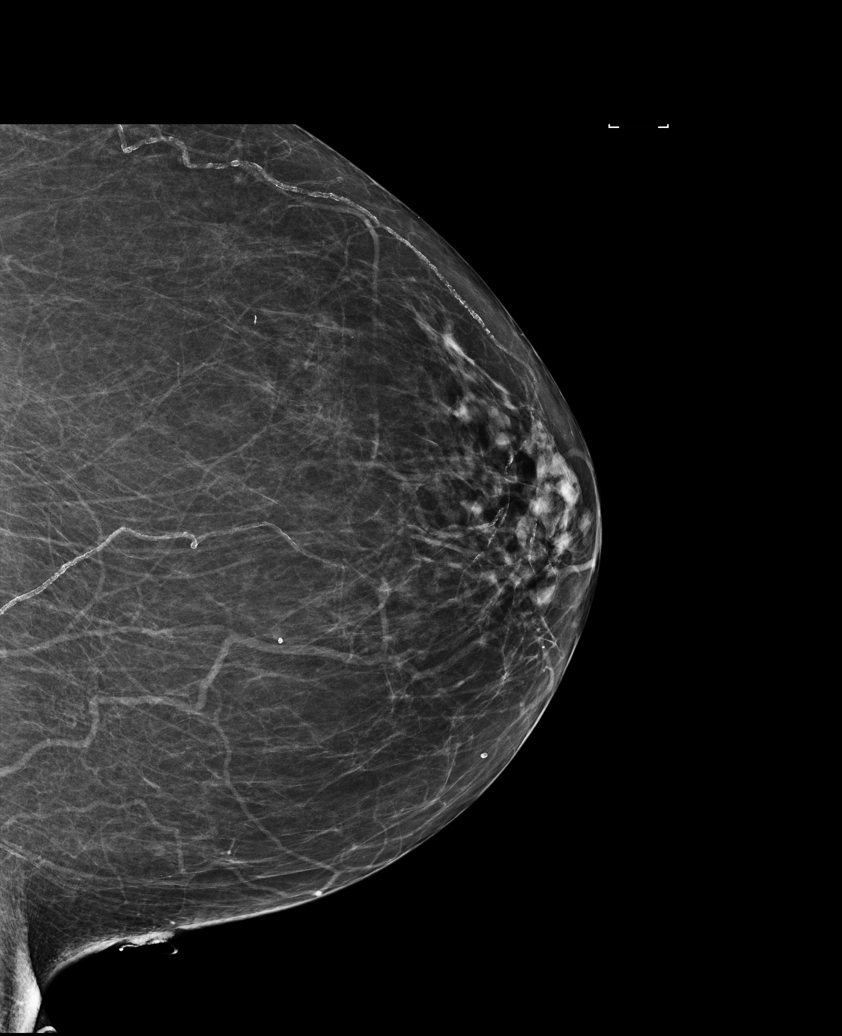

[L MLO]
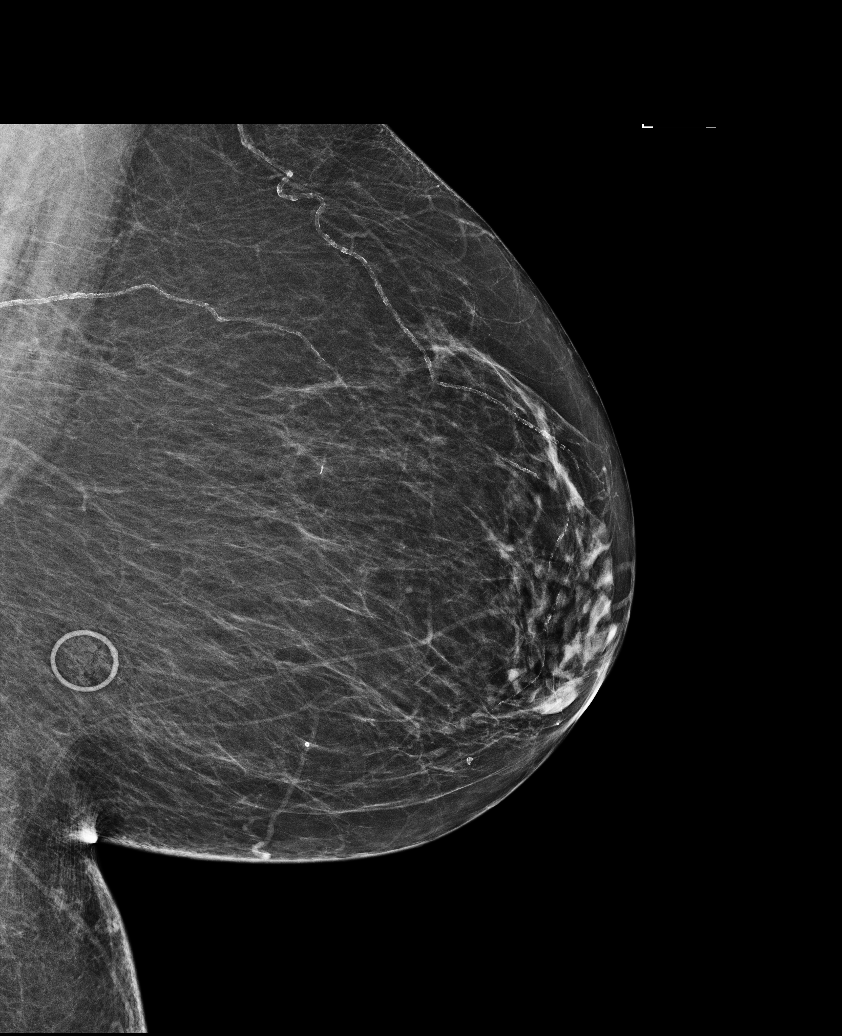

[5 of 5 positions shown; findings below may reference images not displayed]

ACR Breast Density Category b: There are scattered areas of
fibroglandular density.
FINDINGS: There are no findings suspicious for malignancy. Images were
processed with CAD.
IMPRESSION: No mammographic evidence of malignancy. A result letter of this
screening mammogram will be mailed directly to the patient.

RECOMMENDATION:
Screening mammogram in one year. (Code:AS-G-LCT)

BI-RADS CATEGORY  1: Negative.

## 2018-06-28 DIAGNOSIS — E538 Deficiency of other specified B group vitamins: Secondary | ICD-10-CM | POA: Diagnosis not present

## 2018-08-27 DIAGNOSIS — I10 Essential (primary) hypertension: Secondary | ICD-10-CM | POA: Diagnosis not present

## 2018-08-27 DIAGNOSIS — E538 Deficiency of other specified B group vitamins: Secondary | ICD-10-CM | POA: Diagnosis not present

## 2018-10-02 DIAGNOSIS — E538 Deficiency of other specified B group vitamins: Secondary | ICD-10-CM | POA: Diagnosis not present

## 2018-10-30 DIAGNOSIS — F325 Major depressive disorder, single episode, in full remission: Secondary | ICD-10-CM | POA: Diagnosis not present

## 2018-10-30 DIAGNOSIS — I10 Essential (primary) hypertension: Secondary | ICD-10-CM | POA: Diagnosis not present

## 2018-10-30 DIAGNOSIS — Z0000 Encounter for general adult medical examination without abnormal findings: Secondary | ICD-10-CM | POA: Diagnosis not present

## 2018-11-06 DIAGNOSIS — F411 Generalized anxiety disorder: Secondary | ICD-10-CM | POA: Diagnosis not present

## 2018-11-06 DIAGNOSIS — E538 Deficiency of other specified B group vitamins: Secondary | ICD-10-CM | POA: Diagnosis not present

## 2018-11-06 DIAGNOSIS — F3341 Major depressive disorder, recurrent, in partial remission: Secondary | ICD-10-CM | POA: Diagnosis not present

## 2018-11-06 DIAGNOSIS — K911 Postgastric surgery syndromes: Secondary | ICD-10-CM | POA: Diagnosis not present

## 2018-11-06 DIAGNOSIS — E785 Hyperlipidemia, unspecified: Secondary | ICD-10-CM | POA: Diagnosis not present

## 2018-11-06 DIAGNOSIS — Z0000 Encounter for general adult medical examination without abnormal findings: Secondary | ICD-10-CM | POA: Diagnosis not present

## 2018-11-06 DIAGNOSIS — I10 Essential (primary) hypertension: Secondary | ICD-10-CM | POA: Diagnosis not present

## 2018-11-06 DIAGNOSIS — Z9884 Bariatric surgery status: Secondary | ICD-10-CM | POA: Diagnosis not present

## 2018-12-10 DIAGNOSIS — E538 Deficiency of other specified B group vitamins: Secondary | ICD-10-CM | POA: Diagnosis not present

## 2019-01-14 DIAGNOSIS — Z23 Encounter for immunization: Secondary | ICD-10-CM | POA: Diagnosis not present

## 2019-01-14 DIAGNOSIS — E538 Deficiency of other specified B group vitamins: Secondary | ICD-10-CM | POA: Diagnosis not present

## 2019-02-14 DIAGNOSIS — E538 Deficiency of other specified B group vitamins: Secondary | ICD-10-CM | POA: Diagnosis not present

## 2019-02-28 DIAGNOSIS — Z79899 Other long term (current) drug therapy: Secondary | ICD-10-CM | POA: Diagnosis not present

## 2019-02-28 DIAGNOSIS — F411 Generalized anxiety disorder: Secondary | ICD-10-CM | POA: Diagnosis not present

## 2019-02-28 DIAGNOSIS — E785 Hyperlipidemia, unspecified: Secondary | ICD-10-CM | POA: Diagnosis not present

## 2019-02-28 DIAGNOSIS — F329 Major depressive disorder, single episode, unspecified: Secondary | ICD-10-CM | POA: Diagnosis not present

## 2019-02-28 DIAGNOSIS — I10 Essential (primary) hypertension: Secondary | ICD-10-CM | POA: Diagnosis not present

## 2019-02-28 DIAGNOSIS — D649 Anemia, unspecified: Secondary | ICD-10-CM | POA: Diagnosis not present

## 2019-02-28 DIAGNOSIS — L538 Other specified erythematous conditions: Secondary | ICD-10-CM | POA: Diagnosis not present

## 2019-02-28 DIAGNOSIS — L989 Disorder of the skin and subcutaneous tissue, unspecified: Secondary | ICD-10-CM | POA: Diagnosis not present

## 2019-03-19 DIAGNOSIS — E538 Deficiency of other specified B group vitamins: Secondary | ICD-10-CM | POA: Diagnosis not present

## 2019-04-06 DIAGNOSIS — H10023 Other mucopurulent conjunctivitis, bilateral: Secondary | ICD-10-CM | POA: Diagnosis not present

## 2019-04-06 DIAGNOSIS — H40033 Anatomical narrow angle, bilateral: Secondary | ICD-10-CM | POA: Diagnosis not present

## 2019-04-22 DIAGNOSIS — E538 Deficiency of other specified B group vitamins: Secondary | ICD-10-CM | POA: Diagnosis not present

## 2019-05-06 DIAGNOSIS — K911 Postgastric surgery syndromes: Secondary | ICD-10-CM | POA: Diagnosis not present

## 2019-05-06 DIAGNOSIS — E785 Hyperlipidemia, unspecified: Secondary | ICD-10-CM | POA: Diagnosis not present

## 2019-05-06 DIAGNOSIS — Z9884 Bariatric surgery status: Secondary | ICD-10-CM | POA: Diagnosis not present

## 2019-05-06 DIAGNOSIS — I10 Essential (primary) hypertension: Secondary | ICD-10-CM | POA: Diagnosis not present

## 2019-05-13 DIAGNOSIS — I10 Essential (primary) hypertension: Secondary | ICD-10-CM | POA: Diagnosis not present

## 2019-05-13 DIAGNOSIS — D649 Anemia, unspecified: Secondary | ICD-10-CM | POA: Diagnosis not present

## 2019-05-13 DIAGNOSIS — L299 Pruritus, unspecified: Secondary | ICD-10-CM | POA: Diagnosis not present

## 2019-05-13 DIAGNOSIS — R21 Rash and other nonspecific skin eruption: Secondary | ICD-10-CM | POA: Diagnosis not present

## 2019-05-13 DIAGNOSIS — R5383 Other fatigue: Secondary | ICD-10-CM | POA: Diagnosis not present

## 2019-05-13 DIAGNOSIS — L989 Disorder of the skin and subcutaneous tissue, unspecified: Secondary | ICD-10-CM | POA: Diagnosis not present

## 2019-05-13 DIAGNOSIS — Z0000 Encounter for general adult medical examination without abnormal findings: Secondary | ICD-10-CM | POA: Diagnosis not present

## 2019-05-14 DIAGNOSIS — L570 Actinic keratosis: Secondary | ICD-10-CM | POA: Diagnosis not present

## 2019-05-14 DIAGNOSIS — R208 Other disturbances of skin sensation: Secondary | ICD-10-CM | POA: Diagnosis not present

## 2019-05-14 DIAGNOSIS — D225 Melanocytic nevi of trunk: Secondary | ICD-10-CM | POA: Diagnosis not present

## 2019-05-14 DIAGNOSIS — D485 Neoplasm of uncertain behavior of skin: Secondary | ICD-10-CM | POA: Diagnosis not present

## 2019-05-14 DIAGNOSIS — L821 Other seborrheic keratosis: Secondary | ICD-10-CM | POA: Diagnosis not present

## 2019-05-14 DIAGNOSIS — C44519 Basal cell carcinoma of skin of other part of trunk: Secondary | ICD-10-CM | POA: Diagnosis not present

## 2019-05-14 DIAGNOSIS — D1801 Hemangioma of skin and subcutaneous tissue: Secondary | ICD-10-CM | POA: Diagnosis not present

## 2019-05-14 DIAGNOSIS — L814 Other melanin hyperpigmentation: Secondary | ICD-10-CM | POA: Diagnosis not present

## 2019-05-14 DIAGNOSIS — L820 Inflamed seborrheic keratosis: Secondary | ICD-10-CM | POA: Diagnosis not present

## 2019-05-14 DIAGNOSIS — L218 Other seborrheic dermatitis: Secondary | ICD-10-CM | POA: Diagnosis not present

## 2019-05-20 DIAGNOSIS — C44519 Basal cell carcinoma of skin of other part of trunk: Secondary | ICD-10-CM | POA: Diagnosis not present

## 2019-05-20 DIAGNOSIS — L080 Pyoderma: Secondary | ICD-10-CM | POA: Diagnosis not present

## 2019-05-20 DIAGNOSIS — L249 Irritant contact dermatitis, unspecified cause: Secondary | ICD-10-CM | POA: Diagnosis not present

## 2019-05-22 DIAGNOSIS — E538 Deficiency of other specified B group vitamins: Secondary | ICD-10-CM | POA: Diagnosis not present

## 2019-05-23 DIAGNOSIS — Z0100 Encounter for examination of eyes and vision without abnormal findings: Secondary | ICD-10-CM | POA: Diagnosis not present

## 2019-06-26 ENCOUNTER — Other Ambulatory Visit: Payer: Self-pay | Admitting: Internal Medicine

## 2019-06-26 DIAGNOSIS — R0789 Other chest pain: Secondary | ICD-10-CM | POA: Diagnosis not present

## 2019-06-26 DIAGNOSIS — F329 Major depressive disorder, single episode, unspecified: Secondary | ICD-10-CM | POA: Diagnosis not present

## 2019-06-26 DIAGNOSIS — L989 Disorder of the skin and subcutaneous tissue, unspecified: Secondary | ICD-10-CM | POA: Diagnosis not present

## 2019-06-26 DIAGNOSIS — I10 Essential (primary) hypertension: Secondary | ICD-10-CM | POA: Diagnosis not present

## 2019-06-26 DIAGNOSIS — D649 Anemia, unspecified: Secondary | ICD-10-CM | POA: Diagnosis not present

## 2019-06-26 DIAGNOSIS — Z1231 Encounter for screening mammogram for malignant neoplasm of breast: Secondary | ICD-10-CM

## 2019-06-26 DIAGNOSIS — R202 Paresthesia of skin: Secondary | ICD-10-CM | POA: Diagnosis not present

## 2019-06-26 DIAGNOSIS — R5383 Other fatigue: Secondary | ICD-10-CM | POA: Diagnosis not present

## 2019-06-26 DIAGNOSIS — L219 Seborrheic dermatitis, unspecified: Secondary | ICD-10-CM | POA: Diagnosis not present

## 2019-07-01 DIAGNOSIS — Z85828 Personal history of other malignant neoplasm of skin: Secondary | ICD-10-CM | POA: Diagnosis not present

## 2019-07-01 DIAGNOSIS — L905 Scar conditions and fibrosis of skin: Secondary | ICD-10-CM | POA: Diagnosis not present

## 2019-07-31 DIAGNOSIS — D509 Iron deficiency anemia, unspecified: Secondary | ICD-10-CM | POA: Diagnosis not present

## 2019-07-31 DIAGNOSIS — E538 Deficiency of other specified B group vitamins: Secondary | ICD-10-CM | POA: Diagnosis not present

## 2019-09-10 DIAGNOSIS — D509 Iron deficiency anemia, unspecified: Secondary | ICD-10-CM | POA: Diagnosis not present

## 2019-09-10 DIAGNOSIS — E538 Deficiency of other specified B group vitamins: Secondary | ICD-10-CM | POA: Diagnosis not present

## 2019-09-30 ENCOUNTER — Ambulatory Visit
Admission: RE | Admit: 2019-09-30 | Discharge: 2019-09-30 | Disposition: A | Discharge status: Home / Self Care | Payer: Medicare HMO | Source: Ambulatory Visit | Attending: Internal Medicine | Admitting: Internal Medicine

## 2019-09-30 DIAGNOSIS — Z1231 Encounter for screening mammogram for malignant neoplasm of breast: Secondary | ICD-10-CM | POA: Diagnosis not present

## 2019-10-11 DIAGNOSIS — D509 Iron deficiency anemia, unspecified: Secondary | ICD-10-CM | POA: Diagnosis not present

## 2019-10-11 DIAGNOSIS — E538 Deficiency of other specified B group vitamins: Secondary | ICD-10-CM | POA: Diagnosis not present

## 2019-11-05 DIAGNOSIS — K911 Postgastric surgery syndromes: Secondary | ICD-10-CM | POA: Diagnosis not present

## 2019-11-05 DIAGNOSIS — I10 Essential (primary) hypertension: Secondary | ICD-10-CM | POA: Diagnosis not present

## 2019-11-05 DIAGNOSIS — E785 Hyperlipidemia, unspecified: Secondary | ICD-10-CM | POA: Diagnosis not present

## 2019-11-12 DIAGNOSIS — R5383 Other fatigue: Secondary | ICD-10-CM | POA: Diagnosis not present

## 2019-11-12 DIAGNOSIS — E538 Deficiency of other specified B group vitamins: Secondary | ICD-10-CM | POA: Diagnosis not present

## 2019-11-12 DIAGNOSIS — R5381 Other malaise: Secondary | ICD-10-CM | POA: Insufficient documentation

## 2019-11-12 DIAGNOSIS — F3341 Major depressive disorder, recurrent, in partial remission: Secondary | ICD-10-CM | POA: Diagnosis not present

## 2019-11-12 DIAGNOSIS — Z9884 Bariatric surgery status: Secondary | ICD-10-CM | POA: Diagnosis not present

## 2019-11-12 DIAGNOSIS — D649 Anemia, unspecified: Secondary | ICD-10-CM | POA: Diagnosis not present

## 2019-11-12 DIAGNOSIS — D509 Iron deficiency anemia, unspecified: Secondary | ICD-10-CM | POA: Diagnosis not present

## 2019-11-12 DIAGNOSIS — Z8249 Family history of ischemic heart disease and other diseases of the circulatory system: Secondary | ICD-10-CM | POA: Insufficient documentation

## 2019-11-12 DIAGNOSIS — R0789 Other chest pain: Secondary | ICD-10-CM | POA: Diagnosis not present

## 2019-11-12 DIAGNOSIS — I10 Essential (primary) hypertension: Secondary | ICD-10-CM | POA: Diagnosis not present

## 2019-12-03 DIAGNOSIS — E785 Hyperlipidemia, unspecified: Secondary | ICD-10-CM | POA: Diagnosis not present

## 2019-12-03 DIAGNOSIS — I10 Essential (primary) hypertension: Secondary | ICD-10-CM | POA: Diagnosis not present

## 2019-12-03 DIAGNOSIS — R079 Chest pain, unspecified: Secondary | ICD-10-CM | POA: Diagnosis not present

## 2019-12-03 DIAGNOSIS — Z8249 Family history of ischemic heart disease and other diseases of the circulatory system: Secondary | ICD-10-CM | POA: Diagnosis not present

## 2019-12-16 DIAGNOSIS — E538 Deficiency of other specified B group vitamins: Secondary | ICD-10-CM | POA: Diagnosis not present

## 2019-12-16 DIAGNOSIS — D509 Iron deficiency anemia, unspecified: Secondary | ICD-10-CM | POA: Diagnosis not present

## 2020-01-17 DIAGNOSIS — E538 Deficiency of other specified B group vitamins: Secondary | ICD-10-CM | POA: Diagnosis not present

## 2020-01-17 DIAGNOSIS — D509 Iron deficiency anemia, unspecified: Secondary | ICD-10-CM | POA: Diagnosis not present

## 2020-01-17 DIAGNOSIS — E539 Vitamin B deficiency, unspecified: Secondary | ICD-10-CM | POA: Diagnosis not present

## 2020-02-04 DIAGNOSIS — Z23 Encounter for immunization: Secondary | ICD-10-CM | POA: Diagnosis not present

## 2020-03-09 DIAGNOSIS — F3341 Major depressive disorder, recurrent, in partial remission: Secondary | ICD-10-CM | POA: Diagnosis not present

## 2020-03-09 DIAGNOSIS — Z9884 Bariatric surgery status: Secondary | ICD-10-CM | POA: Diagnosis not present

## 2020-03-09 DIAGNOSIS — R0789 Other chest pain: Secondary | ICD-10-CM | POA: Diagnosis not present

## 2020-03-09 DIAGNOSIS — I10 Essential (primary) hypertension: Secondary | ICD-10-CM | POA: Diagnosis not present

## 2020-03-09 DIAGNOSIS — D649 Anemia, unspecified: Secondary | ICD-10-CM | POA: Diagnosis not present

## 2020-03-09 DIAGNOSIS — R5381 Other malaise: Secondary | ICD-10-CM | POA: Diagnosis not present

## 2020-03-09 DIAGNOSIS — Z8249 Family history of ischemic heart disease and other diseases of the circulatory system: Secondary | ICD-10-CM | POA: Diagnosis not present

## 2020-03-09 DIAGNOSIS — E785 Hyperlipidemia, unspecified: Secondary | ICD-10-CM | POA: Diagnosis not present

## 2020-03-09 DIAGNOSIS — R5383 Other fatigue: Secondary | ICD-10-CM | POA: Diagnosis not present

## 2020-03-16 DIAGNOSIS — D649 Anemia, unspecified: Secondary | ICD-10-CM | POA: Diagnosis not present

## 2020-03-16 DIAGNOSIS — Z8639 Personal history of other endocrine, nutritional and metabolic disease: Secondary | ICD-10-CM | POA: Diagnosis not present

## 2020-03-16 DIAGNOSIS — F3341 Major depressive disorder, recurrent, in partial remission: Secondary | ICD-10-CM | POA: Insufficient documentation

## 2020-03-16 DIAGNOSIS — F419 Anxiety disorder, unspecified: Secondary | ICD-10-CM | POA: Diagnosis not present

## 2020-03-16 DIAGNOSIS — I10 Essential (primary) hypertension: Secondary | ICD-10-CM | POA: Diagnosis not present

## 2020-03-16 DIAGNOSIS — Z9884 Bariatric surgery status: Secondary | ICD-10-CM | POA: Diagnosis not present

## 2020-03-16 DIAGNOSIS — R413 Other amnesia: Secondary | ICD-10-CM | POA: Diagnosis not present

## 2020-03-16 DIAGNOSIS — F324 Major depressive disorder, single episode, in partial remission: Secondary | ICD-10-CM | POA: Diagnosis not present

## 2020-03-30 DIAGNOSIS — H10023 Other mucopurulent conjunctivitis, bilateral: Secondary | ICD-10-CM | POA: Diagnosis not present

## 2020-03-31 DIAGNOSIS — H04123 Dry eye syndrome of bilateral lacrimal glands: Secondary | ICD-10-CM | POA: Diagnosis not present

## 2020-05-21 DIAGNOSIS — Z20822 Contact with and (suspected) exposure to covid-19: Secondary | ICD-10-CM | POA: Diagnosis not present

## 2020-05-21 DIAGNOSIS — U071 COVID-19: Secondary | ICD-10-CM | POA: Diagnosis not present

## 2020-07-09 DIAGNOSIS — M8588 Other specified disorders of bone density and structure, other site: Secondary | ICD-10-CM | POA: Diagnosis not present

## 2020-07-09 DIAGNOSIS — I10 Essential (primary) hypertension: Secondary | ICD-10-CM | POA: Diagnosis not present

## 2020-07-09 DIAGNOSIS — E785 Hyperlipidemia, unspecified: Secondary | ICD-10-CM | POA: Diagnosis not present

## 2020-07-09 DIAGNOSIS — F3341 Major depressive disorder, recurrent, in partial remission: Secondary | ICD-10-CM | POA: Diagnosis not present

## 2020-07-09 DIAGNOSIS — Z9884 Bariatric surgery status: Secondary | ICD-10-CM | POA: Diagnosis not present

## 2020-07-09 DIAGNOSIS — R413 Other amnesia: Secondary | ICD-10-CM | POA: Diagnosis not present

## 2020-07-09 DIAGNOSIS — D649 Anemia, unspecified: Secondary | ICD-10-CM | POA: Diagnosis not present

## 2020-07-16 DIAGNOSIS — D649 Anemia, unspecified: Secondary | ICD-10-CM | POA: Diagnosis not present

## 2020-07-16 DIAGNOSIS — Z0000 Encounter for general adult medical examination without abnormal findings: Secondary | ICD-10-CM | POA: Diagnosis not present

## 2020-07-16 DIAGNOSIS — Z79899 Other long term (current) drug therapy: Secondary | ICD-10-CM | POA: Diagnosis not present

## 2020-07-16 DIAGNOSIS — F324 Major depressive disorder, single episode, in partial remission: Secondary | ICD-10-CM | POA: Diagnosis not present

## 2020-07-16 DIAGNOSIS — I10 Essential (primary) hypertension: Secondary | ICD-10-CM | POA: Diagnosis not present

## 2020-07-16 DIAGNOSIS — E785 Hyperlipidemia, unspecified: Secondary | ICD-10-CM | POA: Diagnosis not present

## 2020-07-23 DIAGNOSIS — L820 Inflamed seborrheic keratosis: Secondary | ICD-10-CM | POA: Diagnosis not present

## 2020-07-23 DIAGNOSIS — L298 Other pruritus: Secondary | ICD-10-CM | POA: Diagnosis not present

## 2020-07-23 DIAGNOSIS — Z85828 Personal history of other malignant neoplasm of skin: Secondary | ICD-10-CM | POA: Diagnosis not present

## 2020-07-23 DIAGNOSIS — L905 Scar conditions and fibrosis of skin: Secondary | ICD-10-CM | POA: Diagnosis not present

## 2020-07-23 DIAGNOSIS — L814 Other melanin hyperpigmentation: Secondary | ICD-10-CM | POA: Diagnosis not present

## 2020-07-23 DIAGNOSIS — D225 Melanocytic nevi of trunk: Secondary | ICD-10-CM | POA: Diagnosis not present

## 2020-09-01 DIAGNOSIS — E538 Deficiency of other specified B group vitamins: Secondary | ICD-10-CM | POA: Diagnosis not present

## 2020-09-24 DIAGNOSIS — H938X3 Other specified disorders of ear, bilateral: Secondary | ICD-10-CM | POA: Diagnosis not present

## 2020-09-24 DIAGNOSIS — I10 Essential (primary) hypertension: Secondary | ICD-10-CM | POA: Diagnosis not present

## 2020-10-13 DIAGNOSIS — I10 Essential (primary) hypertension: Secondary | ICD-10-CM | POA: Diagnosis not present

## 2020-10-13 DIAGNOSIS — R1010 Upper abdominal pain, unspecified: Secondary | ICD-10-CM | POA: Diagnosis not present

## 2020-10-13 DIAGNOSIS — F3341 Major depressive disorder, recurrent, in partial remission: Secondary | ICD-10-CM | POA: Diagnosis not present

## 2020-10-13 DIAGNOSIS — Z8639 Personal history of other endocrine, nutritional and metabolic disease: Secondary | ICD-10-CM | POA: Diagnosis not present

## 2020-10-13 DIAGNOSIS — Z9884 Bariatric surgery status: Secondary | ICD-10-CM | POA: Diagnosis not present

## 2020-10-14 ENCOUNTER — Emergency Department (HOSPITAL_COMMUNITY): Payer: Medicare HMO

## 2020-10-14 ENCOUNTER — Other Ambulatory Visit: Payer: Self-pay

## 2020-10-14 ENCOUNTER — Inpatient Hospital Stay (HOSPITAL_COMMUNITY)
Admission: EM | Admit: 2020-10-14 | Discharge: 2020-10-17 | DRG: 438 | Disposition: A | Discharge status: Home / Self Care | Payer: Medicare HMO | Attending: Hospitalist | Admitting: Hospitalist

## 2020-10-14 DIAGNOSIS — R1084 Generalized abdominal pain: Secondary | ICD-10-CM | POA: Diagnosis not present

## 2020-10-14 DIAGNOSIS — F39 Unspecified mood [affective] disorder: Secondary | ICD-10-CM | POA: Diagnosis present

## 2020-10-14 DIAGNOSIS — E538 Deficiency of other specified B group vitamins: Secondary | ICD-10-CM | POA: Diagnosis present

## 2020-10-14 DIAGNOSIS — R110 Nausea: Secondary | ICD-10-CM | POA: Diagnosis not present

## 2020-10-14 DIAGNOSIS — U071 COVID-19: Secondary | ICD-10-CM | POA: Diagnosis present

## 2020-10-14 DIAGNOSIS — R112 Nausea with vomiting, unspecified: Secondary | ICD-10-CM | POA: Diagnosis not present

## 2020-10-14 DIAGNOSIS — I959 Hypotension, unspecified: Secondary | ICD-10-CM | POA: Diagnosis not present

## 2020-10-14 DIAGNOSIS — I10 Essential (primary) hypertension: Secondary | ICD-10-CM | POA: Diagnosis present

## 2020-10-14 DIAGNOSIS — R17 Unspecified jaundice: Secondary | ICD-10-CM

## 2020-10-14 DIAGNOSIS — Z8249 Family history of ischemic heart disease and other diseases of the circulatory system: Secondary | ICD-10-CM | POA: Diagnosis not present

## 2020-10-14 DIAGNOSIS — R8290 Unspecified abnormal findings in urine: Secondary | ICD-10-CM | POA: Diagnosis not present

## 2020-10-14 DIAGNOSIS — Z79899 Other long term (current) drug therapy: Secondary | ICD-10-CM

## 2020-10-14 DIAGNOSIS — K8070 Calculus of gallbladder and bile duct without cholecystitis without obstruction: Secondary | ICD-10-CM | POA: Diagnosis present

## 2020-10-14 DIAGNOSIS — E876 Hypokalemia: Secondary | ICD-10-CM | POA: Diagnosis present

## 2020-10-14 DIAGNOSIS — K859 Acute pancreatitis without necrosis or infection, unspecified: Secondary | ICD-10-CM | POA: Diagnosis not present

## 2020-10-14 DIAGNOSIS — E669 Obesity, unspecified: Secondary | ICD-10-CM | POA: Diagnosis present

## 2020-10-14 DIAGNOSIS — E559 Vitamin D deficiency, unspecified: Secondary | ICD-10-CM | POA: Diagnosis present

## 2020-10-14 DIAGNOSIS — K55069 Acute infarction of intestine, part and extent unspecified: Secondary | ICD-10-CM | POA: Diagnosis not present

## 2020-10-14 DIAGNOSIS — Z9884 Bariatric surgery status: Secondary | ICD-10-CM

## 2020-10-14 DIAGNOSIS — R1011 Right upper quadrant pain: Secondary | ICD-10-CM | POA: Diagnosis present

## 2020-10-14 DIAGNOSIS — F419 Anxiety disorder, unspecified: Secondary | ICD-10-CM | POA: Diagnosis present

## 2020-10-14 DIAGNOSIS — R7401 Elevation of levels of liver transaminase levels: Secondary | ICD-10-CM

## 2020-10-14 DIAGNOSIS — R399 Unspecified symptoms and signs involving the genitourinary system: Secondary | ICD-10-CM | POA: Diagnosis not present

## 2020-10-14 DIAGNOSIS — R000 Tachycardia, unspecified: Secondary | ICD-10-CM | POA: Diagnosis not present

## 2020-10-14 DIAGNOSIS — K851 Biliary acute pancreatitis without necrosis or infection: Principal | ICD-10-CM | POA: Diagnosis present

## 2020-10-14 DIAGNOSIS — Z90710 Acquired absence of both cervix and uterus: Secondary | ICD-10-CM | POA: Diagnosis not present

## 2020-10-14 DIAGNOSIS — R509 Fever, unspecified: Secondary | ICD-10-CM | POA: Diagnosis not present

## 2020-10-14 DIAGNOSIS — R319 Hematuria, unspecified: Secondary | ICD-10-CM | POA: Diagnosis not present

## 2020-10-14 DIAGNOSIS — Z6830 Body mass index (BMI) 30.0-30.9, adult: Secondary | ICD-10-CM

## 2020-10-14 DIAGNOSIS — K828 Other specified diseases of gallbladder: Secondary | ICD-10-CM | POA: Diagnosis not present

## 2020-10-14 DIAGNOSIS — R6883 Chills (without fever): Secondary | ICD-10-CM | POA: Diagnosis not present

## 2020-10-14 DIAGNOSIS — Z7982 Long term (current) use of aspirin: Secondary | ICD-10-CM

## 2020-10-14 DIAGNOSIS — K810 Acute cholecystitis: Secondary | ICD-10-CM | POA: Diagnosis present

## 2020-10-14 DIAGNOSIS — E7800 Pure hypercholesterolemia, unspecified: Secondary | ICD-10-CM | POA: Diagnosis present

## 2020-10-14 DIAGNOSIS — Z0389 Encounter for observation for other suspected diseases and conditions ruled out: Secondary | ICD-10-CM | POA: Diagnosis not present

## 2020-10-14 DIAGNOSIS — K829 Disease of gallbladder, unspecified: Secondary | ICD-10-CM | POA: Diagnosis not present

## 2020-10-14 DIAGNOSIS — R52 Pain, unspecified: Secondary | ICD-10-CM

## 2020-10-14 DIAGNOSIS — M545 Low back pain, unspecified: Secondary | ICD-10-CM | POA: Diagnosis not present

## 2020-10-14 DIAGNOSIS — K8020 Calculus of gallbladder without cholecystitis without obstruction: Secondary | ICD-10-CM | POA: Diagnosis not present

## 2020-10-14 DIAGNOSIS — E785 Hyperlipidemia, unspecified: Secondary | ICD-10-CM | POA: Diagnosis present

## 2020-10-14 LAB — CBC WITH DIFFERENTIAL/PLATELET
Abs Immature Granulocytes: 0.1 10*3/uL — ABNORMAL HIGH (ref 0.00–0.07)
Basophils Absolute: 0 10*3/uL (ref 0.0–0.1)
Basophils Relative: 0 %
Eosinophils Absolute: 0 10*3/uL (ref 0.0–0.5)
Eosinophils Relative: 0 %
HCT: 38.4 % (ref 36.0–46.0)
Hemoglobin: 12.3 g/dL (ref 12.0–15.0)
Immature Granulocytes: 1 %
Lymphocytes Relative: 3 %
Lymphs Abs: 0.5 10*3/uL — ABNORMAL LOW (ref 0.7–4.0)
MCH: 29.8 pg (ref 26.0–34.0)
MCHC: 32 g/dL (ref 30.0–36.0)
MCV: 93 fL (ref 80.0–100.0)
Monocytes Absolute: 0.9 10*3/uL (ref 0.1–1.0)
Monocytes Relative: 6 %
Neutro Abs: 14.2 10*3/uL — ABNORMAL HIGH (ref 1.7–7.7)
Neutrophils Relative %: 90 %
Platelets: 240 10*3/uL (ref 150–400)
RBC: 4.13 MIL/uL (ref 3.87–5.11)
RDW: 13.2 % (ref 11.5–15.5)
WBC: 15.7 10*3/uL — ABNORMAL HIGH (ref 4.0–10.5)
nRBC: 0 % (ref 0.0–0.2)

## 2020-10-14 MED ORDER — ONDANSETRON HCL 4 MG/2ML IJ SOLN
4.0000 mg | Freq: Once | INTRAMUSCULAR | Status: AC
  Administered 2020-10-14: 4 mg via INTRAVENOUS
  Filled 2020-10-14: qty 2

## 2020-10-14 MED ORDER — CEPHALEXIN 250 MG PO CAPS
500.0000 mg | ORAL_CAPSULE | Freq: Once | ORAL | Status: AC
  Administered 2020-10-14: 500 mg via ORAL
  Filled 2020-10-14: qty 2

## 2020-10-14 NOTE — ED Triage Notes (Signed)
Patient arrived with EMS from home reports LLQ abdominal and left lower back pain onset last week , diagnosed with UTI by PCP with no prescription .

## 2020-10-14 NOTE — ED Provider Notes (Signed)
MSE was initiated and I personally evaluated the patient and placed orders (if any) at  10:42 PM on October 14, 2020.  To ED with pain across upper abdomen around to the back starting 2 days ago. Yesterday with vomiting and chills. Seen by PCP (UA available in Care Everywhere, +nitrites). Had negative COVID/FLU test yesterday. Here with worsening symptoms. Also has h/o SBO.  Today's Vitals   10/14/20 2037 10/14/20 2039 10/14/20 2235  BP: (!) 141/70    Pulse: (!) 101    Resp: 16    Temp:  98.1 F (36.7 C)   TempSrc:  Oral   SpO2: 93%    Weight:   85 kg  Height:   5\' 6"  (1.676 m)  PainSc:   8    Body mass index is 30.25 kg/m.  Appears ill but nontoxic Slightly tachycardic Abdomen soft, mildly tender in upper quadrants No CVA tenderness.   From triage - IV Zofran, PO Keflex, CT ordered, labs pending  The patient appears stable so that the remainder of the MSE may be completed by another provider.   Charlann Lange, PA-C 10/14/20 2245    Wyvonnia Dusky, MD 10/14/20 [phone]

## 2020-10-15 ENCOUNTER — Inpatient Hospital Stay (HOSPITAL_COMMUNITY): Payer: Medicare HMO

## 2020-10-15 ENCOUNTER — Emergency Department (HOSPITAL_COMMUNITY): Payer: Medicare HMO

## 2020-10-15 ENCOUNTER — Encounter (HOSPITAL_COMMUNITY): Payer: Self-pay | Admitting: Internal Medicine

## 2020-10-15 DIAGNOSIS — U071 COVID-19: Secondary | ICD-10-CM | POA: Diagnosis present

## 2020-10-15 DIAGNOSIS — E876 Hypokalemia: Secondary | ICD-10-CM | POA: Diagnosis present

## 2020-10-15 DIAGNOSIS — Z8249 Family history of ischemic heart disease and other diseases of the circulatory system: Secondary | ICD-10-CM | POA: Diagnosis not present

## 2020-10-15 DIAGNOSIS — E559 Vitamin D deficiency, unspecified: Secondary | ICD-10-CM | POA: Diagnosis present

## 2020-10-15 DIAGNOSIS — Z9884 Bariatric surgery status: Secondary | ICD-10-CM | POA: Diagnosis not present

## 2020-10-15 DIAGNOSIS — E7800 Pure hypercholesterolemia, unspecified: Secondary | ICD-10-CM | POA: Diagnosis present

## 2020-10-15 DIAGNOSIS — I10 Essential (primary) hypertension: Secondary | ICD-10-CM | POA: Diagnosis present

## 2020-10-15 DIAGNOSIS — Z6830 Body mass index (BMI) 30.0-30.9, adult: Secondary | ICD-10-CM | POA: Diagnosis not present

## 2020-10-15 DIAGNOSIS — Z7982 Long term (current) use of aspirin: Secondary | ICD-10-CM | POA: Diagnosis not present

## 2020-10-15 DIAGNOSIS — K851 Biliary acute pancreatitis without necrosis or infection: Secondary | ICD-10-CM | POA: Diagnosis present

## 2020-10-15 DIAGNOSIS — F419 Anxiety disorder, unspecified: Secondary | ICD-10-CM | POA: Diagnosis present

## 2020-10-15 DIAGNOSIS — K810 Acute cholecystitis: Secondary | ICD-10-CM | POA: Diagnosis present

## 2020-10-15 DIAGNOSIS — F39 Unspecified mood [affective] disorder: Secondary | ICD-10-CM | POA: Diagnosis present

## 2020-10-15 DIAGNOSIS — E669 Obesity, unspecified: Secondary | ICD-10-CM | POA: Diagnosis present

## 2020-10-15 DIAGNOSIS — K8070 Calculus of gallbladder and bile duct without cholecystitis without obstruction: Secondary | ICD-10-CM | POA: Diagnosis present

## 2020-10-15 DIAGNOSIS — R1011 Right upper quadrant pain: Secondary | ICD-10-CM | POA: Diagnosis present

## 2020-10-15 DIAGNOSIS — E538 Deficiency of other specified B group vitamins: Secondary | ICD-10-CM | POA: Diagnosis present

## 2020-10-15 DIAGNOSIS — Z79899 Other long term (current) drug therapy: Secondary | ICD-10-CM | POA: Diagnosis not present

## 2020-10-15 DIAGNOSIS — Z90710 Acquired absence of both cervix and uterus: Secondary | ICD-10-CM | POA: Diagnosis not present

## 2020-10-15 LAB — CBC WITH DIFFERENTIAL/PLATELET
Abs Immature Granulocytes: 0.11 10*3/uL — ABNORMAL HIGH (ref 0.00–0.07)
Basophils Absolute: 0 10*3/uL (ref 0.0–0.1)
Basophils Relative: 0 %
Eosinophils Absolute: 0 10*3/uL (ref 0.0–0.5)
Eosinophils Relative: 0 %
HCT: 33.1 % — ABNORMAL LOW (ref 36.0–46.0)
Hemoglobin: 10.7 g/dL — ABNORMAL LOW (ref 12.0–15.0)
Immature Granulocytes: 1 %
Lymphocytes Relative: 11 %
Lymphs Abs: 1.7 10*3/uL (ref 0.7–4.0)
MCH: 29.8 pg (ref 26.0–34.0)
MCHC: 32.3 g/dL (ref 30.0–36.0)
MCV: 92.2 fL (ref 80.0–100.0)
Monocytes Absolute: 1 10*3/uL (ref 0.1–1.0)
Monocytes Relative: 7 %
Neutro Abs: 12 10*3/uL — ABNORMAL HIGH (ref 1.7–7.7)
Neutrophils Relative %: 81 %
Platelets: 185 10*3/uL (ref 150–400)
RBC: 3.59 MIL/uL — ABNORMAL LOW (ref 3.87–5.11)
RDW: 13.3 % (ref 11.5–15.5)
WBC: 14.8 10*3/uL — ABNORMAL HIGH (ref 4.0–10.5)
nRBC: 0 % (ref 0.0–0.2)

## 2020-10-15 LAB — COMPREHENSIVE METABOLIC PANEL
ALT: 196 U/L — ABNORMAL HIGH (ref 0–44)
ALT: 149 U/L — ABNORMAL HIGH (ref 0–44)
AST: 156 U/L — ABNORMAL HIGH (ref 15–41)
AST: 111 U/L — ABNORMAL HIGH (ref 15–41)
Albumin: 3.4 g/dL — ABNORMAL LOW (ref 3.5–5.0)
Albumin: 2.8 g/dL — ABNORMAL LOW (ref 3.5–5.0)
Alkaline Phosphatase: 120 U/L (ref 38–126)
Alkaline Phosphatase: 100 U/L (ref 38–126)
Anion gap: 8 (ref 5–15)
Anion gap: 8 (ref 5–15)
BUN: 7 mg/dL — ABNORMAL LOW (ref 8–23)
BUN: 8 mg/dL (ref 8–23)
CO2: 25 mmol/L (ref 22–32)
CO2: 25 mmol/L (ref 22–32)
Calcium: 9 mg/dL (ref 8.9–10.3)
Calcium: 8.4 mg/dL — ABNORMAL LOW (ref 8.9–10.3)
Chloride: 105 mmol/L (ref 98–111)
Chloride: 104 mmol/L (ref 98–111)
Creatinine, Ser: 1.03 mg/dL — ABNORMAL HIGH (ref 0.44–1.00)
Creatinine, Ser: 0.89 mg/dL (ref 0.44–1.00)
GFR, Estimated: 58 mL/min — ABNORMAL LOW (ref >60)
GFR, Estimated: >60 mL/min (ref >60)
Glucose, Bld: 125 mg/dL — ABNORMAL HIGH (ref 70–99)
Glucose, Bld: 103 mg/dL — ABNORMAL HIGH (ref 70–99)
Potassium: 3.1 mmol/L — ABNORMAL LOW (ref 3.5–5.1)
Potassium: 4.2 mmol/L (ref 3.5–5.1)
Sodium: 138 mmol/L (ref 135–145)
Sodium: 137 mmol/L (ref 135–145)
Total Bilirubin: 6.9 mg/dL — ABNORMAL HIGH (ref 0.3–1.2)
Total Bilirubin: 4.2 mg/dL — ABNORMAL HIGH (ref 0.3–1.2)
Total Protein: 6.2 g/dL — ABNORMAL LOW (ref 6.5–8.1)
Total Protein: 5.5 g/dL — ABNORMAL LOW (ref 6.5–8.1)

## 2020-10-15 LAB — URINALYSIS, ROUTINE W REFLEX MICROSCOPIC
Bacteria, UA: NONE SEEN
Bilirubin Urine: NEGATIVE
Glucose, UA: NEGATIVE mg/dL
Ketones, ur: NEGATIVE mg/dL
Leukocytes,Ua: NEGATIVE
Nitrite: NEGATIVE
Protein, ur: NEGATIVE mg/dL
Specific Gravity, Urine: 1.005 (ref 1.005–1.030)
pH: 6 (ref 5.0–8.0)

## 2020-10-15 LAB — D-DIMER, QUANTITATIVE: D-Dimer, Quant: 3.39 ug/mL-FEU — ABNORMAL HIGH (ref 0.00–0.50)

## 2020-10-15 LAB — MAGNESIUM: Magnesium: 1.6 mg/dL — ABNORMAL LOW (ref 1.7–2.4)

## 2020-10-15 LAB — C-REACTIVE PROTEIN: CRP: 10.5 mg/dL — ABNORMAL HIGH (ref <1.0)

## 2020-10-15 LAB — FIBRINOGEN: Fibrinogen: 518 mg/dL — ABNORMAL HIGH (ref 210–475)

## 2020-10-15 LAB — RESP PANEL BY RT-PCR (FLU A&B, COVID) ARPGX2
Influenza A by PCR: NEGATIVE
Influenza B by PCR: NEGATIVE
SARS Coronavirus 2 by RT PCR: POSITIVE — AB

## 2020-10-15 LAB — LIPASE, BLOOD: Lipase: 5572 U/L — ABNORMAL HIGH (ref 11–51)

## 2020-10-15 LAB — FERRITIN: Ferritin: 112 ng/mL (ref 11–307)

## 2020-10-15 LAB — LACTATE DEHYDROGENASE: LDH: 151 U/L (ref 98–192)

## 2020-10-15 LAB — PROCALCITONIN: Procalcitonin: 7.32 ng/mL

## 2020-10-15 LAB — PHOSPHORUS: Phosphorus: 3.5 mg/dL (ref 2.5–4.6)

## 2020-10-15 LAB — LACTIC ACID, PLASMA: Lactic Acid, Venous: 1.8 mmol/L (ref 0.5–1.9)

## 2020-10-15 MED ORDER — SODIUM CHLORIDE 0.9 % IV SOLN
100.0000 mg | Freq: Every day | INTRAVENOUS | Status: AC
  Administered 2020-10-16 – 2020-10-17 (×2): 100 mg via INTRAVENOUS
  Filled 2020-10-15: qty 20
  Filled 2020-10-15: qty 100
  Filled 2020-10-15: qty 20

## 2020-10-15 MED ORDER — POTASSIUM CHLORIDE 10 MEQ/100ML IV SOLN
10.0000 meq | Freq: Once | INTRAVENOUS | Status: AC
  Administered 2020-10-15: 10 meq via INTRAVENOUS
  Filled 2020-10-15: qty 100

## 2020-10-15 MED ORDER — MAGNESIUM SULFATE 4 GM/100ML IV SOLN
4.0000 g | Freq: Once | INTRAVENOUS | Status: AC
  Administered 2020-10-15: 4 g via INTRAVENOUS
  Filled 2020-10-15: qty 100

## 2020-10-15 MED ORDER — METRONIDAZOLE 500 MG/100ML IV SOLN
500.0000 mg | Freq: Once | INTRAVENOUS | Status: AC
  Administered 2020-10-15: 500 mg via INTRAVENOUS
  Filled 2020-10-15: qty 100

## 2020-10-15 MED ORDER — MORPHINE SULFATE (PF) 2 MG/ML IV SOLN: 2.0000 mg | INTRAVENOUS | Status: DC | PRN

## 2020-10-15 MED ORDER — ONDANSETRON HCL 4 MG/2ML IJ SOLN: 4.0000 mg | Freq: Four times a day (QID) | INTRAMUSCULAR | Status: DC | PRN

## 2020-10-15 MED ORDER — LACTATED RINGERS IV SOLN: INTRAVENOUS | Status: DC

## 2020-10-15 MED ORDER — MORPHINE SULFATE (PF) 4 MG/ML IV SOLN
4.0000 mg | Freq: Once | INTRAVENOUS | Status: AC
  Administered 2020-10-15: 4 mg via INTRAVENOUS
  Filled 2020-10-15: qty 1

## 2020-10-15 MED ORDER — ATENOLOL 25 MG PO TABS
25.0000 mg | ORAL_TABLET | Freq: Every day | ORAL | Status: DC
  Administered 2020-10-15 – 2020-10-17 (×3): 25 mg via ORAL
  Filled 2020-10-15 (×3): qty 1

## 2020-10-15 MED ORDER — SODIUM CHLORIDE 0.9 % IV SOLN
200.0000 mg | Freq: Once | INTRAVENOUS | Status: AC
  Administered 2020-10-15: 200 mg via INTRAVENOUS
  Filled 2020-10-15: qty 40

## 2020-10-15 MED ORDER — ONDANSETRON HCL 4 MG/2ML IJ SOLN
4.0000 mg | Freq: Once | INTRAMUSCULAR | Status: AC
  Administered 2020-10-15: 4 mg via INTRAVENOUS
  Filled 2020-10-15: qty 2

## 2020-10-15 MED ORDER — POTASSIUM CHLORIDE 2 MEQ/ML IV SOLN
INTRAVENOUS | Status: DC
  Filled 2020-10-15: qty 1000

## 2020-10-15 MED ORDER — NICOTINE 14 MG/24HR TD PT24
14.0000 mg | MEDICATED_PATCH | Freq: Every day | TRANSDERMAL | Status: DC
  Filled 2020-10-15 (×3): qty 1

## 2020-10-15 MED ORDER — PIPERACILLIN-TAZOBACTAM 3.375 G IVPB
3.3750 g | Freq: Three times a day (TID) | INTRAVENOUS | Status: DC
  Administered 2020-10-15 – 2020-10-16 (×4): 3.375 g via INTRAVENOUS
  Filled 2020-10-15 (×4): qty 50

## 2020-10-15 MED ORDER — LACTATED RINGERS IV BOLUS
1000.0000 mL | Freq: Once | INTRAVENOUS | Status: AC
  Administered 2020-10-15: 1000 mL via INTRAVENOUS

## 2020-10-15 MED ORDER — HYDRALAZINE HCL 20 MG/ML IJ SOLN
5.0000 mg | INTRAMUSCULAR | Status: DC | PRN
  Administered 2020-10-16: 5 mg via INTRAVENOUS
  Filled 2020-10-15: qty 1

## 2020-10-15 MED ORDER — ALPRAZOLAM 0.5 MG PO TABS: 0.5000 mg | ORAL_TABLET | Freq: Two times a day (BID) | ORAL | Status: DC | PRN

## 2020-10-15 MED ORDER — HEPARIN SODIUM (PORCINE) 5000 UNIT/ML IJ SOLN
5000.0000 [IU] | Freq: Three times a day (TID) | INTRAMUSCULAR | Status: DC
  Administered 2020-10-15 – 2020-10-16 (×4): 5000 [IU] via SUBCUTANEOUS
  Filled 2020-10-15 (×4): qty 1

## 2020-10-15 MED ORDER — ACETAMINOPHEN 650 MG RE SUPP: 650.0000 mg | Freq: Four times a day (QID) | RECTAL | Status: DC | PRN

## 2020-10-15 MED ORDER — CITALOPRAM HYDROBROMIDE 20 MG PO TABS
40.0000 mg | ORAL_TABLET | Freq: Every day | ORAL | Status: DC
  Administered 2020-10-15 – 2020-10-17 (×3): 40 mg via ORAL
  Filled 2020-10-15 (×3): qty 2

## 2020-10-15 MED ORDER — LISINOPRIL 20 MG PO TABS
30.0000 mg | ORAL_TABLET | Freq: Every day | ORAL | Status: DC
  Administered 2020-10-15 – 2020-10-17 (×3): 30 mg via ORAL
  Filled 2020-10-15 (×5): qty 1

## 2020-10-15 MED ORDER — SODIUM CHLORIDE 0.9% FLUSH
3.0000 mL | Freq: Two times a day (BID) | INTRAVENOUS | Status: DC
  Administered 2020-10-15 – 2020-10-17 (×2): 3 mL via INTRAVENOUS

## 2020-10-15 MED ORDER — BUPROPION HCL ER (XL) 150 MG PO TB24
150.0000 mg | ORAL_TABLET | Freq: Every day | ORAL | Status: DC
  Administered 2020-10-15 – 2020-10-17 (×3): 150 mg via ORAL
  Filled 2020-10-15 (×3): qty 1

## 2020-10-15 MED ORDER — ACETAMINOPHEN 325 MG PO TABS
650.0000 mg | ORAL_TABLET | Freq: Four times a day (QID) | ORAL | Status: DC | PRN
  Administered 2020-10-15 – 2020-10-16 (×3): 650 mg via ORAL
  Filled 2020-10-15 (×3): qty 2

## 2020-10-15 MED ORDER — ONDANSETRON HCL 4 MG PO TABS
4.0000 mg | ORAL_TABLET | Freq: Four times a day (QID) | ORAL | Status: DC | PRN
  Administered 2020-10-16: 4 mg via ORAL
  Filled 2020-10-15: qty 1

## 2020-10-15 MED ORDER — SODIUM CHLORIDE 0.9 % IV SOLN
2.0000 g | Freq: Once | INTRAVENOUS | Status: AC
  Administered 2020-10-15: 2 g via INTRAVENOUS
  Filled 2020-10-15: qty 20

## 2020-10-15 NOTE — ED Notes (Signed)
Call to 2W to give report

## 2020-10-15 NOTE — Plan of Care (Signed)

## 2020-10-15 NOTE — Consult Note (Signed)
Alexandria Newman 06-Sep-1950  [id number].    Requesting MD: Dr. Karmen Bongo Chief Complaint/Reason for Consult: gallstone pancreatitis  HPI:  This is a 70 year old white female with a history of hypertension and anxiety who underwent a Roux-en-Y gastric bypass surgery about 10 years ago in [place] who has had multiple other abdominal operations as well who states about 2 weeks ago she began having some epigastric and left upper quadrant abdominal pain radiating to her back.  This seemed to resolve on its own and has been otherwise eating well with no other issues until Tuesday.  On Tuesday she began having similar symptoms and saw a physician at her daughters primary care office at [facility] in [place].  He felt this may be some reflux and began treating her for that.  She continued to have worsening abdominal pain yesterday as well as some nausea, vomiting, and diarrhea along with chills, but no fevers.  She returned to see him for a COVID test and a urinalysis.  Her COVID and flu were negative.  She was never treated for her urine she states.  Due to worsening epigastric and right upper quadrant abdominal pain she presented to [facility] emergency department last night.  She was found to have a lipase around 5500 as well as elevated total bilirubin and other elevated liver function test.  She had a CT scan that revealed some mild peripancreatic inflammatory changes and stranding as well as some pericholecystic inflammatory changes with cholelithiasis.  She then underwent an abdominal ultrasound that revealed changes consistent with acute cholecystitis.  Her common bile duct did not show any definitive choledocholithiasis.  She currently has an MRCP ordered.  Repeat labs this morning are pending.  We have been asked to evaluate the patient for further evaluation and recommendations.  ROS: ROS: Please see HPI, otherwise all other systems have been reviewed and are  negative.  Family History  Problem Relation Age of Onset   Heart disease Mother    CAD Mother    Kidney disease Mother    Heart attack Father    Obesity Sister    Heart attack Brother        after surgery    Past Medical History:  Diagnosis Date   Anemia    Anxiety    B12 deficiency    Chronic insomnia    Degeneration of cervical intervertebral disc    Depression    Dumping syndrome 2016   per pt had "part of intestines removed"   Dysfunction of both eustachian tubes    Hypercholesteremia    Hypertension    Metabolic syndrome    Obesity    Osteoarthritis of right hand    Ovarian failure    TMJ (dislocation of temporomandibular joint)    Vitamin D deficiency     Past Surgical History:  Procedure Laterality Date   ABDOMINAL HYSTERECTOMY     APPENDECTOMY     COLONOSCOPY WITH PROPOFOL N/A 03/21/2016   Procedure: COLONOSCOPY WITH PROPOFOL;  Surgeon: Jonathon Bellows, MD;  Location: [facility] ENDOSCOPY;  Service: Endoscopy;  Laterality: N/A;   ESOPHAGOGASTRODUODENOSCOPY (EGD) WITH PROPOFOL N/A 03/21/2016   Procedure: ESOPHAGOGASTRODUODENOSCOPY (EGD) WITH PROPOFOL;  Surgeon: Jonathon Bellows, MD;  Location: [facility] ENDOSCOPY;  Service: Endoscopy;  Laterality: N/A;   GASTRIC BYPASS  2009   LAPAROSCOPY N/A 01/05/2014   Procedure: LAPAROSCOPY DIAGNOSTIC CONVERTED TO  LAPAROTOMY ILECTOMY;  Surgeon: Erroll Luna, MD;  Location: [facility];  Service: General;  Laterality: N/A;  PARTIAL COLECTOMY      Social History:  reports that she has never smoked. She has never used smokeless tobacco. She reports that she does not drink alcohol and does not use drugs.  Allergies: No Known Allergies  (Not in a hospital admission)    Physical Exam: Blood pressure (!) 152/83, pulse 66, temperature 98.5 F (36.9 C), temperature source Oral, resp. rate 16, height 5\' 6"  (1.676 m), weight 85 kg, SpO2 97 %. General: pleasant, WD, WN white female who is laying in bed in NAD HEENT: head is normocephalic,  atraumatic.  Sclera are noninjected.  PERRL.  Ears and nose without any masses or lesions.  Mouth is pink and moist Heart: regular, rate, and rhythm.  Normal s1,s2. No obvious murmurs, gallops, or rubs noted.  Palpable radial and pedal pulses bilaterally Lungs: CTAB, no wheezes, rhonchi, or rales noted.  Respiratory effort nonlabored Abd: soft, mildly tender in right upper quadrant and epigastrium., ND, +BS, no masses, hernias, or organomegaly.  She has a fairly significant thick midline scar from her multiple prior laparotomies. MS: all 4 extremities are symmetrical with no cyanosis, clubbing, or edema. Skin: warm and dry with no masses, lesions, or rashes Neuro: Cranial nerves 2-12 grossly intact, sensation is normal throughout Psych: A&Ox3 with an appropriate affect.   Results for orders placed or performed during the hospital encounter of 10/14/20 (from the past 48 hour(s))  Lactic acid, plasma     Status: None   Collection Time: 10/14/20 10:46 PM  Result Value Ref Range   Lactic Acid, Venous 1.8 0.5 - 1.9 mmol/L    Comment: Performed at [facility], [address], [place], [place] [address]  CBC with Differential     Status: Abnormal   Collection Time: 10/14/20 10:55 PM  Result Value Ref Range   WBC 15.7 (H) 4.0 - 10.5 K/uL   RBC 4.13 3.87 - 5.11 MIL/uL   Hemoglobin 12.3 12.0 - 15.0 g/dL   HCT 38.4 36.0 - 46.0 %   MCV 93.0 80.0 - 100.0 fL   MCH 29.8 26.0 - 34.0 pg   MCHC 32.0 30.0 - 36.0 g/dL   RDW 13.2 11.5 - 15.5 %   Platelets 240 150 - 400 K/uL   nRBC 0.0 0.0 - 0.2 %   Neutrophils Relative % 90 %   Neutro Abs 14.2 (H) 1.7 - 7.7 K/uL   Lymphocytes Relative 3 %   Lymphs Abs 0.5 (L) 0.7 - 4.0 K/uL   Monocytes Relative 6 %   Monocytes Absolute 0.9 0.1 - 1.0 K/uL   Eosinophils Relative 0 %   Eosinophils Absolute 0.0 0.0 - 0.5 K/uL   Basophils Relative 0 %   Basophils Absolute 0.0 0.0 - 0.1 K/uL   Immature Granulocytes 1 %   Abs Immature Granulocytes 0.10 (H) 0.00 -  0.07 K/uL    Comment: Performed at [facility] [address] [place], [place] [address]  Comprehensive metabolic panel     Status: Abnormal   Collection Time: 10/14/20 10:55 PM  Result Value Ref Range   Sodium 138 135 - 145 mmol/L   Potassium 3.1 (L) 3.5 - 5.1 mmol/L   Chloride 105 98 - 111 mmol/L   CO2 25 22 - 32 mmol/L   Glucose, Bld 125 (H) 70 - 99 mg/dL    Comment: Glucose reference range applies only to samples taken after fasting for at least 8 hours.   BUN 7 (L) 8 - 23 mg/dL  Creatinine, Ser 1.03 (H) 0.44 - 1.00 mg/dL   Calcium 9.0 8.9 - 10.3 mg/dL   Total Protein 6.2 (L) 6.5 - 8.1 g/dL   Albumin 3.4 (L) 3.5 - 5.0 g/dL   AST 156 (H) 15 - 41 U/L   ALT 196 (H) 0 - 44 U/L   Alkaline Phosphatase 120 38 - 126 U/L   Total Bilirubin 6.9 (H) 0.3 - 1.2 mg/dL   GFR, Estimated 58 (L) >60 mL/min    Comment: (NOTE) Calculated using the CKD-EPI Creatinine Equation (2021)    Anion gap 8 5 - 15    Comment: Performed at [facility], [place] [address] [place], [place] [address]  Lipase, blood     Status: Abnormal   Collection Time: 10/14/20 10:55 PM  Result Value Ref Range   Lipase 5,572 (H) 11 - 51 U/L    Comment: RESULTS CONFIRMED BY MANUAL DILUTION Performed at [facility], [place] [address], [place], [place] [address]   Urinalysis, Routine w reflex microscopic Urine, Random     Status: Abnormal   Collection Time: 10/15/20  7:50 AM  Result Value Ref Range   Color, Urine AMBER (A) YELLOW    Comment: BIOCHEMICALS MAY BE AFFECTED BY COLOR   APPearance CLEAR CLEAR   Specific Gravity, Urine 1.005 1.005 - 1.030   pH 6.0 5.0 - 8.0   Glucose, UA NEGATIVE NEGATIVE mg/dL   Hgb urine dipstick SMALL (A) NEGATIVE   Bilirubin Urine NEGATIVE NEGATIVE   Ketones, ur NEGATIVE NEGATIVE mg/dL   Protein, ur NEGATIVE NEGATIVE mg/dL   Nitrite NEGATIVE NEGATIVE   Leukocytes,Ua NEGATIVE NEGATIVE   RBC / HPF 0-5 0 - 5 RBC/hpf   WBC, UA 0-5 0 - 5 WBC/hpf   Bacteria, UA NONE SEEN  NONE SEEN   Squamous Epithelial / LPF 0-5 0 - 5    Comment: Performed at [facility], [place] [address], [place], [place] [address]   CT ABDOMEN PELVIS WO CONTRAST  Result Date: 10/14/2020 CLINICAL DATA:  Left lower quadrant abdominal pain, fever EXAM: CT ABDOMEN AND PELVIS WITHOUT CONTRAST TECHNIQUE: Multidetector CT imaging of the abdomen and pelvis was performed following the standard protocol without IV contrast. COMPARISON:  01/04/2014 FINDINGS: Lower chest: Mild left basilar scarring is unchanged. Mild coronary artery calcification. Small pericardial effusion is unchanged, possibly physiologic. Hepatobiliary: Cholelithiasis noted. There is mild pericholecystic inflammatory stranding and possible trace pericholecystic fluid suggesting changes of superimposed acute cholecystitis. The gallbladder, however, is not distended. The liver is unremarkable. No intra or extrahepatic biliary ductal dilation. Pancreas: There is mild peripancreatic inflammatory stranding in keeping with changes of acute pancreatitis. Pancreatic parenchymal viability is not well assessed on this noncontrast examination. The pancreatic duct is not dilated. No parenchymal calcifications are seen. No loculated peripancreatic fluid collections are identified. Spleen: Unremarkable Adrenals/Urinary Tract: The adrenal glands are unremarkable. The kidneys are normal. The bladder is unremarkable. 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 Partial small bowel resection has been performed with a surgical staple line noted within the distal small bowel within the right lower quadrant. Appendectomy has been performed. The stomach, small bowel, and large bowel are otherwise unremarkable. No free intraperitoneal gas or fluid. 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 Vascular/Lymphatic: Moderate aortoiliac atherosclerotic calcification. No aortic aneurysm. Reproductive: Status post hysterectomy. No adnexal masses. Other: No abdominal wall  hernia identified. The rectum is unremarkable. Musculoskeletal: Degenerative changes are seen within the lumbar spine. No lytic or blastic bone lesions are seen. IMPRESSION: Mild peripancreatic inflammatory stranding suspicious for changes of acute pancreatitis. Correlation with serum lipase and amylase would be helpful in confirming this finding. Mild pericholecystic inflammatory change. Superimposed cholelithiasis. The findings can be seen in setting of acute cholecystitis, though lack of gallbladder distension is unusual. Inflammatory changes could also reflect changes of cholangitis related to the inflammatory process within the pancreas. Dedicated right upper quadrant sonography may be helpful for further evaluation. Aortic Atherosclerosis (ICD10-I70.0). Electronically Signed   By: Fidela Salisbury MD   On: 10/14/2020 23:42   US Abdomen Limited  Result Date: 10/15/2020 CLINICAL DATA:  Cholecystitis versus cholangitis. EXAM: ULTRASOUND ABDOMEN LIMITED RIGHT UPPER QUADRANT COMPARISON:  CT abdomen pelvis 10/14/2020 FINDINGS: Gallbladder: Multiple calcified gallstones within the gallbladder lumen with associated gallbladder wall thickening and pericholecystic fluid. No sonographic Murphy sign noted by sonographer. Common bile duct: Diameter: 4 mm Liver: No focal lesion identified. Within normal limits in parenchymal echogenicity. Portal vein is patent on color Doppler imaging with normal direction of blood flow towards the liver. Other: None. IMPRESSION: Cholelithiasis with acute cholecystitis. No findings suggest choledocholithiasis. Electronically Signed   By: Iven Finn M.D.   On: 10/15/2020 03:55      Assessment/Plan Gallstone pancreatitis The patient's imaging as well as laboratory data has been reviewed.  All of  this is certainly consistent with gallstone pancreatitis.  Her last total bilirubin was noted to be 6.9 which is certainly concerning for choledocholithiasis.  Her imaging did not show any evidence of this.  Therefore, an MRCP has been ordered and is currently pending for further evaluation.  Her history of gastric bypass would certainly make this situation more challenging if she were to have choledocholithiasis as a straightforward ERCP would not be able to be done.  We will await these results for further recommendations in that regard.  Overall, once her pancreatitis has resolved and we determine whether she has choledocholithiasis or not, we can then discuss proceeding with a cholecystectomy.  Given her extensive past surgical history, she may be difficult to do laparoscopically and we discussed the possibility of an open procedure.  For now, her pancreatitis needs to resolve prior to surgical intervention.  We recommend n.p.o. status as well as aggressive fluid hydration for her pancreatitis.  She is currently on Zosyn.  She has minimal tenderness over her right upper quadrant.  It is difficult to say whether her pericholecystic changes on ultrasound are secondary to peripancreatic inflammatory changes versus true cholecystitis.  Her white blood cell count is elevated at 15,000; however this may be related to her pancreatitis.  Given cholecystitis cannot be definitively ruled out at this point, it is reasonable to continue with antibiotic therapy.  The patient understands the current plan of care and is agreeable.   FEN -NPO/IV fluids VTE -okay from surgical standpoint for chemical prophylaxis ID -[id number]  History of multiple abdominal operations Hypertension [place], [facility] Surgery 10/15/2020, 8:32 AM Please see Amion for pager number during day hours 7:00am-4:30pm or 7:00am -11:30am on weekends

## 2020-10-15 NOTE — ED Notes (Signed)
Patient transported to MRI 

## 2020-10-15 NOTE — Progress Notes (Signed)
  MRCP is negative for choledocholithiasis   We will see if other ?'s arise but cannot see that we have other input.  Let me know  Secure Chat sent also   Gatha Mayer, MD, [facility] 10/15/2020 10:22 AM

## 2020-10-15 NOTE — ED Provider Notes (Signed)
[facility] EMERGENCY DEPARTMENT Provider Note   CSN: [id number] Arrival date & time: 10/14/20  2032     History Chief Complaint  Patient presents with   Abdominal Pain    UTI    Alexandria Newman [address] [employer] [address] [employer] is a 70 y.o. female.  The history is provided by the patient.  Abdominal Pain She has history of hypertension, hyperlipidemia, gastric bypass with Roux-en-Y and comes in because of abdominal pain with nausea and vomiting.  She has been having left upper abdominal pain radiating around to the back for approximately the last week.  She did see her primary care provider who got a urine sample but she did not get any report on the results.  Yesterday, she started having increased pain with associated nausea and vomiting.  She denies fever but has had some chills and sweats.  She denies any diarrhea.  Pain was severe earlier, as severe as 9/10.  Pain is currently subsided to where it is 4/10.   Past Medical History:  Diagnosis Date   Anemia    Anxiety    B12 deficiency    Chronic insomnia    Degeneration of cervical intervertebral disc    Depression    Dumping syndrome 2016   per pt had "part of intestines removed"   Dysfunction of both eustachian tubes    Hypercholesteremia    Hypertension    Metabolic syndrome    Obesity    Osteoarthritis of right hand    Ovarian failure    TMJ (dislocation of temporomandibular joint)    Vitamin D deficiency     Patient Active Problem List   Diagnosis Date Noted   Protein calorie malnutrition ([place]) 11/08/2016   H/O gastric bypass 11/01/2016   Iron deficiency anemia 02/15/2016   Senile purpura ([place]) 08/10/2015   Hyperlipidemia 02/04/2015   History of small bowel obstruction 02/04/2015   Hypertension, benign 02/04/2015   DDD (degenerative disc disease), cervical 02/04/2015   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   Insomnia 02/04/2015   Osteoarthritis of finger of right hand 54/27/0623   Metabolic  syndrome 76/28/3151   Depression, major, single episode, 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   Vitamin D deficiency 02/04/2015   Vitamin B12 deficiency 02/04/2015   Bariatric surgery status     Past Surgical History:  Procedure Laterality Date   ABDOMINAL HYSTERECTOMY     APPENDECTOMY     COLONOSCOPY WITH PROPOFOL N/A 03/21/2016   Procedure: COLONOSCOPY WITH PROPOFOL;  Surgeon: Jonathon Bellows, MD;  Location: [facility] ENDOSCOPY;  Service: Endoscopy;  Laterality: N/A;   ESOPHAGOGASTRODUODENOSCOPY (EGD) WITH PROPOFOL N/A 03/21/2016   Procedure: ESOPHAGOGASTRODUODENOSCOPY (EGD) WITH PROPOFOL;  Surgeon: Jonathon Bellows, MD;  Location: [facility] ENDOSCOPY;  Service: Endoscopy;  Laterality: N/A;   GASTRIC BYPASS  2009   LAPAROSCOPY N/A 01/05/2014   Procedure: LAPAROSCOPY DIAGNOSTIC CONVERTED TO  LAPAROTOMY ILECTOMY;  Surgeon: Erroll Luna, MD;  Location: [place];  Service: General;  Laterality: N/A;   PARTIAL COLECTOMY       OB History     Gravida      Para      Term      Preterm      AB      Living  2      SAB      IAB      Ectopic      Multiple      Live Births  Family History  Problem Relation Age of Onset   Heart disease Mother    CAD Mother    Kidney disease Mother    Heart attack Father    Obesity Sister    Heart attack Brother        after surgery    Social History   Tobacco Use   Smoking status: Never   Smokeless tobacco: Never   Tobacco comments:    smoking cessation materials not required  Vaping Use   Vaping Use: Never used  Substance Use Topics   Alcohol use: No    Alcohol/week: 0.0 standard drinks   Drug use: No    Home Medications Prior to Admission medications   Medication Sig Start Date End Date Taking? Authorizing Provider  acetaminophen (TYLENOL) 325 MG tablet Take 2 tablets (650 mg total) by mouth every 6 (six) hours as needed for mild pain (or Temp > 100). 11/07/13   Earnstine Regal, PA-C  aspirin  (ASPIRIN CHILDRENS) 81 MG chewable tablet Chew 1 tablet (81 mg total) by mouth daily. 11/08/16   Steele Sizer, MD  atenolol (TENORMIN) 25 MG tablet Take 1 tablet (25 mg total) by mouth daily. 06/02/17   Steele Sizer, MD  atorvastatin (LIPITOR) 40 MG tablet Take 1 tablet (40 mg total) by mouth every evening. 06/02/17   Steele Sizer, MD  buPROPion (WELLBUTRIN XL) 150 MG 24 hr tablet TAKE 1 TABLET EVERY DAY 09/25/17   Ancil Boozer, Drue Stager, MD  citalopram (CELEXA) 20 MG tablet TAKE 1 TABLET EVERY DAY Patient taking differently: TAKE 1 TABLET 20mg  EVERY DAY 09/25/17   Steele Sizer, MD  lisinopril (PRINIVIL,ZESTRIL) 20 MG tablet Take 1 tablet (20 mg total) by mouth daily. 06/02/17   Steele Sizer, MD  Menthol-Camphor (ICY HOT ADVANCED RELIEF) 16-11 % CREA Apply 1 application topically as needed (for neck and wrist pain).    [provider]  temazepam (RESTORIL) 15 MG capsule Take 1 capsule (15 mg total) by mouth at bedtime as needed for sleep. Patient not taking: Reported on 11/05/2017 06/02/17   Steele Sizer, MD    Allergies    Patient has no known allergies.  Review of Systems   Review of Systems  Gastrointestinal:  Positive for abdominal pain.  All other systems reviewed and are negative.  Physical Exam Updated Vital Signs BP (!) 142/73 (BP Location: Right Arm)   Pulse 68   Temp 98.1 F (36.7 C) (Oral)   Resp 16   Ht 5\' 6"  (1.676 m)   Wt 85 kg   SpO2 96%   BMI 30.25 kg/m   Physical Exam Vitals and nursing note reviewed.    year old 30male, resting comfortably and in no acute distress. Vital signs are significant for borderline elevated blood pressure. Oxygen saturation is 96 %, which is normal. Head is normocephalic and atraumatic. PERRLA, EOMI. Oropharynx is clear. Neck is nontender and supple without adenopathy or JVD. Back is nontender and there is no CVA tenderness. Lungs are clear without rales, wheezes, or rhonchi. Chest is nontender. Heart has regular rate and  rhythm without murmur. Abdomen is soft, flat, nontender without masses or hepatosplenomegaly and peristalsis is normoactive. Extremities have no cyanosis or edema, full range of motion is present. Skin is warm and dry without rash. Neurologic: Mental status is normal, cranial nerves are intact, there are no motor or sensory deficits.  ED Results / Procedures / Treatments   Labs (all labs ordered are listed, but only abnormal results are displayed) Labs Reviewed  CBC WITH DIFFERENTIAL/PLATELET - Abnormal; Notable for the following components:      Result Value   WBC 15.7 (*)    Neutro Abs 14.2 (*)    Lymphs Abs 0.5 (*)    Abs Immature Granulocytes 0.10 (*)    All other components within normal limits  COMPREHENSIVE METABOLIC PANEL - Abnormal; Notable for the following components:   Potassium 3.1 (*)    Glucose, Bld 125 (*)    BUN 7 (*)    Creatinine, Ser 1.03 (*)    Total Protein 6.2 (*)    Albumin 3.4 (*)    AST 156 (*)    ALT 196 (*)    Total Bilirubin 6.9 (*)    GFR, Estimated 58 (*)    All other components within normal limits  LIPASE, BLOOD - Abnormal; Notable for the following components:   Lipase 5,572 (*)    All other components within normal limits  CULTURE, BLOOD (ROUTINE X 2)  CULTURE, BLOOD (ROUTINE X 2)  LACTIC ACID, PLASMA  LACTIC ACID, PLASMA    EKG None  Radiology CT ABDOMEN PELVIS WO CONTRAST  Result Date: 10/14/2020 CLINICAL DATA:  Left lower quadrant abdominal pain, fever EXAM: CT ABDOMEN AND PELVIS WITHOUT CONTRAST TECHNIQUE: Multidetector CT imaging of the abdomen and pelvis was performed following the standard protocol without IV contrast. COMPARISON:  01/04/2014 FINDINGS: Lower chest: Mild left basilar scarring is unchanged. Mild coronary artery calcification. Small pericardial effusion is unchanged, possibly physiologic. Hepatobiliary: Cholelithiasis noted. There is mild pericholecystic inflammatory stranding and possible trace pericholecystic fluid  suggesting changes of superimposed acute cholecystitis. The gallbladder, however, is not distended. The liver is unremarkable. No intra or extrahepatic biliary ductal dilation. Pancreas: There is mild peripancreatic inflammatory stranding in keeping with changes of acute pancreatitis. Pancreatic parenchymal viability is not well assessed on this noncontrast examination. The pancreatic duct is not dilated. No parenchymal calcifications are seen. No loculated peripancreatic fluid collections are identified. Spleen: Unremarkable Adrenals/Urinary Tract: The adrenal glands are unremarkable. The kidneys are normal. The bladder is unremarkable. 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 Partial small bowel resection has been performed with a surgical staple line noted within the distal small bowel within the right lower quadrant. Appendectomy has been performed. The stomach, small bowel, and large bowel are otherwise unremarkable. No free intraperitoneal gas or fluid. 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 Vascular/Lymphatic: Moderate aortoiliac atherosclerotic calcification. No aortic aneurysm. Reproductive: Status post hysterectomy. No adnexal masses. Other: No abdominal wall hernia identified. The rectum is unremarkable. Musculoskeletal: Degenerative changes are seen within the lumbar spine. No lytic or blastic bone lesions are seen. IMPRESSION: Mild peripancreatic inflammatory stranding suspicious for changes of acute pancreatitis. Correlation with serum lipase and amylase would be helpful in confirming this finding. Mild pericholecystic inflammatory change. Superimposed cholelithiasis. The findings can be seen in setting of acute cholecystitis, though lack of gallbladder distension is unusual. Inflammatory changes could also  reflect changes of cholangitis related to the inflammatory process within the pancreas. Dedicated right upper quadrant sonography may be helpful for further evaluation. Aortic Atherosclerosis (ICD10-I70.0). Electronically Signed   By: Fidela Salisbury MD   On: 10/14/2020 23:42   US Abdomen Limited  Result Date: 10/15/2020 CLINICAL DATA:  Cholecystitis versus cholangitis. EXAM: ULTRASOUND ABDOMEN LIMITED RIGHT UPPER QUADRANT COMPARISON:  CT abdomen pelvis 10/14/2020 FINDINGS: Gallbladder: Multiple calcified gallstones within the  gallbladder lumen with associated gallbladder wall thickening and pericholecystic fluid. No sonographic Murphy sign noted by sonographer. Common bile duct: Diameter: 4 mm Liver: No focal lesion identified. Within normal limits in parenchymal echogenicity. Portal vein is patent on color Doppler imaging with normal direction of blood flow towards the liver. Other: None. IMPRESSION: Cholelithiasis with acute cholecystitis. No findings suggest choledocholithiasis. Electronically Signed   By: Iven Finn M.D.   On: 10/15/2020 03:55    Procedures Procedures   Medications Ordered in ED Medications  lactated ringers bolus 1,000 mL (has no administration in time range)  cefTRIAXone (ROCEPHIN) 2 g in sodium chloride 0.9 % 100 mL IVPB (2 g Intravenous New Bag/Given 10/15/20 0624)  metroNIDAZOLE (FLAGYL) IVPB 500 mg (has no administration in time range)  potassium chloride 10 mEq in 100 mL IVPB (has no administration in time range)  ondansetron (ZOFRAN) injection 4 mg (4 mg Intravenous Given 10/14/20 2256)  cephALEXin (KEFLEX) capsule 500 mg (500 mg Oral Given 10/14/20 2251)  ondansetron (ZOFRAN) injection 4 mg (4 mg Intravenous Given 10/15/20 0618)  morphine 4 MG/ML injection 4 mg (4 mg Intravenous Given 10/15/20 7416)    ED Course  I have reviewed the triage vital signs and the nursing notes.  Pertinent labs & imaging results that were available during my care of the patient were  reviewed by me and considered in my medical decision making (see chart for details).   MDM Rules/Calculators/A&P                         Upper abdominal pain with nausea and vomiting.  Labs and scans and ultrasound were ordered at triage showing markedly elevated lipase of 5572 and very elevated bilirubin of 6.9.  Transaminases are moderately elevated with AST 156 and ALT 196.  Mild hypokalemia also present.  Alkaline phosphatase is normal.  WBC is elevated at 15.7 with left shift.  CT scan shows evidence of acute cholecystitis and pancreatitis, ultrasound confirmed cholelithiasis with acute cholecystitis but no evidence of choledocholithiasis.  Case was discussed with Dr. Windle Guard of general surgery service who states that patient will be seen in consultation but needs to be admitted to the medical service because of elevated bilirubin and transaminases.  Case is discussed with Dr. Nevada Crane of [facility] hospitalist who agrees to admit the patient.  Case also discussed with Dr. Silverio Decamp of gastroenterology service who recommends MRCP because of history of Roux-en-Y bypass.  She was started on antibiotics of ceftriaxone and metronidazole.  Final Clinical Impression(s) / ED Diagnoses Final diagnoses:  Acute cholecystitis  Acute pancreatitis without infection or necrosis, unspecified pancreatitis type  Serum total bilirubin elevated  Elevated transaminase level  Hypokalemia    Rx / DC Orders ED Discharge Orders     None        Delora Fuel, MD 38/45/36 [phone]

## 2020-10-15 NOTE — Progress Notes (Signed)
Pharmacy Antibiotic Note  Alexandria Newman is a 70 y.o. female admitted on 10/14/2020 with  intra abdominal infection .  Pharmacy has been consulted for zosyn dosing.  Plan: Zosyn 3.375g IV q8h (4 hour infusion). F/u cultures and clinical course  Height: 5\' 6"  (167.6 cm) Weight: 85 kg (187 lb 6.3 oz) IBW/kg (Calculated) : 59.3  Temp (24hrs), Avg:98.1 F (36.7 C), Min:98.1 F (36.7 C), Max:98.1 F (36.7 C)  Recent Labs  Lab 10/14/20 2246 10/14/20 2255  WBC  --  15.7*  CREATININE  --  1.03*  LATICACIDVEN 1.8  --     Estimated Creatinine Clearance: 55 mL/min (A) (by C-G formula based on SCr of 1.03 mg/dL (H)).    No Known Allergies   Thank you for allowing pharmacy to be a part of this patient's care.  Beverlee Nims 10/15/2020 6:51 AM

## 2020-10-15 NOTE — H&P (Signed)
History and Physical    Alexandria Newman ZOX:[id number] DOB: [birth year] DOA: 10/14/2020  PCP: Tracie Harrier, MD Consultants:  None Patient coming from:  Home - lives with husband and son; NOK: Levon Hedger [facility], [phone]  Chief Complaint: Abdominal pain  HPI: Alexandria Newman is a 70 y.o. female with medical history significant of anxiety/depression; dumping syndrome s/p gastric bypass; HLD; HTN; and obesity presenting with abdominal pain.  She reports that she has had 4 episodes of abdominal pain. The first was about 2 weeks and was so bad that she thought she would have to come to the ER, but it improved.   She had 2 more episodes that were milder and so she went to her PCP about this on 6/21.  She was started on Protonix and Bentyl and told to stop NSAIDs.  She had fever and chills and felt bad and so went to her PCP yesterday and was COVID tested and negative; she was concerned about a UTI but did not get antibiotics.  Overnight, the abdominal pain worsened so much and radiated e a band across the entire top of her stomach and so she decided to come to the ER.  Her urine was orange in color.  The abdominal pain episodes appear to be related to eating.    ED Course: Carryover, per Dr. Nevada Crane:  70 year old female history of SBO presents from home via EMS with complaints of abdominal pain and left lower back pain with onset last week and worsening in the last 2 days, recently diagnosed with UTI by PCP.  Associated with vomiting and chills.  Work-up in the ED revealed concern for acute cholangitis.  GI and general surgery consulted by EDP, Dr. Roxanne Mins.  Blood cultures obtained and started on Rocephin and IV Flagyl empirically by EDP.   Review of Systems: As per HPI; otherwise review of systems reviewed and negative.   Ambulatory Status:  Ambulates without assistance  COVID Vaccine Status:  Complete plus 1 booster  Past Medical History:  Diagnosis Date    Anemia    Anxiety    B12 deficiency    Chronic insomnia    Degeneration of cervical intervertebral disc    Depression    Dumping syndrome 2016   per pt had "part of intestines removed"   Dysfunction of both eustachian tubes    Hypercholesteremia    Hypertension    Metabolic syndrome    Obesity    Osteoarthritis of right hand    Ovarian failure    TMJ (dislocation of temporomandibular joint)    Vitamin D deficiency     Past Surgical History:  Procedure Laterality Date   ABDOMINAL HYSTERECTOMY     APPENDECTOMY     COLONOSCOPY WITH PROPOFOL N/A 03/21/2016   Procedure: COLONOSCOPY WITH PROPOFOL;  Surgeon: Jonathon Bellows, MD;  Location: [facility] ENDOSCOPY;  Service: Endoscopy;  Laterality: N/A;   ESOPHAGOGASTRODUODENOSCOPY (EGD) WITH PROPOFOL N/A 03/21/2016   Procedure: ESOPHAGOGASTRODUODENOSCOPY (EGD) WITH PROPOFOL;  Surgeon: Jonathon Bellows, MD;  Location: [facility] ENDOSCOPY;  Service: Endoscopy;  Laterality: N/A;   GASTRIC BYPASS  2009   LAPAROSCOPY N/A 01/05/2014   Procedure: LAPAROSCOPY DIAGNOSTIC CONVERTED TO  LAPAROTOMY ILECTOMY;  Surgeon: Erroll Luna, MD;  Location: [facility] OR;  Service: General;  Laterality: N/A;   PARTIAL COLECTOMY      Social History   Socioeconomic History   Marital status: Married    Spouse name: Coralyn Mark   Number of children: 2   Years of education: GED   Highest  education level: Not on file  Occupational History   Occupation: Retired  Tobacco Use   Smoking status: Never   Smokeless tobacco: Never   Tobacco comments:    smoking cessation materials not required  Vaping Use   Vaping Use: Never used  Substance and Sexual Activity   Alcohol use: No    Alcohol/week: 0.0 standard drinks   Drug use: No   Sexual activity: Never  Other Topics Concern   Not on file  Social History Narrative   Not on file   Social Determinants of Health   Financial Resource Strain: Not on file  Food Insecurity: Not on file  Transportation Needs: Not on file  Physical Activity:  Not on file  Stress: Not on file  Social Connections: Not on file  Intimate Partner Violence: Not on file    No Known Allergies  Family History  Problem Relation Age of Onset   Heart disease Mother    CAD Mother    Kidney disease Mother    Heart attack Father    Obesity Sister    Heart attack Brother        after surgery    Prior to Admission medications   Medication Sig Start Date End Date Taking? Authorizing Provider  acetaminophen (TYLENOL) 325 MG tablet Take 2 tablets (650 mg total) by mouth every 6 (six) hours as needed for mild pain (or Temp > 100). 11/07/13  Yes Earnstine Regal, PA-C  ALPRAZolam Duanne Moron) 0.5 MG tablet Take 0.5 mg by mouth 2 (two) times daily as needed for anxiety. 07/16/20  Yes [provider]  Artificial Tear Ointment (DRY EYES OP) Place 1 drop into both eyes daily as needed (dry eyes).   Yes [provider]  atenolol (TENORMIN) 25 MG tablet Take 1 tablet (25 mg total) by mouth daily. 06/02/17  Yes Sowles, Drue Stager, MD  azelastine (ASTELIN) 0.1 % nasal spray Place 1 spray into the nose 2 (two) times daily as needed for rhinitis or allergies. 09/24/20 09/24/21 Yes [provider]  buPROPion (WELLBUTRIN XL) 150 MG 24 hr tablet TAKE 1 TABLET EVERY DAY Patient taking differently: Take 150 mg by mouth daily. 09/25/17  Yes Sowles, Drue Stager, MD  citalopram (CELEXA) 40 MG tablet Take 40 mg by mouth daily. 08/08/20  Yes [provider]  cyanocobalamin (,VITAMIN B-12,) 1000 MCG/ML injection Inject 1,000 mcg into the muscle every 30 (thirty) days.   Yes [provider]  dicyclomine (BENTYL) 10 MG capsule Take 10 mg by mouth 3 (three) times daily. 10/13/20  Yes [provider]  fluticasone (FLONASE) 50 MCG/ACT nasal spray Place 2 sprays into both nostrils daily as needed for allergies. 09/24/20  Yes [provider]  lisinopril (ZESTRIL) 30 MG tablet Take 30 mg by mouth daily.   Yes [provider]  Menthol-Camphor  16-11 % CREA Apply 1 application topically as needed (for neck and wrist pain).   Yes [provider]  aspirin (ASPIRIN CHILDRENS) 81 MG chewable tablet Chew 1 tablet (81 mg total) by mouth daily. Patient not taking: Reported on 10/15/2020 11/08/16   Steele Sizer, MD  atorvastatin (LIPITOR) 40 MG tablet Take 1 tablet (40 mg total) by mouth every evening. Patient not taking: Reported on 10/15/2020 06/02/17   Steele Sizer, MD  citalopram (CELEXA) 20 MG tablet TAKE 1 TABLET EVERY DAY Patient not taking: Reported on 10/15/2020 09/25/17   Steele Sizer, MD  lisinopril (PRINIVIL,ZESTRIL) 20 MG tablet Take 1 tablet (20 mg total) by mouth daily.  Patient not taking: Reported on 10/15/2020 06/02/17   Steele Sizer, MD  pantoprazole (PROTONIX) 40 MG tablet Take 40 mg by mouth daily. 10/14/20   [provider]  temazepam (RESTORIL) 15 MG capsule Take 1 capsule (15 mg total) by mouth at bedtime as needed for sleep. Patient not taking: No sig reported 06/02/17   Steele Sizer, MD    Physical Exam: Vitals:   10/15/20 1115 10/15/20 1144 10/15/20 1233 10/15/20 1630  BP: (!) 162/84 (!) 162/84 (!) 171/73   Pulse: (!) 56 73 77 (!) 51  Resp:  18 17 20   Temp:      TempSrc:      SpO2: 97% 99% 98% (!) 82%  Weight:      Height:         General:  Appears calm and comfortable and is in NAD Eyes:  PERRL, EOMI, normal lids, iris ENT:  grossly normal hearing, lips & tongue, mmm Neck:  no LAD, masses or thyromegaly Cardiovascular:  RRR, no m/r/g. No LE edema.  Respiratory:   CTA bilaterally with no wheezes/rales/rhonchi.  Normal respiratory effort. Abdomen:  soft, primarily epigastric TTP, ND, NABS Skin:  no rash or induration seen on limited exam Musculoskeletal:  grossly normal tone BUE/BLE, good ROM, no bony abnormality Psychiatric:  grossly normal mood and affect, speech fluent and appropriate, AOx3 Neurologic:  CN 2-12 grossly intact, moves all extremities in coordinated  fashion    Radiological Exams on Admission: Independently reviewed - see discussion in A/P where applicable  CT ABDOMEN PELVIS WO CONTRAST  Result Date: 10/14/2020 CLINICAL DATA:  Left lower quadrant abdominal pain, fever EXAM: CT ABDOMEN AND PELVIS WITHOUT CONTRAST TECHNIQUE: Multidetector CT imaging of the abdomen and pelvis was performed following the standard protocol without IV contrast. COMPARISON:  01/04/2014 FINDINGS: Lower chest: Mild left basilar scarring is unchanged. Mild coronary artery calcification. Small pericardial effusion is unchanged, possibly physiologic. Hepatobiliary: Cholelithiasis noted. There is mild pericholecystic inflammatory stranding and possible trace pericholecystic fluid suggesting changes of superimposed acute cholecystitis. The gallbladder, however, is not distended. The liver is unremarkable. No intra or extrahepatic biliary ductal dilation. Pancreas: There is mild peripancreatic inflammatory stranding in keeping with changes of acute pancreatitis. Pancreatic parenchymal viability is not well assessed on this noncontrast examination. The pancreatic duct is not dilated. No parenchymal calcifications are seen. No loculated peripancreatic fluid collections are identified. Spleen: Unremarkable Adrenals/Urinary Tract: The adrenal glands are unremarkable. The kidneys are normal. The bladder is unremarkable. 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 Partial small bowel resection has been performed with a surgical staple line noted within the distal small bowel within the right lower quadrant. Appendectomy has been performed. The stomach, small bowel, and large bowel are otherwise unremarkable. No free intraperitoneal gas or fluid. 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 Vascular/Lymphatic:  Moderate aortoiliac atherosclerotic calcification. No aortic aneurysm. Reproductive: Status post hysterectomy. No adnexal masses. Other: No abdominal wall hernia identified. The rectum is unremarkable. Musculoskeletal: Degenerative changes are seen within the lumbar spine. No lytic or blastic bone lesions are seen. IMPRESSION: Mild peripancreatic inflammatory stranding suspicious for changes of acute pancreatitis. Correlation with serum lipase and amylase would be helpful in confirming this finding. Mild pericholecystic inflammatory change. Superimposed cholelithiasis. The findings can be seen in setting of acute cholecystitis, though lack of gallbladder distension is unusual. Inflammatory changes could also reflect changes of  cholangitis related to the inflammatory process within the pancreas. Dedicated right upper quadrant sonography may be helpful for further evaluation. Aortic Atherosclerosis (ICD10-I70.0). Electronically Signed   By: Fidela Salisbury MD   On: 10/14/2020 23:42   MR ABDOMEN MRCP WO CONTRAST  Result Date: 10/15/2020 CLINICAL DATA:  Abdominal pain, nausea, vomiting, jaundice, acute cholecystitis and choledocholithiasis EXAM: MRI ABDOMEN WITHOUT CONTRAST  (INCLUDING MRCP) TECHNIQUE: Multiplanar multisequence MR imaging of the abdomen was performed. Heavily T2-weighted images of the biliary and pancreatic ducts were obtained, and three-dimensional MRCP images were rendered by post processing. COMPARISON:  CT abdomen pelvis, 10/14/2020, right upper quadrant ultrasound, 10/15/2020 FINDINGS: Lower chest: No acute findings. Hepatobiliary: No mass or other noncontrast parenchymal abnormality identified. Mildly distended gallbladder with gallbladder wall thickening and adjacent fat stranding. Small gallstones in the dependent gallbladder. No biliary ductal dilatation or filling defect to the ampulla. Pancreas: No noncontrast mass, inflammatory changes, or other parenchymal abnormality identified. No  pancreatic ductal dilatation. Spleen:  Within normal limits in size and appearance. Adrenals/Urinary Tract: No masses identified. No evidence of hydronephrosis. Stomach/Bowel: Visualized portions within the abdomen are unremarkable. Vascular/Lymphatic: No pathologically enlarged lymph nodes identified. No abdominal aortic aneurysm demonstrated. Other:  None. Musculoskeletal: No suspicious bone lesions identified. IMPRESSION: 1. Mildly distended gallbladder with gallbladder wall thickening and adjacent fat stranding. Small gallstones in the dependent gallbladder. Findings are consistent with acute cholecystitis. 2. No evidence of choledocholithiasis. No biliary ductal dilatation or filling defect to the ampulla. Electronically Signed   By: Eddie Candle M.D.   On: 10/15/2020 09:47   MR 3D Recon At Scanner  Result Date: 10/15/2020 CLINICAL DATA:  Abdominal pain, nausea, vomiting, jaundice, acute cholecystitis and choledocholithiasis EXAM: MRI ABDOMEN WITHOUT CONTRAST  (INCLUDING MRCP) TECHNIQUE: Multiplanar multisequence MR imaging of the abdomen was performed. Heavily T2-weighted images of the biliary and pancreatic ducts were obtained, and three-dimensional MRCP images were rendered by post processing. COMPARISON:  CT abdomen pelvis, 10/14/2020, right upper quadrant ultrasound, 10/15/2020 FINDINGS: Lower chest: No acute findings. Hepatobiliary: No mass or other noncontrast parenchymal abnormality identified. Mildly distended gallbladder with gallbladder wall thickening and adjacent fat stranding. Small gallstones in the dependent gallbladder. No biliary ductal dilatation or filling defect to the ampulla. Pancreas: No noncontrast mass, inflammatory changes, or other parenchymal abnormality identified. No pancreatic ductal dilatation. Spleen:  Within normal limits in size and appearance. Adrenals/Urinary Tract: No masses identified. No evidence of hydronephrosis. Stomach/Bowel: Visualized portions within the  abdomen are unremarkable. Vascular/Lymphatic: No pathologically enlarged lymph nodes identified. No abdominal aortic aneurysm demonstrated. Other:  None. Musculoskeletal: No suspicious bone lesions identified. IMPRESSION: 1. Mildly distended gallbladder with gallbladder wall thickening and adjacent fat stranding. Small gallstones in the dependent gallbladder. Findings are consistent with acute cholecystitis. 2. No evidence of choledocholithiasis. No biliary ductal dilatation or filling defect to the ampulla. Electronically Signed   By: Eddie Candle M.D.   On: 10/15/2020 09:47   US Abdomen Limited  Result Date: 10/15/2020 CLINICAL DATA:  Cholecystitis versus cholangitis. EXAM: ULTRASOUND ABDOMEN LIMITED RIGHT UPPER QUADRANT COMPARISON:  CT abdomen pelvis 10/14/2020 FINDINGS: Gallbladder: Multiple calcified gallstones within the gallbladder lumen with associated gallbladder wall thickening and pericholecystic fluid. No sonographic Murphy sign noted by sonographer. Common bile duct: Diameter: 4 mm Liver: No focal lesion identified. Within normal limits in parenchymal echogenicity. Portal vein is patent on color Doppler imaging with normal direction of blood flow towards the liver. Other: None. IMPRESSION: Cholelithiasis with acute cholecystitis. No findings suggest choledocholithiasis. Electronically Signed  By: Iven Finn M.D.   On: 10/15/2020 03:55    EKG: not done   Labs on Admission: I have personally reviewed the available labs and imaging studies at the time of the admission.  Pertinent labs:   K+ 3.1 Glucose 125 BUN 7/Creatinine 1.03/GFR 58 Lipase 5572 AST 156/ALT 196/Bili 6.9 WBC 15.7 Lactate 1.8   Assessment/Plan Principal Problem:   Acute cholecystitis Active Problems:   Hyperlipidemia   Hypertension, benign   Acute gallstone pancreatitis   COVID-19 virus infection   Cholecystitis -Patient with several recent episodes of abdominal pain presenting with worsening  RUQ/midepigastric pain -Elevated LFTs, concerning for ductal involvement -CT with acute pancreatitis and superimposed cholelithiasis, possibly cholecystitis -[place] with cholelithiasis with acute cholecystitis without findings to suggest choledocholithiasis -Marked lipase elevation and imaging c/w pancreatitis, likely related to gallstones (see below) -MRCP with acute cholecystitis without ductal involvement -NPO -Morphine for pain, Zofran for nausea -Surgery consulting and will decide timing for cholecystectomy - which will be complicated due to h/o gastric bypass -Empiric coverage with Zosyn for now   Gallstone pancreatitis -As noted above, apparent gallstone pancreatitis based on cholecystitis, marked elevation of lipase, and CT c/w pancreatitis -Strict NPO for now -Aggressive IVF hydration at least for the first 12 hours with LR at 200 cc/hr -Pain control with morphine 2 mg q2h prn.   -Nausea control with Zofran -Will admit, as she is likely to remain in the hospital through the weekend to allow the inflammation to cool prior to surgery -Surgery has been consulted and is already seeing the patient; it seems likely that they will wait at least a couple of days before attempting cholecystectomy to allow the LFTs to improve -GI has seen the patient but there appears to be no indication for ongoing consultation  COVID -Patient with 3 immunizations and negative testing yesterday, but positive today -No apparent symptoms or mild symptoms - hard to tell if her symptoms are due to her GI issues (see above) or if there is a component of symptomatic COVID -No O2 requirement -COVID POSITIVE -Pertinent labs concerning for COVID include elevated CRP; elevated procalcitonin; elevated D-dimer; and elevated fibrinogen - which could be due to COVID or intraabdominal infection -Will order Remdesivir (pharmacy consult) given +COVID test for incidental COVID infection   HTN -Continue Atenolol,  Lisinopril  Mood d/o -Continue Xanax, Wellbutrin, Celexa  Obesity -s/p gastric bypass -Body mass index is 30.25 kg/m..  -Weight loss should be encouraged -Outpatient PCP/bariatric medicine f/u encouraged     Note: This patient has been tested and is negative for the novel coronavirus COVID-19. The patient has been fully vaccinated against COVID-19.   Level of care: Progressive DVT prophylaxis: SCDs Code Status:  Full - confirmed with patient Family Communication: None present Disposition Plan:  The patient is from: home  Anticipated d/c is to: home without [facility] services   Anticipated d/c date will depend on clinical response to treatment, likely at least through the weekend  Patient is currently: acutely ill Consults called: Surgery; GI  Admission status:  Admit - It is my clinical opinion that admission to INPATIENT is reasonable and necessary because of the expectation that this patient will require hospital care that crosses at least 2 midnights to treat this condition based on the medical complexity of the problems presented.  Given the aforementioned information, the predictability of an adverse outcome is felt to be significant.    Karmen Bongo MD [facility]   How to contact the [facility] Attending  or Consulting provider [place] or covering provider during after hours [place], for this patient?  Check the care team in [facility] and look for a) attending/consulting [facility] provider listed and b) the [facility] team listed Log into [email or web address] and use 's universal password to access. If you do not have the password, please contact the hospital operator. Locate the [facility] provider you are looking for under [facility] and page to a number that you can be directly reached. If you still have difficulty reaching the provider, please page the [facility] (Director on Call) for the Hospitalists listed on amion for assistance.   10/15/2020, 7:09 PM

## 2020-10-16 ENCOUNTER — Inpatient Hospital Stay (HOSPITAL_COMMUNITY): Payer: Medicare HMO

## 2020-10-16 DIAGNOSIS — K810 Acute cholecystitis: Secondary | ICD-10-CM

## 2020-10-16 LAB — COMPREHENSIVE METABOLIC PANEL
ALT: 105 U/L — ABNORMAL HIGH (ref 0–44)
AST: 62 U/L — ABNORMAL HIGH (ref 15–41)
Albumin: 2.7 g/dL — ABNORMAL LOW (ref 3.5–5.0)
Alkaline Phosphatase: 98 U/L (ref 38–126)
Anion gap: 9 (ref 5–15)
BUN: 7 mg/dL — ABNORMAL LOW (ref 8–23)
CO2: 25 mmol/L (ref 22–32)
Calcium: 8.6 mg/dL — ABNORMAL LOW (ref 8.9–10.3)
Chloride: 104 mmol/L (ref 98–111)
Creatinine, Ser: 0.77 mg/dL (ref 0.44–1.00)
GFR, Estimated: >60 mL/min (ref >60)
Glucose, Bld: 70 mg/dL (ref 70–99)
Potassium: 3.5 mmol/L (ref 3.5–5.1)
Sodium: 138 mmol/L (ref 135–145)
Total Bilirubin: 1.6 mg/dL — ABNORMAL HIGH (ref 0.3–1.2)
Total Protein: 5.5 g/dL — ABNORMAL LOW (ref 6.5–8.1)

## 2020-10-16 LAB — C-REACTIVE PROTEIN: CRP: 11.6 mg/dL — ABNORMAL HIGH (ref <1.0)

## 2020-10-16 LAB — D-DIMER, QUANTITATIVE: D-Dimer, Quant: 2.46 ug/mL-FEU — ABNORMAL HIGH (ref 0.00–0.50)

## 2020-10-16 LAB — CBC
HCT: 32.4 % — ABNORMAL LOW (ref 36.0–46.0)
Hemoglobin: 10.4 g/dL — ABNORMAL LOW (ref 12.0–15.0)
MCH: 29.2 pg (ref 26.0–34.0)
MCHC: 32.1 g/dL (ref 30.0–36.0)
MCV: 91 fL (ref 80.0–100.0)
Platelets: 188 10*3/uL (ref 150–400)
RBC: 3.56 MIL/uL — ABNORMAL LOW (ref 3.87–5.11)
RDW: 13.3 % (ref 11.5–15.5)
WBC: 8 10*3/uL (ref 4.0–10.5)
nRBC: 0 % (ref 0.0–0.2)

## 2020-10-16 LAB — MAGNESIUM: Magnesium: 2.1 mg/dL (ref 1.7–2.4)

## 2020-10-16 LAB — FERRITIN: Ferritin: 103 ng/mL (ref 11–307)

## 2020-10-16 LAB — URINE CULTURE: Culture: <10000 — AB

## 2020-10-16 LAB — LIPASE, BLOOD: Lipase: 262 U/L — ABNORMAL HIGH (ref 11–51)

## 2020-10-16 MED ORDER — HYDRALAZINE HCL 20 MG/ML IJ SOLN
10.0000 mg | INTRAMUSCULAR | Status: DC | PRN
  Filled 2020-10-16: qty 1

## 2020-10-16 MED ORDER — ENOXAPARIN SODIUM 40 MG/0.4ML IJ SOSY
40.0000 mg | PREFILLED_SYRINGE | INTRAMUSCULAR | Status: DC
  Administered 2020-10-16: 40 mg via SUBCUTANEOUS
  Filled 2020-10-16: qty 0.4

## 2020-10-16 NOTE — Progress Notes (Signed)
PROGRESS NOTE    Alexandria Newman  EHU:[id number] DOB: [birth year] DOA: 10/14/2020 PCP: Tracie Harrier, MD  [phone]   Assessment & Plan:   Principal Problem:   Acute cholecystitis Active Problems:   Hyperlipidemia   Hypertension, benign   Acute gallstone pancreatitis   COVID-19 virus infection    Alexandria Newman is a 70 y.o. female with medical history significant of anxiety/depression; dumping syndrome s/p gastric bypass; HLD; HTN; and obesity presenting with abdominal pain.   Cholecystitis, ruled out -Patient with several recent episodes of abdominal pain presenting with worsening RUQ/midepigastric pain --started on IV zosyn on presentation. -MRCP showed findings consistent with acute cholecystitis, however, HIDA neg for cholecystitis. --GenSurg consulted Plan: --d/c zosyn, per [place], no further abx necessary.   Gallstone pancreatitis --lipase >5000 on presentation, already trended down to 200's after 2 days. Plan: --clear liquid diet for now, advance as tolerated --cont MIVF at 75 ml/hr --given pos covid, GenSurg will plan outpatient cholecystectomy with bariatrics   COVID infection, asymptomatic -Patient with 3 immunizations  --no respiratory symptoms, No O2 requirement Plan: --cont Remdesivir for 3 day treatment   HTN -BP severely elevated --cont atenolol and Lisinopril --IV hydralazine PRN   Mood d/o -on home Xanax, Wellbutrin, Celexa --cont home regimen   Obesity -s/p gastric bypass -Body mass index is 30.25 kg/m.. -Weight loss should be encouraged -Outpatient PCP/bariatric medicine f/u encouraged    DVT prophylaxis: Lovenox SQ Code Status: Full code  Family Communication:  Level of care: Med-Surg Dispo:   The patient is from: home Anticipated d/c is to: home Anticipated d/c date is: 1-2 days Patient currently is not medically ready to d/c due to: need to advance diet   Subjective and Interval History:  No  abdominal pain.  No dyspnea.  Pt urinating a lot.   Objective: Vitals:   10/16/20 0700 10/16/20 1147 10/16/20 1158 10/16/20 1300  BP: (!) 146/55 (!) 201/60 (!) 212/70 (!) 175/67  Pulse: (!) 55 (!) 56 (!) 56 62  Resp: 18 16 (!) 22 (!) 22  Temp: 98.4 F (36.9 C) 98.3 F (36.8 C)    TempSrc: Oral Oral    SpO2: 91% 97% 99% 95%  Weight:      Height:        Intake/Output Summary (Last 24 hours) at 10/16/2020 1614 Last data filed at 10/16/2020 1300 Gross per 24 hour  Intake 4103.94 ml  Output --  Net 4103.94 ml   Filed Weights   10/14/20 2235  Weight: 85 kg    Examination:   Constitutional: NAD, AAOx3 HEENT: conjunctivae and lids normal, EOMI CV: No cyanosis.   RESP: normal respiratory effort, on RA GI: abdomen non-tender Extremities: No effusions, edema in BLE SKIN: warm, dry Neuro: II - XII grossly intact.   Psych: Normal mood and affect.  Appropriate judgement and reason   Data Reviewed: I have personally reviewed following labs and imaging studies  CBC: Recent Labs  Lab 10/14/20 2255 10/15/20 0750 10/16/20 0413  WBC 15.7* 14.8* 8.0  NEUTROABS 14.2* 12.0*  --   HGB 12.3 10.7* 10.4*  HCT 38.4 33.1* 32.4*  MCV 93.0 92.2 91.0  PLT 240 185 287   Basic Metabolic Panel: Recent Labs  Lab 10/14/20 2255 10/15/20 0750 10/16/20 0413  NA 138 137 138  K 3.1* 4.2 3.5  CL 105 104 104  CO2 25 25 25   GLUCOSE 125* 103* 70  BUN 7* 8 7*  CREATININE 1.03* 0.89 0.77  CALCIUM 9.0 8.4* 8.6*  MG  --  1.6* 2.1  PHOS  --  3.5  --    GFR: Estimated Creatinine Clearance: 70.9 mL/min (by C-G formula based on SCr of 0.77 mg/dL). Liver Function Tests: Recent Labs  Lab 10/14/20 2255 10/15/20 0750 10/16/20 0413  AST 156* 111* 62*  ALT 196* 149* 105*  ALKPHOS 120 100 98  BILITOT 6.9* 4.2* 1.6*  PROT 6.2* 5.5* 5.5*  ALBUMIN 3.4* 2.8* 2.7*   Recent Labs  Lab 10/14/20 2255 10/16/20 0413  LIPASE 5,572* 262*   No results for input(s): AMMONIA in the last 168  hours. Coagulation Profile: No results for input(s): INR, PROTIME in the last 168 hours. Cardiac Enzymes: No results for input(s): CKTOTAL, CKMB, CKMBINDEX, TROPONINI in the last 168 hours. BNP (last 3 results) No results for input(s): PROBNP in the last 8760 hours. HbA1C: No results for input(s): HGBA1C in the last 72 hours. CBG: No results for input(s): GLUCAP in the last 168 hours. Lipid Profile: No results for input(s): CHOL, HDL, LDLCALC, TRIG, CHOLHDL, LDLDIRECT in the last 72 hours. Thyroid Function Tests: No results for input(s): TSH, T4TOTAL, FREET4, T3FREE, THYROIDAB in the last 72 hours. Anemia Panel: Recent Labs    10/15/20 1124 10/16/20 0413  FERRITIN 112 103   Sepsis Labs: Recent Labs  Lab 10/14/20 2246 10/15/20 1020  PROCALCITON  --  7.32  LATICACIDVEN 1.8  --     Recent Results (from the past 240 hour(s))  Resp Panel by RT-PCR (Flu A&B, Covid) Nasopharyngeal Swab     Status: Abnormal   Collection Time: 10/15/20  6:50 AM   Specimen: Nasopharyngeal Swab; Nasopharyngeal(NP) swabs in vial transport medium  Result Value Ref Range Status   SARS Coronavirus 2 by RT PCR POSITIVE (A) NEGATIVE Final    Comment: RESULT CALLED TO, READ BACK BY AND VERIFIED WITH: RN S.NEWSOME AT 5681 ON 10/15/2020 BY T.SAAD. (NOTE) SARS-CoV-2 target nucleic acids are DETECTED.  The SARS-CoV-2 RNA is generally detectable in upper respiratory specimens during the acute phase of infection. Positive results are indicative of the presence of the identified virus, but do not rule out bacterial infection or co-infection with other pathogens not detected by the test. Clinical correlation with patient history and other diagnostic information is necessary to determine patient infection status. The expected result is Negative.  Fact Sheet for Patients: EntrepreneurPulse.com.au  Fact Sheet for Healthcare Providers: IncredibleEmployment.be  This test is  not yet approved or cleared by the [place] FDA and  has been authorized for detection and/or diagnosis of SARS-CoV-2 by FDA under an Emergency Use Authorization (EUA).  This EUA will remain in effect (meaning this  test can be used) for the duration of  the COVID-19 declaration under Section 564(b)(1) of the Act, 21 U.S.C. section 360bbb-3(b)(1), unless the authorization is terminated or revoked sooner.     Influenza A by PCR NEGATIVE NEGATIVE Final   Influenza B by PCR NEGATIVE NEGATIVE Final    Comment: (NOTE) The Xpert Xpress SARS-CoV-2/FLU/RSV plus assay is intended as an aid in the diagnosis of influenza from Nasopharyngeal swab specimens and should not be used as a sole basis for treatment. Nasal washings and aspirates are unacceptable for Xpert Xpress SARS-CoV-2/FLU/RSV testing.  Fact Sheet for Patients: EntrepreneurPulse.com.au  Fact Sheet for Healthcare Providers: IncredibleEmployment.be  This test is not yet approved or cleared by the [place] FDA and has been authorized for detection and/or diagnosis of SARS-CoV-2 by FDA under an Emergency Use Authorization (EUA). This EUA will remain in  effect (meaning this test can be used) for the duration of the COVID-19 declaration under Section 564(b)(1) of the Act, 21 U.S.C. section 360bbb-3(b)(1), unless the authorization is terminated or revoked.  Performed at [facility], [place] [address] [place], [place] [address]   Culture, Urine     Status: Abnormal   Collection Time: 10/15/20  7:50 AM   Specimen: Urine, Random  Result Value Ref Range Status   Specimen Description URINE, RANDOM  Final   Special Requests NONE  Final   Culture (A)  Final    <10,000 COLONIES/mL INSIGNIFICANT GROWTH Performed at [facility], [place] [address] [place], [place] [address]    Report Status 10/16/2020 FINAL  Final      Radiology Studies: CT ABDOMEN PELVIS WO CONTRAST  Result  Date: 10/14/2020 CLINICAL DATA:  Left lower quadrant abdominal pain, fever EXAM: CT ABDOMEN AND PELVIS WITHOUT CONTRAST TECHNIQUE: Multidetector CT imaging of the abdomen and pelvis was performed following the standard protocol without IV contrast. COMPARISON:  01/04/2014 FINDINGS: Lower chest: Mild left basilar scarring is unchanged. Mild coronary artery calcification. Small pericardial effusion is unchanged, possibly physiologic. Hepatobiliary: Cholelithiasis noted. There is mild pericholecystic inflammatory stranding and possible trace pericholecystic fluid suggesting changes of superimposed acute cholecystitis. The gallbladder, however, is not distended. The liver is unremarkable. No intra or extrahepatic biliary ductal dilation. Pancreas: There is mild peripancreatic inflammatory stranding in keeping with changes of acute pancreatitis. Pancreatic parenchymal viability is not well assessed on this noncontrast examination. The pancreatic duct is not dilated. No parenchymal calcifications are seen. No loculated peripancreatic fluid collections are identified. Spleen: Unremarkable Adrenals/Urinary Tract: The adrenal glands are unremarkable. The kidneys are normal. The bladder is unremarkable. 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 Partial small bowel resection has been performed with a surgical staple line noted within the distal small bowel within the right lower quadrant. Appendectomy has been performed. The stomach, small bowel, and large bowel are otherwise unremarkable. No free intraperitoneal gas or fluid. 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 Vascular/Lymphatic: Moderate aortoiliac atherosclerotic calcification. No aortic aneurysm. Reproductive: Status post hysterectomy. No adnexal masses. Other: No abdominal wall  hernia identified. The rectum is unremarkable. Musculoskeletal: Degenerative changes are seen within the lumbar spine. No lytic or blastic bone lesions are seen. IMPRESSION: Mild peripancreatic inflammatory stranding suspicious for changes of acute pancreatitis. Correlation with serum lipase and amylase would be helpful in confirming this finding. Mild pericholecystic inflammatory change. Superimposed cholelithiasis. The findings can be seen in setting of acute cholecystitis, though lack of gallbladder distension is unusual. Inflammatory changes could also reflect changes of cholangitis related to the inflammatory process within the pancreas. Dedicated right upper quadrant sonography may be helpful for further evaluation. Aortic Atherosclerosis (ICD10-I70.0). Electronically Signed   By: Fidela Salisbury MD   On: 10/14/2020 23:42   NM Hepatobiliary Liver Func  Result Date: 10/16/2020 CLINICAL DATA:  Concern for cholecystitis. EXAM: NUCLEAR MEDICINE HEPATOBILIARY IMAGING TECHNIQUE: Sequential images of the abdomen were obtained out to 60 minutes following intravenous administration of radiopharmaceutical. RADIOPHARMACEUTICALS:  7.2 mCi Tc-69m  Choletec IV COMPARISON:  MRI October 15, 2020 and ultrasound October 15, 2020 FINDINGS: Slightly delayed clearance of radiotracer from the blood pool and hepatic parenchyma. Normal filling of the intrahepatic ducts, common bile duct. Gallbladder activity is visualized, consistent with patency of cystic duct (normal < 60  minutes). Additionally there is normal biliary to bowel transit (normal < 60 minutes), consistent with patent common bile duct. IMPRESSION: No scintigraphic evidence of acute cholecystitis. Slightly delayed clearance of radiotracer from the blood pool and hepatic parenchyma suggestive of mild hepatocellular dysfunction/hepatitis. Electronically Signed   By: Dahlia Bailiff MD   On: 10/16/2020 12:24   MR ABDOMEN MRCP WO CONTRAST  Result Date: 10/15/2020 CLINICAL  DATA:  Abdominal pain, nausea, vomiting, jaundice, acute cholecystitis and choledocholithiasis EXAM: MRI ABDOMEN WITHOUT CONTRAST  (INCLUDING MRCP) TECHNIQUE: Multiplanar multisequence MR imaging of the abdomen was performed. Heavily T2-weighted images of the biliary and pancreatic ducts were obtained, and three-dimensional MRCP images were rendered by post processing. COMPARISON:  CT abdomen pelvis, 10/14/2020, right upper quadrant ultrasound, 10/15/2020 FINDINGS: Lower chest: No acute findings. Hepatobiliary: No mass or other noncontrast parenchymal abnormality identified. Mildly distended gallbladder with gallbladder wall thickening and adjacent fat stranding. Small gallstones in the dependent gallbladder. No biliary ductal dilatation or filling defect to the ampulla. Pancreas: No noncontrast mass, inflammatory changes, or other parenchymal abnormality identified. No pancreatic ductal dilatation. Spleen:  Within normal limits in size and appearance. Adrenals/Urinary Tract: No masses identified. No evidence of hydronephrosis. Stomach/Bowel: Visualized portions within the abdomen are unremarkable. Vascular/Lymphatic: No pathologically enlarged lymph nodes identified. No abdominal aortic aneurysm demonstrated. Other:  None. Musculoskeletal: No suspicious bone lesions identified. IMPRESSION: 1. Mildly distended gallbladder with gallbladder wall thickening and adjacent fat stranding. Small gallstones in the dependent gallbladder. Findings are consistent with acute cholecystitis. 2. No evidence of choledocholithiasis. No biliary ductal dilatation or filling defect to the ampulla. Electronically Signed   By: Eddie Candle M.D.   On: 10/15/2020 09:47   MR 3D Recon At Scanner  Result Date: 10/15/2020 CLINICAL DATA:  Abdominal pain, nausea, vomiting, jaundice, acute cholecystitis and choledocholithiasis EXAM: MRI ABDOMEN WITHOUT CONTRAST  (INCLUDING MRCP) TECHNIQUE: Multiplanar multisequence MR imaging of the abdomen  was performed. Heavily T2-weighted images of the biliary and pancreatic ducts were obtained, and three-dimensional MRCP images were rendered by post processing. COMPARISON:  CT abdomen pelvis, 10/14/2020, right upper quadrant ultrasound, 10/15/2020 FINDINGS: Lower chest: No acute findings. Hepatobiliary: No mass or other noncontrast parenchymal abnormality identified. Mildly distended gallbladder with gallbladder wall thickening and adjacent fat stranding. Small gallstones in the dependent gallbladder. No biliary ductal dilatation or filling defect to the ampulla. Pancreas: No noncontrast mass, inflammatory changes, or other parenchymal abnormality identified. No pancreatic ductal dilatation. Spleen:  Within normal limits in size and appearance. Adrenals/Urinary Tract: No masses identified. No evidence of hydronephrosis. Stomach/Bowel: Visualized portions within the abdomen are unremarkable. Vascular/Lymphatic: No pathologically enlarged lymph nodes identified. No abdominal aortic aneurysm demonstrated. Other:  None. Musculoskeletal: No suspicious bone lesions identified. IMPRESSION: 1. Mildly distended gallbladder with gallbladder wall thickening and adjacent fat stranding. Small gallstones in the dependent gallbladder. Findings are consistent with acute cholecystitis. 2. No evidence of choledocholithiasis. No biliary ductal dilatation or filling defect to the ampulla. Electronically Signed   By: Eddie Candle M.D.   On: 10/15/2020 09:47   US Abdomen Limited  Result Date: 10/15/2020 CLINICAL DATA:  Cholecystitis versus cholangitis. EXAM: ULTRASOUND ABDOMEN LIMITED RIGHT UPPER QUADRANT COMPARISON:  CT abdomen pelvis 10/14/2020 FINDINGS: Gallbladder: Multiple calcified gallstones within the gallbladder lumen with associated gallbladder wall thickening and pericholecystic fluid. No sonographic Murphy sign noted by sonographer. Common bile duct: Diameter: 4 mm Liver: No focal lesion identified. Within normal limits  in parenchymal echogenicity. Portal vein is patent on color Doppler imaging with normal direction  of blood flow towards the liver. Other: None. IMPRESSION: Cholelithiasis with acute cholecystitis. No findings suggest choledocholithiasis. Electronically Signed   By: Iven Finn M.D.   On: 10/15/2020 03:55     Scheduled Meds:  atenolol  25 mg Oral Daily   buPROPion  150 mg Oral Daily   citalopram  40 mg Oral Daily   heparin injection (subcutaneous)  5,000 Units Subcutaneous Q8H   lisinopril  30 mg Oral Daily   nicotine  14 mg Transdermal Daily   sodium chloride flush  3 mL Intravenous Q12H   Continuous Infusions:  lactated ringers 75 mL/hr at 10/16/20 1458   piperacillin-tazobactam (ZOSYN)  IV 12.5 mL/hr at 10/16/20 1300   remdesivir 100 mg in NS 100 mL Stopped (10/16/20 1227)     LOS: 1 day     Enzo Bi, MD [facility] If 7PM-7AM, please contact night-coverage 10/16/2020, 4:14 PM

## 2020-10-16 NOTE — Progress Notes (Signed)
Subjective: Patient feeling better today.  Less abdominal pain today and nausea.  HIDA scan negative for cholecystitis.    ROS: See above, otherwise other systems negative  Objective: Vital signs in last 24 hours: Temp:  [97.8 F (36.6 C)-98.4 F (36.9 C)] 98.3 F (36.8 C) (06/24 1147) Pulse Rate:  [51-77] 56 (06/24 1158) Resp:  [16-22] 22 (06/24 1158) BP: (132-212)/(51-73) 212/70 (06/24 1158) SpO2:  [82 %-99 %] 99 % (06/24 1158) Last BM Date: 10/15/20  Intake/Output from previous day: 06/23 0701 - 06/24 0700 In: 3819.2 [I.V.:2516.3; IV Piggyback:1302.9] Out: -  Intake/Output this shift: Total I/O In: 833.3 [I.V.:826.9; IV Piggyback:6.4] Out: -   PE: Heart: regular Lungs: CTAB Abd: soft, minimally tender in periumbilical region, +BS, ND  Lab Results:  Recent Labs    10/15/20 0750 10/16/20 0413  WBC 14.8* 8.0  HGB 10.7* 10.4*  HCT 33.1* 32.4*  PLT 185 188   BMET Recent Labs    10/15/20 0750 10/16/20 0413  NA 137 138  K 4.2 3.5  CL 104 104  CO2 25 25  GLUCOSE 103* 70  BUN 8 7*  CREATININE 0.89 0.77  CALCIUM 8.4* 8.6*   PT/INR No results for input(s): LABPROT, INR in the last 72 hours. CMP     Component Value Date/Time   NA 138 10/16/2020 0413   NA 142 02/04/2015 0913   K 3.5 10/16/2020 0413   CL 104 10/16/2020 0413   CO2 25 10/16/2020 0413   GLUCOSE 70 10/16/2020 0413   BUN 7 (L) 10/16/2020 0413   BUN 13 02/04/2015 0913   CREATININE 0.77 10/16/2020 0413   CREATININE 0.85 11/08/2016 1006   CALCIUM 8.6 (L) 10/16/2020 0413   PROT 5.5 (L) 10/16/2020 0413   PROT 6.7 02/04/2015 0913   ALBUMIN 2.7 (L) 10/16/2020 0413   ALBUMIN 4.0 02/04/2015 0913   AST 62 (H) 10/16/2020 0413   ALT 105 (H) 10/16/2020 0413   ALKPHOS 98 10/16/2020 0413   BILITOT 1.6 (H) 10/16/2020 0413   BILITOT 0.5 02/04/2015 0913   GFRNONAA >60 10/16/2020 0413   GFRNONAA 71 11/08/2016 1006   GFRAA >60 11/05/2017 1003   GFRAA 82 11/08/2016 1006   Lipase      Component Value Date/Time   LIPASE 5,572 (H) 10/14/2020 2255       Studies/Results: CT ABDOMEN PELVIS WO CONTRAST  Result Date: 10/14/2020 CLINICAL DATA:  Left lower quadrant abdominal pain, fever EXAM: CT ABDOMEN AND PELVIS WITHOUT CONTRAST TECHNIQUE: Multidetector CT imaging of the abdomen and pelvis was performed following the standard protocol without IV contrast. COMPARISON:  01/04/2014 FINDINGS: Lower chest: Mild left basilar scarring is unchanged. Mild coronary artery calcification. Small pericardial effusion is unchanged, possibly physiologic. Hepatobiliary: Cholelithiasis noted. There is mild pericholecystic inflammatory stranding and possible trace pericholecystic fluid suggesting changes of superimposed acute cholecystitis. The gallbladder, however, is not distended. The liver is unremarkable. No intra or extrahepatic biliary ductal dilation. Pancreas: There is mild peripancreatic inflammatory stranding in keeping with changes of acute pancreatitis. Pancreatic parenchymal viability is not well assessed on this noncontrast examination. The pancreatic duct is not dilated. No parenchymal calcifications are seen. No loculated peripancreatic fluid collections are identified. Spleen: Unremarkable Adrenals/Urinary Tract: The adrenal glands are unremarkable. The kidneys are normal. The bladder is unremarkable. 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 Partial small bowel resection has been performed with a surgical staple line noted within the distal small bowel within the right lower quadrant.  Appendectomy has been performed. The stomach, small bowel, and large bowel are otherwise unremarkable. No free intraperitoneal gas or fluid. 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 Vascular/Lymphatic: Moderate aortoiliac  atherosclerotic calcification. No aortic aneurysm. Reproductive: Status post hysterectomy. No adnexal masses. Other: No abdominal wall hernia identified. The rectum is unremarkable. Musculoskeletal: Degenerative changes are seen within the lumbar spine. No lytic or blastic bone lesions are seen. IMPRESSION: Mild peripancreatic inflammatory stranding suspicious for changes of acute pancreatitis. Correlation with serum lipase and amylase would be helpful in confirming this finding. Mild pericholecystic inflammatory change. Superimposed cholelithiasis. The findings can be seen in setting of acute cholecystitis, though lack of gallbladder distension is unusual. Inflammatory changes could also reflect changes of cholangitis related to the inflammatory process within the pancreas. Dedicated right upper quadrant sonography may be helpful for further evaluation. Aortic Atherosclerosis (ICD10-I70.0). Electronically Signed   By: Fidela Salisbury MD   On: 10/14/2020 23:42   MR ABDOMEN MRCP WO CONTRAST  Result Date: 10/15/2020 CLINICAL DATA:  Abdominal pain, nausea, vomiting, jaundice, acute cholecystitis and choledocholithiasis EXAM: MRI ABDOMEN WITHOUT CONTRAST  (INCLUDING MRCP) TECHNIQUE: Multiplanar multisequence MR imaging of the abdomen was performed. Heavily T2-weighted images of the biliary and pancreatic ducts were obtained, and three-dimensional MRCP images were rendered by post processing. COMPARISON:  CT abdomen pelvis, 10/14/2020, right upper quadrant ultrasound, 10/15/2020 FINDINGS: Lower chest: No acute findings. Hepatobiliary: No mass or other noncontrast parenchymal abnormality identified. Mildly distended gallbladder with gallbladder wall thickening and adjacent fat stranding. Small gallstones in the dependent gallbladder. No biliary ductal dilatation or filling defect to the ampulla. Pancreas: No noncontrast mass, inflammatory changes, or other parenchymal abnormality identified. No pancreatic ductal  dilatation. Spleen:  Within normal limits in size and appearance. Adrenals/Urinary Tract: No masses identified. No evidence of hydronephrosis. Stomach/Bowel: Visualized portions within the abdomen are unremarkable. Vascular/Lymphatic: No pathologically enlarged lymph nodes identified. No abdominal aortic aneurysm demonstrated. Other:  None. Musculoskeletal: No suspicious bone lesions identified. IMPRESSION: 1. Mildly distended gallbladder with gallbladder wall thickening and adjacent fat stranding. Small gallstones in the dependent gallbladder. Findings are consistent with acute cholecystitis. 2. No evidence of choledocholithiasis. No biliary ductal dilatation or filling defect to the ampulla. Electronically Signed   By: Eddie Candle M.D.   On: 10/15/2020 09:47   MR 3D Recon At Scanner  Result Date: 10/15/2020 CLINICAL DATA:  Abdominal pain, nausea, vomiting, jaundice, acute cholecystitis and choledocholithiasis EXAM: MRI ABDOMEN WITHOUT CONTRAST  (INCLUDING MRCP) TECHNIQUE: Multiplanar multisequence MR imaging of the abdomen was performed. Heavily T2-weighted images of the biliary and pancreatic ducts were obtained, and three-dimensional MRCP images were rendered by post processing. COMPARISON:  CT abdomen pelvis, 10/14/2020, right upper quadrant ultrasound, 10/15/2020 FINDINGS: Lower chest: No acute findings. Hepatobiliary: No mass or other noncontrast parenchymal abnormality identified. Mildly distended gallbladder with gallbladder wall thickening and adjacent fat stranding. Small gallstones in the dependent gallbladder. No biliary ductal dilatation or filling defect to the ampulla. Pancreas: No noncontrast mass, inflammatory changes, or other parenchymal abnormality identified. No pancreatic ductal dilatation. Spleen:  Within normal limits in size and appearance. Adrenals/Urinary Tract: No masses identified. No evidence of hydronephrosis. Stomach/Bowel: Visualized portions within the abdomen are  unremarkable. Vascular/Lymphatic: No pathologically enlarged lymph nodes identified. No abdominal aortic aneurysm demonstrated. Other:  None. Musculoskeletal: No suspicious bone lesions identified. IMPRESSION: 1. Mildly distended gallbladder with gallbladder wall thickening and adjacent fat  stranding. Small gallstones in the dependent gallbladder. Findings are consistent with acute cholecystitis. 2. No evidence of choledocholithiasis. No biliary ductal dilatation or filling defect to the ampulla. Electronically Signed   By: Eddie Candle M.D.   On: 10/15/2020 09:47   US Abdomen Limited  Result Date: 10/15/2020 CLINICAL DATA:  Cholecystitis versus cholangitis. EXAM: ULTRASOUND ABDOMEN LIMITED RIGHT UPPER QUADRANT COMPARISON:  CT abdomen pelvis 10/14/2020 FINDINGS: Gallbladder: Multiple calcified gallstones within the gallbladder lumen with associated gallbladder wall thickening and pericholecystic fluid. No sonographic Murphy sign noted by sonographer. Common bile duct: Diameter: 4 mm Liver: No focal lesion identified. Within normal limits in parenchymal echogenicity. Portal vein is patent on color Doppler imaging with normal direction of blood flow towards the liver. Other: None. IMPRESSION: Cholelithiasis with acute cholecystitis. No findings suggest choledocholithiasis. Electronically Signed   By: Iven Finn M.D.   On: 10/15/2020 03:55    Anti-infectives: Anti-infectives (From admission, onward)    Start     Dose/Rate Route Frequency Ordered Stop   10/16/20 1000  remdesivir 100 mg in sodium chloride 0.9 % 100 mL IVPB       See Hyperspace for full Linked Orders Report.   100 mg 200 mL/hr over 30 Minutes Intravenous Daily 10/15/20 0945 10/18/20 0959   10/15/20 1100  remdesivir 200 mg in sodium chloride 0.9% 250 mL IVPB       See Hyperspace for full Linked Orders Report.   200 mg 580 mL/hr over 30 Minutes Intravenous Once 10/15/20 0945 10/15/20 1239   10/15/20 0800  piperacillin-tazobactam  (ZOSYN) IVPB 3.375 g        3.375 g 12.5 mL/hr over 240 Minutes Intravenous Every 8 hours 10/15/20 0650     10/15/20 0630  metroNIDAZOLE (FLAGYL) IVPB 500 mg        500 mg 100 mL/hr over 60 Minutes Intravenous  Once 10/15/20 0624 10/15/20 0901   10/15/20 0600  cefTRIAXone (ROCEPHIN) 2 g in sodium chloride 0.9 % 100 mL IVPB        2 g 200 mL/hr over 30 Minutes Intravenous  Once 10/15/20 0557 10/15/20 0655   10/14/20 2245  cephALEXin (KEFLEX) capsule 500 mg        500 mg Oral  Once 10/14/20 2241 10/14/20 2251        Assessment/Plan Gallstone pancreatitis -feeling better today -adv to CLD and low fat in the am if tolerates her CLD -HIDA scan negative for cholecystitis -normal standard of care is lap chole this admission for gs pancreatitis, however, given her COVID, we want to delay her surgery a couple of weeks to minimize risks of complications -no further abx therapy warranted. -follow up with a bariatric surgeon being arranged at the request of Dr. Dema Severin given her prior bypass and anatomy.    FEN - CLD, low fat in am VTE - heparin SQ ID - zosyn, but can stop this  COVID HTN Anxiety   LOS: 1 day    Henreitta Cea , [facility] Surgery 10/16/2020, 12:19 PM Please see Amion for pager number during day hours 7:00am-4:30pm or 7:00am -11:30am on weekends

## 2020-10-17 LAB — CBC
HCT: 31.9 % — ABNORMAL LOW (ref 36.0–46.0)
Hemoglobin: 10.5 g/dL — ABNORMAL LOW (ref 12.0–15.0)
MCH: 29.4 pg (ref 26.0–34.0)
MCHC: 32.9 g/dL (ref 30.0–36.0)
MCV: 89.4 fL (ref 80.0–100.0)
Platelets: 185 10*3/uL (ref 150–400)
RBC: 3.57 MIL/uL — ABNORMAL LOW (ref 3.87–5.11)
RDW: 13.2 % (ref 11.5–15.5)
WBC: 6.1 10*3/uL (ref 4.0–10.5)
nRBC: 0 % (ref 0.0–0.2)

## 2020-10-17 LAB — COMPREHENSIVE METABOLIC PANEL
ALT: 79 U/L — ABNORMAL HIGH (ref 0–44)
AST: 41 U/L (ref 15–41)
Albumin: 2.6 g/dL — ABNORMAL LOW (ref 3.5–5.0)
Alkaline Phosphatase: 88 U/L (ref 38–126)
Anion gap: 5 (ref 5–15)
BUN: <5 mg/dL — ABNORMAL LOW (ref 8–23)
CO2: 28 mmol/L (ref 22–32)
Calcium: 8.5 mg/dL — ABNORMAL LOW (ref 8.9–10.3)
Chloride: 106 mmol/L (ref 98–111)
Creatinine, Ser: 0.72 mg/dL (ref 0.44–1.00)
GFR, Estimated: >60 mL/min (ref >60)
Glucose, Bld: 80 mg/dL (ref 70–99)
Potassium: 3.2 mmol/L — ABNORMAL LOW (ref 3.5–5.1)
Sodium: 139 mmol/L (ref 135–145)
Total Bilirubin: 1 mg/dL (ref 0.3–1.2)
Total Protein: 5.4 g/dL — ABNORMAL LOW (ref 6.5–8.1)

## 2020-10-17 LAB — C-REACTIVE PROTEIN: CRP: 8.6 mg/dL — ABNORMAL HIGH (ref <1.0)

## 2020-10-17 LAB — MAGNESIUM: Magnesium: 1.7 mg/dL (ref 1.7–2.4)

## 2020-10-17 MED ORDER — POTASSIUM CHLORIDE CRYS ER 20 MEQ PO TBCR
40.0000 meq | EXTENDED_RELEASE_TABLET | Freq: Once | ORAL | Status: AC
  Administered 2020-10-17: 40 meq via ORAL
  Filled 2020-10-17: qty 2

## 2020-10-17 MED ORDER — HYDRALAZINE HCL 25 MG PO TABS
25.0000 mg | ORAL_TABLET | Freq: Three times a day (TID) | ORAL | Status: DC
  Administered 2020-10-17: 25 mg via ORAL
  Filled 2020-10-17: qty 1

## 2020-10-17 MED ORDER — HYDRALAZINE HCL 25 MG PO TABS: 25.0000 mg | ORAL_TABLET | Freq: Three times a day (TID) | ORAL | 0 refills | Status: DC

## 2020-10-17 NOTE — Discharge Summary (Signed)
Physician Discharge Summary   Alexandria Newman  female DOB: April 14, 1951  NOB:[id number]  PCP: Tracie Harrier, MD  Admit date: 10/14/2020 Discharge date: 10/17/2020  Admitted From: home Disposition:  home CODE STATUS: Full code  Discharge Instructions     Discharge instructions   Complete by: As directed    Your gallstone pancreatitis has improved and you are tolerating eating.  Please follow up with surgery for outpatient cholecystectomy.  Your blood pressure has been high.  I have added a new blood pressure medication, hydralazine 25 mg three times a day.  Please follow up with your PCP for further adjustment.   Dr. Enzo Bi [facility] Course:  For full details, please see H&P, progress notes, consult notes and ancillary notes.  Briefly,  Alexandria Newman is a 70 y.o. female with medical history significant of anxiety/depression; dumping syndrome, gastric bypass; HLD; HTN; and obesity presenting with abdominal pain.   Cholecystitis, ruled out -Patient with several recent episodes of abdominal pain presenting with worsening RUQ/midepigastric pain --started on IV zosyn on presentation. -MRCP showed findings consistent with acute cholecystitis, however, HIDA neg for cholecystitis. --GenSurg consulted --zosyn d/c'ed, per [place], no further abx necessary.   Gallstone pancreatitis --lipase >5000 on presentation, started on aggressive IVF, already trended down to 200's after 2 days. --given pos covid, GenSurg will plan outpatient cholecystectomy with bariatrics. --pt was tolerating low-fat diet prior ot discharge.   COVID infection, asymptomatic -Patient with 3 immunizations  --no respiratory symptoms, No O2 requirement --completed Remdesivir for 3 day treatment   HTN -BP severely elevated --cont atenolol and Lisinopril --added hydralazine 25 mg TID   Mood d/o -on home Xanax, Wellbutrin, Celexa --cont home regimen   Obesity -s/p  gastric bypass -Body mass index is 30.25 kg/m.. -Outpatient PCP/bariatric medicine f/u encouraged    Discharge Diagnoses:  Principal Problem:   Acute cholecystitis Active Problems:   Hyperlipidemia   Hypertension, benign   Acute gallstone pancreatitis   COVID-19 virus infection   30 Day Unplanned Readmission Risk Score    Flowsheet Row ED to Hosp-Admission (Current) from 10/14/2020 in [facility]  30 Day Unplanned Readmission Risk Score (%) 14.16 Filed at 10/17/2020 0800       This score is the patient's risk of an unplanned readmission within 30 days of being discharged (0 -100%). The score is based on dignosis, age, lab data, medications, orders, and past utilization.   Low:  0-14.9   Medium: 15-21.9   High: 22-29.9   Extreme: 30 and above          Discharge Instructions:  Allergies as of 10/17/2020   No Known Allergies      Medication List     TAKE these medications    acetaminophen 325 MG tablet Commonly known as: TYLENOL Take 2 tablets (650 mg total) by mouth every 6 (six) hours as needed for mild pain (or Temp > 100).   ALPRAZolam 0.5 MG tablet Commonly known as: XANAX Take 0.5 mg by mouth 2 (two) times daily as needed for anxiety.   atenolol 25 MG tablet Commonly known as: TENORMIN Take 1 tablet (25 mg total) by mouth daily.   azelastine 0.1 % nasal spray Commonly known as: ASTELIN Place 1 spray into the nose 2 (two) times daily as needed for rhinitis or allergies.   buPROPion 150 MG 24 hr tablet Commonly known as: WELLBUTRIN XL TAKE 1 TABLET EVERY DAY  citalopram 40 MG tablet Commonly known as: CELEXA Take 40 mg by mouth daily.   cyanocobalamin 1000 MCG/ML injection Commonly known as: (VITAMIN B-12) Inject 1,000 mcg into the muscle every 30 (thirty) days.   dicyclomine 10 MG capsule Commonly known as: BENTYL Take 10 mg by mouth 3 (three) times daily.   DRY EYES OP Place 1 drop into both eyes daily as needed  (dry eyes).   fluticasone 50 MCG/ACT nasal spray Commonly known as: FLONASE Place 2 sprays into both nostrils daily as needed for allergies.   hydrALAZINE 25 MG tablet Commonly known as: APRESOLINE Take 1 tablet (25 mg total) by mouth every 8 (eight) hours.   lisinopril 30 MG tablet Commonly known as: ZESTRIL Take 30 mg by mouth daily.   Menthol-Camphor 16-11 % Crea Apply 1 application topically as needed (for neck and wrist pain).   pantoprazole 40 MG tablet Commonly known as: PROTONIX Take 40 mg by mouth daily.         Follow-up Information     Kinsinger, Arta Bruce, MD Follow up on 10/29/2020.   Specialty: General Surgery Why: 9:00am, arrive by 8:30am for paperwork and check in process, bring insurance card and photo ID Contact information: [place] [address] [phone]         Tracie Harrier, MD Follow up in 1 week(s).   Specialty: Internal Medicine Contact information: [address] [place] [address] [phone]                 No Known Allergies   The results of significant diagnostics from this hospitalization (including imaging, microbiology, ancillary and laboratory) are listed below for reference.   Consultations:   Procedures/Studies: CT ABDOMEN PELVIS WO CONTRAST  Result Date: 10/14/2020 CLINICAL DATA:  Left lower quadrant abdominal pain, fever EXAM: CT ABDOMEN AND PELVIS WITHOUT CONTRAST TECHNIQUE: Multidetector CT imaging of the abdomen and pelvis was performed following the standard protocol without IV contrast. COMPARISON:  01/04/2014 FINDINGS: Lower chest: Mild left basilar scarring is unchanged. Mild coronary artery calcification. Small pericardial effusion is unchanged, possibly physiologic. Hepatobiliary: Cholelithiasis noted. There is mild pericholecystic inflammatory stranding and possible trace pericholecystic fluid suggesting changes of superimposed acute  cholecystitis. The gallbladder, however, is not distended. The liver is unremarkable. No intra or extrahepatic biliary ductal dilation. Pancreas: There is mild peripancreatic inflammatory stranding in keeping with changes of acute pancreatitis. Pancreatic parenchymal viability is not well assessed on this noncontrast examination. The pancreatic duct is not dilated. No parenchymal calcifications are seen. No loculated peripancreatic fluid collections are identified. Spleen: Unremarkable Adrenals/Urinary Tract: The adrenal glands are unremarkable. The kidneys are normal. The bladder is unremarkable. 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 Partial small bowel resection has been performed with a surgical staple line noted within the distal small bowel within the right lower quadrant. Appendectomy has been performed. The stomach, small bowel, and large bowel are otherwise unremarkable. No free intraperitoneal gas or fluid. 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 Vascular/Lymphatic: Moderate aortoiliac atherosclerotic calcification. No aortic aneurysm. Reproductive: Status post hysterectomy. No adnexal masses. Other: No abdominal wall hernia identified. The rectum is unremarkable. Musculoskeletal: Degenerative changes are seen within the lumbar spine. No lytic or blastic bone lesions are seen. IMPRESSION: Mild peripancreatic inflammatory stranding suspicious for changes of acute pancreatitis. Correlation with serum lipase  and amylase would be helpful in confirming this finding. Mild pericholecystic inflammatory change. Superimposed cholelithiasis. The findings can be seen in setting of acute cholecystitis, though lack of gallbladder distension is unusual. Inflammatory changes could also reflect changes of cholangitis related to the  inflammatory process within the pancreas. Dedicated right upper quadrant sonography may be helpful for further evaluation. Aortic Atherosclerosis (ICD10-I70.0). Electronically Signed   By: Fidela Salisbury MD   On: 10/14/2020 23:42   NM Hepatobiliary Liver Func  Result Date: 10/16/2020 CLINICAL DATA:  Concern for cholecystitis. EXAM: NUCLEAR MEDICINE HEPATOBILIARY IMAGING TECHNIQUE: Sequential images of the abdomen were obtained out to 60 minutes following intravenous administration of radiopharmaceutical. RADIOPHARMACEUTICALS:  7.2 mCi Tc-20m  Choletec IV COMPARISON:  MRI October 15, 2020 and ultrasound October 15, 2020 FINDINGS: Slightly delayed clearance of radiotracer from the blood pool and hepatic parenchyma. Normal filling of the intrahepatic ducts, common bile duct. Gallbladder activity is visualized, consistent with patency of cystic duct (normal < 60 minutes). Additionally there is normal biliary to bowel transit (normal < 60 minutes), consistent with patent common bile duct. IMPRESSION: No scintigraphic evidence of acute cholecystitis. Slightly delayed clearance of radiotracer from the blood pool and hepatic parenchyma suggestive of mild hepatocellular dysfunction/hepatitis. Electronically Signed   By: Dahlia Bailiff MD   On: 10/16/2020 12:24   MR ABDOMEN MRCP WO CONTRAST  Result Date: 10/15/2020 CLINICAL DATA:  Abdominal pain, nausea, vomiting, jaundice, acute cholecystitis and choledocholithiasis EXAM: MRI ABDOMEN WITHOUT CONTRAST  (INCLUDING MRCP) TECHNIQUE: Multiplanar multisequence MR imaging of the abdomen was performed. Heavily T2-weighted images of the biliary and pancreatic ducts were obtained, and three-dimensional MRCP images were rendered by post processing. COMPARISON:  CT abdomen pelvis, 10/14/2020, right upper quadrant ultrasound, 10/15/2020 FINDINGS: Lower chest: No acute findings. Hepatobiliary: No mass or other noncontrast parenchymal abnormality identified. Mildly distended gallbladder  with gallbladder wall thickening and adjacent fat stranding. Small gallstones in the dependent gallbladder. No biliary ductal dilatation or filling defect to the ampulla. Pancreas: No noncontrast mass, inflammatory changes, or other parenchymal abnormality identified. No pancreatic ductal dilatation. Spleen:  Within normal limits in size and appearance. Adrenals/Urinary Tract: No masses identified. No evidence of hydronephrosis. Stomach/Bowel: Visualized portions within the abdomen are unremarkable. Vascular/Lymphatic: No pathologically enlarged lymph nodes identified. No abdominal aortic aneurysm demonstrated. Other:  None. Musculoskeletal: No suspicious bone lesions identified. IMPRESSION: 1. Mildly distended gallbladder with gallbladder wall thickening and adjacent fat stranding. Small gallstones in the dependent gallbladder. Findings are consistent with acute cholecystitis. 2. No evidence of choledocholithiasis. No biliary ductal dilatation or filling defect to the ampulla. Electronically Signed   By: Eddie Candle M.D.   On: 10/15/2020 09:47   MR 3D Recon At Scanner  Result Date: 10/15/2020 CLINICAL DATA:  Abdominal pain, nausea, vomiting, jaundice, acute cholecystitis and choledocholithiasis EXAM: MRI ABDOMEN WITHOUT CONTRAST  (INCLUDING MRCP) TECHNIQUE: Multiplanar multisequence MR imaging of the abdomen was performed. Heavily T2-weighted images of the biliary and pancreatic ducts were obtained, and three-dimensional MRCP images were rendered by post processing. COMPARISON:  CT abdomen pelvis, 10/14/2020, right upper quadrant ultrasound, 10/15/2020 FINDINGS: Lower chest: No acute findings. Hepatobiliary: No mass or other noncontrast parenchymal abnormality identified. Mildly distended gallbladder with gallbladder wall thickening and adjacent fat stranding. Small gallstones in the dependent gallbladder. No biliary ductal dilatation or filling defect to the ampulla. Pancreas: No noncontrast mass,  inflammatory changes, or other parenchymal abnormality identified. No pancreatic ductal dilatation. Spleen:  Within normal limits in size and appearance. Adrenals/Urinary Tract: No masses  identified. No evidence of hydronephrosis. Stomach/Bowel: Visualized portions within the abdomen are unremarkable. Vascular/Lymphatic: No pathologically enlarged lymph nodes identified. No abdominal aortic aneurysm demonstrated. Other:  None. Musculoskeletal: No suspicious bone lesions identified. IMPRESSION: 1. Mildly distended gallbladder with gallbladder wall thickening and adjacent fat stranding. Small gallstones in the dependent gallbladder. Findings are consistent with acute cholecystitis. 2. No evidence of choledocholithiasis. No biliary ductal dilatation or filling defect to the ampulla. Electronically Signed   By: Eddie Candle M.D.   On: 10/15/2020 09:47   US Abdomen Limited  Result Date: 10/15/2020 CLINICAL DATA:  Cholecystitis versus cholangitis. EXAM: ULTRASOUND ABDOMEN LIMITED RIGHT UPPER QUADRANT COMPARISON:  CT abdomen pelvis 10/14/2020 FINDINGS: Gallbladder: Multiple calcified gallstones within the gallbladder lumen with associated gallbladder wall thickening and pericholecystic fluid. No sonographic Murphy sign noted by sonographer. Common bile duct: Diameter: 4 mm Liver: No focal lesion identified. Within normal limits in parenchymal echogenicity. Portal vein is patent on color Doppler imaging with normal direction of blood flow towards the liver. Other: None. IMPRESSION: Cholelithiasis with acute cholecystitis. No findings suggest choledocholithiasis. Electronically Signed   By: Iven Finn M.D.   On: 10/15/2020 03:55      Labs: BNP (last 3 results) No results for input(s): BNP in the last 8760 hours. Basic Metabolic Panel: Recent Labs  Lab 10/14/20 2255 10/15/20 0750 10/16/20 0413 10/17/20 0236  NA 138 137 138 139  K 3.1* 4.2 3.5 3.2*  CL 105 104 104 106  CO2 25 25 25 28   GLUCOSE 125*  103* 70 80  BUN 7* 8 7* <5*  CREATININE 1.03* 0.89 0.77 0.72  CALCIUM 9.0 8.4* 8.6* 8.5*  MG  --  1.6* 2.1 1.7  PHOS  --  3.5  --   --    Liver Function Tests: Recent Labs  Lab 10/14/20 2255 10/15/20 0750 10/16/20 0413 10/17/20 0236  AST 156* 111* 62* 41  ALT 196* 149* 105* 79*  ALKPHOS 120 100 98 88  BILITOT 6.9* 4.2* 1.6* 1.0  PROT 6.2* 5.5* 5.5* 5.4*  ALBUMIN 3.4* 2.8* 2.7* 2.6*   Recent Labs  Lab 10/14/20 2255 10/16/20 0413  LIPASE 5,572* 262*   No results for input(s): AMMONIA in the last 168 hours. CBC: Recent Labs  Lab 10/14/20 2255 10/15/20 0750 10/16/20 0413 10/17/20 0236  WBC 15.7* 14.8* 8.0 6.1  NEUTROABS 14.2* 12.0*  --   --   HGB 12.3 10.7* 10.4* 10.5*  HCT 38.4 33.1* 32.4* 31.9*  MCV 93.0 92.2 91.0 89.4  PLT 240 185 188 185   Cardiac Enzymes: No results for input(s): CKTOTAL, CKMB, CKMBINDEX, TROPONINI in the last 168 hours. BNP: Invalid input(s): POCBNP CBG: No results for input(s): GLUCAP in the last 168 hours. D-Dimer Recent Labs    10/15/20 1020 10/16/20 0413  DDIMER 3.39* 2.46*   Hgb A1c No results for input(s): HGBA1C in the last 72 hours. Lipid Profile No results for input(s): CHOL, HDL, LDLCALC, TRIG, CHOLHDL, LDLDIRECT in the last 72 hours. Thyroid function studies No results for input(s): TSH, T4TOTAL, T3FREE, THYROIDAB in the last 72 hours.  Invalid input(s): FREET3 Anemia work up Recent Labs    10/15/20 1124 10/16/20 0413  FERRITIN 112 103   Urinalysis    Component Value Date/Time   COLORURINE AMBER (A) 10/15/2020 0750   APPEARANCEUR CLEAR 10/15/2020 0750   APPEARANCEUR Clear 03/01/2016 0840   LABSPEC 1.005 10/15/2020 0750   PHURINE 6.0 10/15/2020 0750   GLUCOSEU NEGATIVE 10/15/2020 0750   HGBUR SMALL (A) 10/15/2020  [place] 10/15/2020 0750   BILIRUBINUR Negative 03/01/2016 0840   KETONESUR NEGATIVE 10/15/2020 0750   PROTEINUR NEGATIVE 10/15/2020 0750   UROBILINOGEN 1.0 01/04/2014 1900    NITRITE NEGATIVE 10/15/2020 0750   LEUKOCYTESUR NEGATIVE 10/15/2020 0750   Sepsis Labs Invalid input(s): PROCALCITONIN,  WBC,  LACTICIDVEN Microbiology Recent Results (from the past 240 hour(s))  Culture, blood (routine x 2)     Status: None (Preliminary result)   Collection Time: 10/14/20 10:46 PM   Specimen: BLOOD LEFT ARM  Result Value Ref Range Status   Specimen Description BLOOD LEFT ARM  Final   Special Requests   Final    BOTTLES DRAWN AEROBIC ONLY Blood Culture adequate volume   Culture   Final    NO GROWTH 2 DAYS Performed at [facility], [place] [address], [place], [place] [address]    Report Status PENDING  Incomplete  Culture, blood (routine x 2)     Status: None (Preliminary result)   Collection Time: 10/14/20 10:55 PM   Specimen: BLOOD RIGHT ARM  Result Value Ref Range Status   Specimen Description BLOOD RIGHT ARM  Final   Special Requests   Final    BOTTLES DRAWN AEROBIC AND ANAEROBIC Blood Culture adequate volume   Culture   Final    NO GROWTH 2 DAYS Performed at [facility], [place] [address] [place], [place] [address]    Report Status PENDING  Incomplete  Resp Panel by RT-PCR (Flu A&B, Covid) Nasopharyngeal Swab     Status: Abnormal   Collection Time: 10/15/20  6:50 AM   Specimen: Nasopharyngeal Swab; Nasopharyngeal(NP) swabs in vial transport medium  Result Value Ref Range Status   SARS Coronavirus 2 by RT PCR POSITIVE (A) NEGATIVE Final    Comment: RESULT CALLED TO, READ BACK BY AND VERIFIED WITH: RN S.NEWSOME AT [id number] ON 10/15/2020 BY T.SAAD. (NOTE) SARS-CoV-2 target nucleic acids are DETECTED.  The SARS-CoV-2 RNA is generally detectable in upper respiratory specimens during the acute phase of infection. Positive results are indicative of the presence of the identified virus, but do not rule out bacterial infection or co-infection with other pathogens not detected by the test. Clinical correlation with patient history and other diagnostic  information is necessary to determine patient infection status. The expected result is Negative.  Fact Sheet for Patients: EntrepreneurPulse.com.au  Fact Sheet for Healthcare Providers: IncredibleEmployment.be  This test is not yet approved or cleared by the [place] FDA and  has been authorized for detection and/or diagnosis of SARS-CoV-2 by FDA under an Emergency Use Authorization (EUA).  This EUA will remain in effect (meaning this  test can be used) for the duration of  the COVID-19 declaration under Section 564(b)(1) of the Act, 21 U.S.C. section 360bbb-3(b)(1), unless the authorization is terminated or revoked sooner.     Influenza A by PCR NEGATIVE NEGATIVE Final   Influenza B by PCR NEGATIVE NEGATIVE Final    Comment: (NOTE) The Xpert Xpress SARS-CoV-2/FLU/RSV plus assay is intended as an aid in the diagnosis of influenza from Nasopharyngeal swab specimens and should not be used as a sole basis for treatment. Nasal washings and aspirates are unacceptable for Xpert Xpress SARS-CoV-2/FLU/RSV testing.  Fact Sheet for Patients: EntrepreneurPulse.com.au  Fact Sheet for Healthcare Providers: IncredibleEmployment.be  This test is not yet approved or cleared by the [place] FDA and has been authorized for detection and/or diagnosis of SARS-CoV-2 by FDA under an Emergency Use Authorization (EUA). This EUA  will remain in effect (meaning this test can be used) for the duration of the COVID-19 declaration under Section 564(b)(1) of the Act, 21 U.S.C. section 360bbb-3(b)(1), unless the authorization is terminated or revoked.  Performed at [facility], [place] [address], [place], [place] [address]   Culture, Urine     Status: Abnormal   Collection Time: 10/15/20  7:50 AM   Specimen: Urine, Random  Result Value Ref Range Status   Specimen Description URINE, RANDOM  Final   Special Requests  NONE  Final   Culture (A)  Final    <10,000 COLONIES/mL INSIGNIFICANT GROWTH Performed at [facility], [place] [address] [place], [place] [address]    Report Status 10/16/2020 FINAL  Final     Total time spend on discharging this patient, including the last patient exam, discussing the hospital stay, instructions for ongoing care as it relates to all pertinent caregivers, as well as preparing the medical discharge records, prescriptions, and/or referrals as applicable, is 35 minutes.    Enzo Bi, MD  [facility] 10/17/2020, 11:58 AM

## 2020-10-17 NOTE — Plan of Care (Signed)

## 2020-10-17 NOTE — Progress Notes (Signed)
Subjective/Chief Complaint: No complaints. Wants more food   Objective: Vital signs in last 24 hours: Temp:  [98 F (36.7 C)-98.4 F (36.9 C)] 98.4 F (36.9 C) (06/25 0532) Pulse Rate:  [56-62] 57 (06/25 0532) Resp:  [16-22] 17 (06/25 0532) BP: (170-212)/(60-74) 178/73 (06/25 0532) SpO2:  [94 %-99 %] 94 % (06/25 0532) Last BM Date: 10/15/20  Intake/Output from previous day: 06/24 0701 - 06/25 0700 In: 1484.7 [P.O.:480; I.V.:884; IV Piggyback:120.8] Out: -  Intake/Output this shift: No intake/output data recorded.  General appearance: alert and cooperative Resp: clear to auscultation bilaterally Cardio: regular rate and rhythm GI: soft, nontender  Lab Results:  Recent Labs    10/16/20 0413 10/17/20 0236  WBC 8.0 6.1  HGB 10.4* 10.5*  HCT 32.4* 31.9*  PLT 188 185   BMET Recent Labs    10/16/20 0413 10/17/20 0236  NA 138 139  K 3.5 3.2*  CL 104 106  CO2 25 28  GLUCOSE 70 80  BUN 7* <5*  CREATININE 0.77 0.72  CALCIUM 8.6* 8.5*   PT/INR No results for input(s): LABPROT, INR in the last 72 hours. ABG No results for input(s): PHART, HCO3 in the last 72 hours.  Invalid input(s): PCO2, PO2  Studies/Results: NM Hepatobiliary Liver Func  Result Date: 10/16/2020 CLINICAL DATA:  Concern for cholecystitis. EXAM: NUCLEAR MEDICINE HEPATOBILIARY IMAGING TECHNIQUE: Sequential images of the abdomen were obtained out to 60 minutes following intravenous administration of radiopharmaceutical. RADIOPHARMACEUTICALS:  7.2 mCi Tc-25m  Choletec IV COMPARISON:  MRI October 15, 2020 and ultrasound October 15, 2020 FINDINGS: Slightly delayed clearance of radiotracer from the blood pool and hepatic parenchyma. Normal filling of the intrahepatic ducts, common bile duct. Gallbladder activity is visualized, consistent with patency of cystic duct (normal < 60 minutes). Additionally there is normal biliary to bowel transit (normal < 60 minutes), consistent with patent common bile duct.  IMPRESSION: No scintigraphic evidence of acute cholecystitis. Slightly delayed clearance of radiotracer from the blood pool and hepatic parenchyma suggestive of mild hepatocellular dysfunction/hepatitis. Electronically Signed   By: Dahlia Bailiff MD   On: 10/16/2020 12:24   MR ABDOMEN MRCP WO CONTRAST  Result Date: 10/15/2020 CLINICAL DATA:  Abdominal pain, nausea, vomiting, jaundice, acute cholecystitis and choledocholithiasis EXAM: MRI ABDOMEN WITHOUT CONTRAST  (INCLUDING MRCP) TECHNIQUE: Multiplanar multisequence MR imaging of the abdomen was performed. Heavily T2-weighted images of the biliary and pancreatic ducts were obtained, and three-dimensional MRCP images were rendered by post processing. COMPARISON:  CT abdomen pelvis, 10/14/2020, right upper quadrant ultrasound, 10/15/2020 FINDINGS: Lower chest: No acute findings. Hepatobiliary: No mass or other noncontrast parenchymal abnormality identified. Mildly distended gallbladder with gallbladder wall thickening and adjacent fat stranding. Small gallstones in the dependent gallbladder. No biliary ductal dilatation or filling defect to the ampulla. Pancreas: No noncontrast mass, inflammatory changes, or other parenchymal abnormality identified. No pancreatic ductal dilatation. Spleen:  Within normal limits in size and appearance. Adrenals/Urinary Tract: No masses identified. No evidence of hydronephrosis. Stomach/Bowel: Visualized portions within the abdomen are unremarkable. Vascular/Lymphatic: No pathologically enlarged lymph nodes identified. No abdominal aortic aneurysm demonstrated. Other:  None. Musculoskeletal: No suspicious bone lesions identified. IMPRESSION: 1. Mildly distended gallbladder with gallbladder wall thickening and adjacent fat stranding. Small gallstones in the dependent gallbladder. Findings are consistent with acute cholecystitis. 2. No evidence of choledocholithiasis. No biliary ductal dilatation or filling defect to the ampulla.  Electronically Signed   By: Eddie Candle M.D.   On: 10/15/2020 09:47   MR 3D Recon At Scanner  Result Date: 10/15/2020 CLINICAL DATA:  Abdominal pain, nausea, vomiting, jaundice, acute cholecystitis and choledocholithiasis EXAM: MRI ABDOMEN WITHOUT CONTRAST  (INCLUDING MRCP) TECHNIQUE: Multiplanar multisequence MR imaging of the abdomen was performed. Heavily T2-weighted images of the biliary and pancreatic ducts were obtained, and three-dimensional MRCP images were rendered by post processing. COMPARISON:  CT abdomen pelvis, 10/14/2020, right upper quadrant ultrasound, 10/15/2020 FINDINGS: Lower chest: No acute findings. Hepatobiliary: No mass or other noncontrast parenchymal abnormality identified. Mildly distended gallbladder with gallbladder wall thickening and adjacent fat stranding. Small gallstones in the dependent gallbladder. No biliary ductal dilatation or filling defect to the ampulla. Pancreas: No noncontrast mass, inflammatory changes, or other parenchymal abnormality identified. No pancreatic ductal dilatation. Spleen:  Within normal limits in size and appearance. Adrenals/Urinary Tract: No masses identified. No evidence of hydronephrosis. Stomach/Bowel: Visualized portions within the abdomen are unremarkable. Vascular/Lymphatic: No pathologically enlarged lymph nodes identified. No abdominal aortic aneurysm demonstrated. Other:  None. Musculoskeletal: No suspicious bone lesions identified. IMPRESSION: 1. Mildly distended gallbladder with gallbladder wall thickening and adjacent fat stranding. Small gallstones in the dependent gallbladder. Findings are consistent with acute cholecystitis. 2. No evidence of choledocholithiasis. No biliary ductal dilatation or filling defect to the ampulla. Electronically Signed   By: Eddie Candle M.D.   On: 10/15/2020 09:47    Anti-infectives: Anti-infectives (From admission, onward)    Start     Dose/Rate Route Frequency Ordered Stop   10/16/20 1000   remdesivir 100 mg in sodium chloride 0.9 % 100 mL IVPB       See Hyperspace for full Linked Orders Report.   100 mg 200 mL/hr over 30 Minutes Intravenous Daily 10/15/20 0945 10/18/20 0959   10/15/20 1100  remdesivir 200 mg in sodium chloride 0.9% 250 mL IVPB       See Hyperspace for full Linked Orders Report.   200 mg 580 mL/hr over 30 Minutes Intravenous Once 10/15/20 0945 10/15/20 1239   10/15/20 0800  piperacillin-tazobactam (ZOSYN) IVPB 3.375 g  Status:  Discontinued        3.375 g 12.5 mL/hr over 240 Minutes Intravenous Every 8 hours 10/15/20 0650 10/16/20 1620   10/15/20 0630  metroNIDAZOLE (FLAGYL) IVPB 500 mg        500 mg 100 mL/hr over 60 Minutes Intravenous  Once 10/15/20 0624 10/15/20 0901   10/15/20 0600  cefTRIAXone (ROCEPHIN) 2 g in sodium chloride 0.9 % 100 mL IVPB        2 g 200 mL/hr over 30 Minutes Intravenous  Once 10/15/20 0557 10/15/20 0655   10/14/20 2245  cephALEXin (KEFLEX) capsule 500 mg        500 mg Oral  Once 10/14/20 2241 10/14/20 2251       Assessment/Plan: s/p * No surgery found * Advance diet Gallstone pancreatitis seems to be resolving. Given that she is covid + and improving, will plan for interval cholecystectomy D/c when medically stable and follow up with [place] in next 2 weeks to set up definitive surgery.  LOS: 2 days    Autumn Messing III 10/17/2020

## 2020-10-20 DIAGNOSIS — Z09 Encounter for follow-up examination after completed treatment for conditions other than malignant neoplasm: Secondary | ICD-10-CM | POA: Diagnosis not present

## 2020-10-20 DIAGNOSIS — K810 Acute cholecystitis: Secondary | ICD-10-CM | POA: Diagnosis not present

## 2020-10-20 DIAGNOSIS — F3341 Major depressive disorder, recurrent, in partial remission: Secondary | ICD-10-CM | POA: Diagnosis not present

## 2020-10-20 DIAGNOSIS — K851 Biliary acute pancreatitis without necrosis or infection: Secondary | ICD-10-CM | POA: Diagnosis not present

## 2020-10-20 DIAGNOSIS — I10 Essential (primary) hypertension: Secondary | ICD-10-CM | POA: Diagnosis not present

## 2020-10-20 LAB — CULTURE, BLOOD (ROUTINE X 2)
Culture: NO GROWTH
Culture: NO GROWTH
Special Requests: ADEQUATE
Special Requests: ADEQUATE

## 2020-10-29 DIAGNOSIS — Z9884 Bariatric surgery status: Secondary | ICD-10-CM | POA: Diagnosis not present

## 2020-10-29 DIAGNOSIS — K851 Biliary acute pancreatitis without necrosis or infection: Secondary | ICD-10-CM | POA: Diagnosis not present

## 2020-11-06 ENCOUNTER — Ambulatory Visit: Payer: Self-pay | Admitting: General Surgery

## 2020-11-06 ENCOUNTER — Other Ambulatory Visit: Payer: Self-pay

## 2020-11-06 ENCOUNTER — Encounter (HOSPITAL_COMMUNITY): Payer: Self-pay | Admitting: General Surgery

## 2020-11-06 NOTE — Progress Notes (Addendum)
COVID Vaccine Completed:  Yes x3 Date COVID Vaccine completed: Has received booster: COVID vaccine manufacturer: Pfizer      Date of COVID positive in last 90 days:  10-15-20 in Epic  PCP - Tracie Harrier, MD Cardiologist - Bartholome Bill, MD  Chest x-ray - N/A EKG - 12-03-19 Care Everywhere.  On chart Stress Test - 2019 Care Everywhere ECHO - 2019 Care Everywhere Cardiac Cath -  Pacemaker/ICD device last checked: Spinal Cord Stimulator:  Sleep Study - N/A CPAP -   Fasting Blood Sugar - N/A Checks Blood Sugar _____ times a day  Blood Thinner Instructions:  NA Aspirin Instructions: Last Dose:  Activity level:  Can go up a flight of stairs and perform activities of daily living without stopping and without symptoms of chest pain or shortness of breath.  Able to exercise without symptoms      Anesthesia review:  Hx of chest pain evaluated by cardiology.  Patient states no chest pain or tightness in many months.  HTN  Patient denies shortness of breath, fever, cough and chest pain at PAT appointment (completed over the phone)   Patient verbalized understanding of instructions that were given to them at the PAT appointment. Patient was also instructed that they will need to review over the PAT instructions again at home before surgery.

## 2020-11-06 NOTE — Progress Notes (Signed)
Requested orders with Abigail Butts at Dr. Amie [place] office.

## 2020-11-10 ENCOUNTER — Ambulatory Visit (HOSPITAL_COMMUNITY)
Admission: RE | Admit: 2020-11-10 | Discharge: 2020-11-10 | Disposition: A | Discharge status: Home / Self Care | Payer: Medicare HMO | Attending: General Surgery | Admitting: General Surgery

## 2020-11-10 ENCOUNTER — Ambulatory Visit (HOSPITAL_COMMUNITY): Payer: Medicare HMO | Admitting: Physician Assistant

## 2020-11-10 ENCOUNTER — Encounter (HOSPITAL_COMMUNITY): Payer: Self-pay | Admitting: General Surgery

## 2020-11-10 ENCOUNTER — Encounter (HOSPITAL_COMMUNITY)
Admission: RE | Disposition: A | Discharge status: Home / Self Care | Payer: Self-pay | Source: Home / Self Care | Attending: General Surgery

## 2020-11-10 ENCOUNTER — Ambulatory Visit (HOSPITAL_COMMUNITY): Payer: Medicare HMO

## 2020-11-10 DIAGNOSIS — Z87442 Personal history of urinary calculi: Secondary | ICD-10-CM | POA: Diagnosis not present

## 2020-11-10 DIAGNOSIS — Z9884 Bariatric surgery status: Secondary | ICD-10-CM | POA: Insufficient documentation

## 2020-11-10 DIAGNOSIS — I10 Essential (primary) hypertension: Secondary | ICD-10-CM | POA: Diagnosis not present

## 2020-11-10 DIAGNOSIS — Z8616 Personal history of COVID-19: Secondary | ICD-10-CM | POA: Insufficient documentation

## 2020-11-10 DIAGNOSIS — E785 Hyperlipidemia, unspecified: Secondary | ICD-10-CM | POA: Diagnosis not present

## 2020-11-10 DIAGNOSIS — K8010 Calculus of gallbladder with chronic cholecystitis without obstruction: Secondary | ICD-10-CM | POA: Insufficient documentation

## 2020-11-10 DIAGNOSIS — Z419 Encounter for procedure for purposes other than remedying health state, unspecified: Secondary | ICD-10-CM

## 2020-11-10 DIAGNOSIS — E559 Vitamin D deficiency, unspecified: Secondary | ICD-10-CM | POA: Diagnosis not present

## 2020-11-10 DIAGNOSIS — D509 Iron deficiency anemia, unspecified: Secondary | ICD-10-CM | POA: Diagnosis not present

## 2020-11-10 DIAGNOSIS — K8020 Calculus of gallbladder without cholecystitis without obstruction: Secondary | ICD-10-CM | POA: Diagnosis not present

## 2020-11-10 DIAGNOSIS — Z79899 Other long term (current) drug therapy: Secondary | ICD-10-CM | POA: Insufficient documentation

## 2020-11-10 HISTORY — DX: COVID-19: U07.1

## 2020-11-10 HISTORY — PX: LAPAROSCOPIC CHOLECYSTECTOMY SINGLE SITE WITH INTRAOPERATIVE CHOLANGIOGRAM: SHX6538

## 2020-11-10 HISTORY — DX: Personal history of urinary calculi: Z87.442

## 2020-11-10 SURGERY — LAPAROSCOPIC CHOLECYSTECTOMY SINGLE SITE WITH INTRAOPERATIVE CHOLANGIOGRAM
Anesthesia: General | Site: Abdomen

## 2020-11-10 MED ORDER — SODIUM CHLORIDE 0.9 % IV SOLN
2.0000 g | INTRAVENOUS | Status: AC
  Administered 2020-11-10: 2 g via INTRAVENOUS
  Filled 2020-11-10: qty 2

## 2020-11-10 MED ORDER — ONDANSETRON HCL 4 MG/2ML IJ SOLN
INTRAMUSCULAR | Status: AC
  Filled 2020-11-10: qty 2

## 2020-11-10 MED ORDER — EPHEDRINE SULFATE-NACL 50-0.9 MG/10ML-% IV SOSY
PREFILLED_SYRINGE | INTRAVENOUS | Status: DC | PRN
  Administered 2020-11-10: 10 mg via INTRAVENOUS

## 2020-11-10 MED ORDER — LABETALOL HCL 5 MG/ML IV SOLN
10.0000 mg | Freq: Once | INTRAVENOUS | Status: AC
  Administered 2020-11-10: 10 mg via INTRAVENOUS

## 2020-11-10 MED ORDER — FENTANYL CITRATE (PF) 250 MCG/5ML IJ SOLN
INTRAMUSCULAR | Status: AC
  Filled 2020-11-10: qty 5

## 2020-11-10 MED ORDER — CHLORHEXIDINE GLUCONATE 0.12 % MT SOLN
15.0000 mL | Freq: Once | OROMUCOSAL | Status: AC
  Administered 2020-11-10: 15 mL via OROMUCOSAL

## 2020-11-10 MED ORDER — 0.9 % SODIUM CHLORIDE (POUR BTL) OPTIME
TOPICAL | Status: DC | PRN
  Administered 2020-11-10: 1000 mL

## 2020-11-10 MED ORDER — LABETALOL HCL 5 MG/ML IV SOLN
INTRAVENOUS | Status: AC
  Administered 2020-11-10: 5 mg via INTRAVENOUS
  Filled 2020-11-10: qty 4

## 2020-11-10 MED ORDER — OXYCODONE HCL 5 MG PO TABS: 5.0000 mg | ORAL_TABLET | Freq: Four times a day (QID) | ORAL | 0 refills | Status: DC | PRN

## 2020-11-10 MED ORDER — DEXAMETHASONE SODIUM PHOSPHATE 10 MG/ML IJ SOLN
INTRAMUSCULAR | Status: DC | PRN
  Administered 2020-11-10: 5 mg via INTRAVENOUS

## 2020-11-10 MED ORDER — ORAL CARE MOUTH RINSE: 15.0000 mL | Freq: Once | OROMUCOSAL | Status: AC

## 2020-11-10 MED ORDER — LIDOCAINE 2% (20 MG/ML) 5 ML SYRINGE
INTRAMUSCULAR | Status: AC
  Filled 2020-11-10: qty 5

## 2020-11-10 MED ORDER — CHLORHEXIDINE GLUCONATE CLOTH 2 % EX PADS: 6.0000 | MEDICATED_PAD | Freq: Once | CUTANEOUS | Status: DC

## 2020-11-10 MED ORDER — MIDAZOLAM HCL 5 MG/5ML IJ SOLN
INTRAMUSCULAR | Status: DC | PRN
Start: 2020-11-10 — End: 2020-11-10
  Administered 2020-11-10: 2 mg via INTRAVENOUS

## 2020-11-10 MED ORDER — OXYCODONE HCL 5 MG/5ML PO SOLN
5.0000 mg | Freq: Once | ORAL | Status: DC | PRN
Start: 2020-11-10 — End: 2020-11-10

## 2020-11-10 MED ORDER — CELECOXIB 200 MG PO CAPS
400.0000 mg | ORAL_CAPSULE | ORAL | Status: AC
  Administered 2020-11-10: 400 mg via ORAL
  Filled 2020-11-10: qty 2

## 2020-11-10 MED ORDER — LABETALOL HCL 5 MG/ML IV SOLN
5.0000 mg | INTRAVENOUS | Status: AC | PRN
  Administered 2020-11-10: 5 mg via INTRAVENOUS

## 2020-11-10 MED ORDER — LIDOCAINE 2% (20 MG/ML) 5 ML SYRINGE
INTRAMUSCULAR | Status: DC | PRN
Start: 2020-11-10 — End: 2020-11-10
  Administered 2020-11-10: 100 mg via INTRAVENOUS

## 2020-11-10 MED ORDER — DEXAMETHASONE SODIUM PHOSPHATE 10 MG/ML IJ SOLN
INTRAMUSCULAR | Status: AC
  Filled 2020-11-10: qty 1

## 2020-11-10 MED ORDER — BUPIVACAINE-EPINEPHRINE (PF) 0.25% -1:200000 IJ SOLN
INTRAMUSCULAR | Status: AC
  Filled 2020-11-10: qty 30

## 2020-11-10 MED ORDER — ACETAMINOPHEN 500 MG PO TABS
1000.0000 mg | ORAL_TABLET | ORAL | Status: AC
  Administered 2020-11-10: 1000 mg via ORAL
  Filled 2020-11-10: qty 2

## 2020-11-10 MED ORDER — FENTANYL CITRATE (PF) 250 MCG/5ML IJ SOLN
INTRAMUSCULAR | Status: DC | PRN
  Administered 2020-11-10 (×2): 25 ug via INTRAVENOUS
  Administered 2020-11-10 (×2): 50 ug via INTRAVENOUS
  Administered 2020-11-10: 100 ug via INTRAVENOUS

## 2020-11-10 MED ORDER — LACTATED RINGERS IV SOLN: INTRAVENOUS | Status: DC

## 2020-11-10 MED ORDER — ROCURONIUM BROMIDE 10 MG/ML (PF) SYRINGE
PREFILLED_SYRINGE | INTRAVENOUS | Status: AC
  Filled 2020-11-10: qty 10

## 2020-11-10 MED ORDER — PROMETHAZINE HCL 25 MG/ML IJ SOLN
INTRAMUSCULAR | Status: AC
  Administered 2020-11-10: 6.25 mg via INTRAVENOUS
  Filled 2020-11-10: qty 1

## 2020-11-10 MED ORDER — MIDAZOLAM HCL 2 MG/2ML IJ SOLN
INTRAMUSCULAR | Status: AC
  Filled 2020-11-10: qty 2

## 2020-11-10 MED ORDER — PROPOFOL 10 MG/ML IV BOLUS
INTRAVENOUS | Status: DC | PRN
  Administered 2020-11-10: 140 mg via INTRAVENOUS
  Administered 2020-11-10: 60 mg via INTRAVENOUS

## 2020-11-10 MED ORDER — FENTANYL CITRATE (PF) 100 MCG/2ML IJ SOLN: 25.0000 ug | INTRAMUSCULAR | Status: DC | PRN

## 2020-11-10 MED ORDER — BUPIVACAINE-EPINEPHRINE 0.25% -1:200000 IJ SOLN
INTRAMUSCULAR | Status: DC | PRN
  Administered 2020-11-10: 20 mL

## 2020-11-10 MED ORDER — SUGAMMADEX SODIUM 500 MG/5ML IV SOLN
INTRAVENOUS | Status: DC | PRN
  Administered 2020-11-10: 400 mg via INTRAVENOUS

## 2020-11-10 MED ORDER — PROMETHAZINE HCL 25 MG/ML IJ SOLN: 6.2500 mg | INTRAMUSCULAR | Status: DC | PRN

## 2020-11-10 MED ORDER — IOHEXOL 300 MG/ML  SOLN
INTRAMUSCULAR | Status: DC | PRN
  Administered 2020-11-10: 11 mL

## 2020-11-10 MED ORDER — LACTATED RINGERS IR SOLN
Status: DC | PRN
  Administered 2020-11-10: 1000 mL

## 2020-11-10 MED ORDER — OXYCODONE HCL 5 MG PO TABS
5.0000 mg | ORAL_TABLET | Freq: Once | ORAL | Status: DC | PRN
Start: 2020-11-10 — End: 2020-11-10

## 2020-11-10 MED ORDER — ROCURONIUM BROMIDE 10 MG/ML (PF) SYRINGE
PREFILLED_SYRINGE | INTRAVENOUS | Status: DC | PRN
  Administered 2020-11-10: 50 mg via INTRAVENOUS
  Administered 2020-11-10: 20 mg via INTRAVENOUS

## 2020-11-10 SURGICAL SUPPLY — 41 items
APPLIER CLIP 5 13 M/L LIGAMAX5 (MISCELLANEOUS)
APPLIER CLIP ROT 10 11.4 M/L (STAPLE)
BAG COUNTER SPONGE SURGICOUNT (BAG) IMPLANT
BENZOIN TINCTURE PRP APPL 2/3 (GAUZE/BANDAGES/DRESSINGS) ×2 IMPLANT
BNDG ADH 1X3 SHEER STRL LF (GAUZE/BANDAGES/DRESSINGS) ×8 IMPLANT
CABLE HIGH FREQUENCY MONO STRZ (ELECTRODE) ×2 IMPLANT
CHLORAPREP W/TINT 26 (MISCELLANEOUS) ×2 IMPLANT
CLIP APPLIE 5 13 M/L LIGAMAX5 (MISCELLANEOUS) IMPLANT
CLIP APPLIE ROT 10 11.4 M/L (STAPLE) IMPLANT
CLIP VESOLOCK LG 6/CT PURPLE (CLIP) IMPLANT
CLIP VESOLOCK MED LG 6/CT (CLIP) IMPLANT
COVER MAYO STAND STRL (DRAPES) ×2 IMPLANT
COVER SURGICAL LIGHT HANDLE (MISCELLANEOUS) ×2 IMPLANT
DECANTER SPIKE VIAL GLASS SM (MISCELLANEOUS) ×2 IMPLANT
DRAIN CHANNEL 19F RND (DRAIN) IMPLANT
DRAPE C-ARM 42X120 X-RAY (DRAPES) IMPLANT
EVACUATOR SILICONE 100CC (DRAIN) IMPLANT
GLOVE SURG POLYISO LF SZ7 (GLOVE) ×2 IMPLANT
GLOVE SURG UNDER POLY LF SZ7 (GLOVE) ×2 IMPLANT
GOWN STRL REUS W/TWL LRG LVL3 (GOWN DISPOSABLE) ×2 IMPLANT
GOWN STRL REUS W/TWL XL LVL3 (GOWN DISPOSABLE) ×4 IMPLANT
GRASPER SUT TROCAR 14GX15 (MISCELLANEOUS) IMPLANT
KIT BASIN OR (CUSTOM PROCEDURE TRAY) ×2 IMPLANT
KIT TURNOVER KIT A (KITS) ×2 IMPLANT
POUCH RETRIEVAL ECOSAC 10 (ENDOMECHANICALS) ×1 IMPLANT
POUCH RETRIEVAL ECOSAC 10MM (ENDOMECHANICALS) ×2
SCISSORS LAP 5X35 DISP (ENDOMECHANICALS) ×2 IMPLANT
SET CHOLANGIOGRAPH MIX (MISCELLANEOUS) IMPLANT
SET IRRIG TUBING LAPAROSCOPIC (IRRIGATION / IRRIGATOR) ×2 IMPLANT
SET TUBE SMOKE EVAC HIGH FLOW (TUBING) ×2 IMPLANT
SLEEVE XCEL OPT CAN 5 100 (ENDOMECHANICALS) ×4 IMPLANT
STOPCOCK 4 WAY LG BORE MALE ST (IV SETS) IMPLANT
STRIP CLOSURE SKIN 1/2X4 (GAUZE/BANDAGES/DRESSINGS) ×2 IMPLANT
SUT ETHILON 2 0 PS N (SUTURE) IMPLANT
SUT MNCRL AB 4-0 PS2 18 (SUTURE) ×2 IMPLANT
SUT VICRYL 0 ENDOLOOP (SUTURE) IMPLANT
TOWEL OR 17X26 10 PK STRL BLUE (TOWEL DISPOSABLE) ×2 IMPLANT
TOWEL OR NON WOVEN STRL DISP B (DISPOSABLE) IMPLANT
TRAY LAPAROSCOPIC (CUSTOM PROCEDURE TRAY) ×2 IMPLANT
TROCAR BLADELESS OPT 5 100 (ENDOMECHANICALS) ×2 IMPLANT
TROCAR XCEL NON-BLD 11X100MML (ENDOMECHANICALS) ×2 IMPLANT

## 2020-11-10 NOTE — Anesthesia Postprocedure Evaluation (Signed)
Anesthesia Post Note  Patient: Alexandria Newman  Procedure(s) Performed: LAPAROSCOPIC CHOLECYSTECTOMY SINGLE SITE WITH INTRAOPERATIVE CHOLANGIOGRAM (Abdomen)     Patient location during evaluation: PACU Anesthesia Type: General Level of consciousness: awake and alert and oriented Pain management: pain level controlled Vital Signs Assessment: post-procedure vital signs reviewed and stable Respiratory status: spontaneous breathing, nonlabored ventilation and respiratory function stable Cardiovascular status: blood pressure returned to baseline Postop Assessment: no apparent nausea or vomiting Anesthetic complications: no   No notable events documented.  Last Vitals:  Vitals:   11/10/20 1545 11/10/20 1600  BP: (!) 174/84 (!) 172/81  Pulse: 68 65  Resp: 19 (!) 21  Temp:    SpO2: 95% 94%    Last Pain:  Vitals:   11/10/20 1545  TempSrc:   PainSc: 0-No pain                 Brennan Bailey

## 2020-11-10 NOTE — Anesthesia Procedure Notes (Signed)
Procedure Name: Intubation Date/Time: 11/10/2020 12:57 PM Performed by: Lollie Sails, CRNA Pre-anesthesia Checklist: Patient identified, Emergency Drugs available, Suction available, Patient being monitored and Timeout performed Patient Re-evaluated:Patient Re-evaluated prior to induction Oxygen Delivery Method: Circle system utilized Preoxygenation: Pre-oxygenation with 100% oxygen Induction Type: IV induction Ventilation: Mask ventilation without difficulty Laryngoscope Size: Mac and 4 Grade View: Grade I Tube type: Oral Tube size: 7.0 mm Number of attempts: 1 Airway Equipment and Method: Stylet Placement Confirmation: ETT inserted through vocal cords under direct vision, positive ETCO2 and breath sounds checked- equal and bilateral Secured at: 22 cm Tube secured with: Tape Dental Injury: Teeth and Oropharynx as per pre-operative assessment

## 2020-11-10 NOTE — Transfer of Care (Signed)
Immediate Anesthesia Transfer of Care Note  Patient: Alexandria Newman  Procedure(s) Performed: LAPAROSCOPIC CHOLECYSTECTOMY SINGLE SITE WITH INTRAOPERATIVE CHOLANGIOGRAM (Abdomen)  Patient Location: PACU  Anesthesia Type:General  Level of Consciousness: awake, drowsy and responds to stimulation  Airway & Oxygen Therapy: Patient Spontanous Breathing and Patient connected to face mask oxygen  Post-op Assessment: Report given to RN and Post -op Vital signs reviewed and stable  Post vital signs: Reviewed and stable  Last Vitals:  Vitals Value Taken Time  BP 197/87 11/10/20 1419  Temp    Pulse 93 11/10/20 1422  Resp 25 11/10/20 1422  SpO2 98 % 11/10/20 1422  Vitals shown include unvalidated device data.  Last Pain:  Vitals:   11/10/20 1131  TempSrc:   PainSc: 0-No pain      Patients Stated Pain Goal: 3 (79/15/05 6979)  Complications: No notable events documented.

## 2020-11-10 NOTE — H&P (Signed)
Alexandria Newman is an 71 y.o. female.   Chief Complaint: abdominal pain HPI: 70 yo female was in the ED with abdominal pain found to have gallstones and pancreatitis. She presents for gallbladder surgery.  Past Medical History:  Diagnosis Date   Anemia    Anxiety    B12 deficiency    Chronic insomnia    COVID-19    10-15-20   Degeneration of cervical intervertebral disc    Depression    Dumping syndrome 2016   per pt had "part of intestines removed"   Dysfunction of both eustachian tubes    History of kidney stones    Hypercholesteremia    Hypertension    Metabolic syndrome    Obesity    Osteoarthritis of right hand    Ovarian failure    TMJ (dislocation of temporomandibular joint)    Vitamin D deficiency     Past Surgical History:  Procedure Laterality Date   ABDOMINAL HYSTERECTOMY     APPENDECTOMY     COLONOSCOPY WITH PROPOFOL N/A 03/21/2016   Procedure: COLONOSCOPY WITH PROPOFOL;  Surgeon: Jonathon Bellows, MD;  Location: [facility];  Service: Endoscopy;  Laterality: N/A;   ESOPHAGOGASTRODUODENOSCOPY (EGD) WITH PROPOFOL N/A 03/21/2016   Procedure: ESOPHAGOGASTRODUODENOSCOPY (EGD) WITH PROPOFOL;  Surgeon: Jonathon Bellows, MD;  Location: [facility];  Service: Endoscopy;  Laterality: N/A;   GASTRIC BYPASS  2009   LAPAROSCOPY N/A 01/05/2014   Procedure: LAPAROSCOPY DIAGNOSTIC CONVERTED TO  LAPAROTOMY ILECTOMY;  Surgeon: Erroll Luna, MD;  Location: [facility];  Service: General;  Laterality: N/A;   PARTIAL COLECTOMY     WISDOM TOOTH EXTRACTION      Family History  Problem Relation Age of Onset   Heart disease Mother    CAD Mother    Kidney disease Mother    Heart attack Father    Obesity Sister    Heart attack Brother        after surgery   Social History:  reports that she has never smoked. She has never used smokeless tobacco. She reports current alcohol use. She reports that she does not use drugs.  Allergies: No Known Allergies  Facility-Administered  Medications Prior to Admission  Medication Dose Route Frequency Provider Last Rate Last Admin   cyanocobalamin ((VITAMIN B-12)) injection 1,000 mcg  1,000 mcg Subcutaneous Q30 days Steele Sizer, MD   1,000 mcg at 11/16/17 7124   Medications Prior to Admission  Medication Sig Dispense Refill   acetaminophen (TYLENOL) 325 MG tablet Take 2 tablets (650 mg total) by mouth every 6 (six) hours as needed for mild pain (or Temp > 100).     ALPRAZolam (XANAX) 0.5 MG tablet Take 0.25 mg by mouth at bedtime.     Artificial Tear Ointment (DRY EYES OP) Place 1 drop into both eyes daily as needed (dry eyes).     atenolol (TENORMIN) 25 MG tablet Take 1 tablet (25 mg total) by mouth daily. (Patient taking differently: Take 25 mg by mouth every evening.) 90 tablet 1   buPROPion (WELLBUTRIN XL) 150 MG 24 hr tablet TAKE 1 TABLET EVERY DAY (Patient taking differently: Take 150 mg by mouth in the morning.) 90 tablet 0   citalopram (CELEXA) 40 MG tablet Take 40 mg by mouth in the morning.     cyanocobalamin (,VITAMIN B-12,) 1000 MCG/ML injection Inject 1,000 mcg into the muscle every 30 (thirty) days.     hydrALAZINE (APRESOLINE) 50 MG tablet Take 50 mg by mouth 2 (two) times daily.  lisinopril (ZESTRIL) 30 MG tablet Take 30 mg by mouth in the morning.      No results found for this or any previous visit (from the past 48 hour(s)). No results found.  Review of Systems  Constitutional:  Negative for chills and fever.  HENT:  Negative for hearing loss.   Respiratory:  Negative for cough.   Cardiovascular:  Negative for chest pain and palpitations.  Gastrointestinal:  Negative for abdominal pain, nausea and vomiting.  Genitourinary:  Negative for dysuria and urgency.  Musculoskeletal:  Negative for myalgias and neck pain.  Skin:  Negative for rash.  Neurological:  Negative for dizziness and headaches.  Hematological:  Does not bruise/bleed easily.  Psychiatric/Behavioral:  Negative for suicidal ideas.     Blood pressure (!) 172/79, pulse 62, temperature 98.1 F (36.7 C), temperature source Oral, resp. rate 18, height 5\' 4"  (1.626 m), weight 79.4 kg, SpO2 99 %. Physical Exam Vitals reviewed.  Constitutional:      Appearance: She is well-developed.  HENT:     Head: Normocephalic and atraumatic.  Eyes:     Conjunctiva/sclera: Conjunctivae normal.     Pupils: Pupils are equal, round, and reactive to light.  Cardiovascular:     Rate and Rhythm: Normal rate and regular rhythm.  Pulmonary:     Effort: Pulmonary effort is normal.     Breath sounds: Normal breath sounds.  Abdominal:     General: Bowel sounds are normal. There is no distension.     Palpations: Abdomen is soft.     Tenderness: There is no abdominal tenderness.  Musculoskeletal:        General: Normal range of motion.     Cervical back: Normal range of motion and neck supple.  Skin:    General: Skin is warm and dry.  Neurological:     Mental Status: She is alert and oriented to person, place, and time.  Psychiatric:        Behavior: Behavior normal.     Assessment/Plan 70 yo female with abdominal pain and recent pancreatitis -lap chole w ioc -ERAS protocol -planned outpatient procedure  Mickeal Skinner, MD 11/10/2020, 12:36 PM

## 2020-11-10 NOTE — Anesthesia Preprocedure Evaluation (Addendum)
Anesthesia Evaluation  Patient identified by MRN, date of birth, ID band Patient awake    Reviewed: Allergy & Precautions, NPO status , Patient's Chart, lab work & pertinent test results, reviewed documented beta blocker date and time   History of Anesthesia Complications Negative for: history of anesthetic complications  Airway Mallampati: II  TM Distance: >3 FB Neck ROM: Full    Dental  (+) Missing,    Pulmonary  COVID+ 10/15/20   Pulmonary exam normal        Cardiovascular hypertension, Pt. on medications and Pt. on home beta blockers + Peripheral Vascular Disease  Normal cardiovascular exam     Neuro/Psych Anxiety Depression negative neurological ROS     GI/Hepatic Neg liver ROS, gallstones   Endo/Other  negative endocrine ROS  Renal/GU negative Renal ROS  negative genitourinary   Musculoskeletal  (+) Arthritis ,   Abdominal   Peds  Hematology  (+) anemia ,   Anesthesia Other Findings Day of surgery medications reviewed with patient.  Reproductive/Obstetrics negative OB ROS                            Anesthesia Physical Anesthesia Plan  ASA: 2  Anesthesia Plan: General   Post-op Pain Management:    Induction: Intravenous  PONV Risk Score and Plan: 4 or greater and Treatment may vary due to age or medical condition, Ondansetron and Dexamethasone  Airway Management Planned: Oral ETT  Additional Equipment: None  Intra-op Plan:   Post-operative Plan: Extubation in OR  Informed Consent: I have reviewed the patients History and Physical, chart, labs and discussed the procedure including the risks, benefits and alternatives for the proposed anesthesia with the patient or authorized representative who has indicated his/her understanding and acceptance.     Dental advisory given  Plan Discussed with: CRNA  Anesthesia Plan Comments:        Anesthesia Quick  Evaluation

## 2020-11-10 NOTE — Discharge Instructions (Addendum)
[facility] ______[facility] LAPAROSCOPIC SURGERY: POST OP INSTRUCTIONS Always review your discharge instruction sheet given to you by the facility where your surgery was performed. IF YOU HAVE DISABILITY OR FAMILY LEAVE FORMS, YOU MUST BRING THEM TO THE OFFICE FOR PROCESSING.   DO NOT GIVE THEM TO YOUR DOCTOR.  A prescription for pain medication may be given to you upon discharge.  Take your pain medication as prescribed, if needed.  If narcotic pain medicine is not needed, then you may take acetaminophen (Tylenol) or ibuprofen (Advil) as needed. Take your usually prescribed medications unless otherwise directed. If you need a refill on your pain medication, please contact your pharmacy.  They will contact our office to request authorization. Prescriptions will not be filled after 5pm or on week-ends. You should follow a light diet the first few days after arrival home, such as soup and crackers, etc.  Be sure to include lots of fluids daily. Most patients will experience some swelling and bruising in the area of the incisions.  Ice packs will help.  Swelling and bruising can take several days to resolve.  It is common to experience some constipation if taking pain medication after surgery.  Increasing fluid intake and taking a stool softener (such as Colace) will usually help or prevent this problem from occurring.  A mild laxative (Milk of Magnesia or Miralax) should be taken according to package instructions if there are no bowel movements after 48 hours. Unless discharge instructions indicate otherwise, you may remove your bandages 24-48 hours after surgery, and you may shower at that time.  You may have steri-strips (small skin tapes) in place directly over the incision.  These strips should be left on the skin for 7-10 days.  If your surgeon used skin glue on the incision, you may shower in 24 hours.  The glue will flake off over the next 2-3 weeks.  Any sutures or staples will be  removed at the office during your follow-up visit. ACTIVITIES:  You may resume regular (light) daily activities beginning the next day--such as daily self-care, walking, climbing stairs--gradually increasing activities as tolerated.  You may have sexual intercourse when it is comfortable.  Refrain from any heavy lifting or straining until approved by your doctor. You may drive when you are no longer taking prescription pain medication, you can comfortably wear a seatbelt, and you can safely maneuver your car and apply brakes. RETURN TO WORK:  __________________________________________________________ You should see your doctor in the office for a follow-up appointment approximately 2-3 weeks after your surgery.  Make sure that you call for this appointment within a day or two after you arrive home to insure a convenient appointment time. OTHER INSTRUCTIONS: __________________________________________________________________________________________________________________________ __________________________________________________________________________________________________________________________ WHEN TO CALL YOUR DOCTOR: Fever over 101.0 Inability to urinate Continued bleeding from incision. Increased pain, redness, or drainage from the incision. Increasing abdominal pain  The clinic staff is available to answer your questions during regular business hours.  Please don't hesitate to call and ask to speak to one of the nurses for clinical concerns.  If you have a medical emergency, go to the nearest emergency room or call 911.  A surgeon from [facility] is always on call at the hospital. [address], [address], [place], [place]  [address] ? [address] [address], [place], [place]   [address] [phone] ? [phone] ? FAX [phone] Web site: [email or web address]  

## 2020-11-10 NOTE — Op Note (Signed)
PATIENT:  Alexandria Newman  70 y.o. female  PRE-OPERATIVE DIAGNOSIS:  gallstones  POST-OPERATIVE DIAGNOSIS:  gallstones  PROCEDURE:  Procedure(s): LAPAROSCOPIC CHOLECYSTECTOMY SINGLE SITE WITH INTRAOPERATIVE CHOLANGIOGRAM   SURGEON:  Adhrit Krenz, Arta Bruce, MD   ASSISTANT: none  ANESTHESIA:   local and general  Indications for procedure: Alexandria Newman is a 70 y.o. female with symptoms of Abdominal pain and Nausea and vomiting consistent with gallbladder disease, Confirmed by ultrasound.  Description of procedure: The patient was brought into the operative suite, placed supine. Anesthesia was administered with endotracheal tube. Patient was strapped in place and foot board was secured. All pressure points were offloaded by foam padding. The patient was prepped and draped in the usual sterile fashion.  A left subcostal incision was made and optical entry was used to enter the abdomen. 2 5 mm trocars were placed on in the right lateral space on in the right subcostal space. A 21mm trocar was placed in the subxiphoid space. Marcaine was infused to the subxiphoid space and lateral upper right abdomen in the transversus abdominis plane. Next the patient was placed in reverse trendelenberg. The gallbladder appeareddilated.   The gallbladder was retracted cephalad and lateral. The peritoneum was reflected off the infundibulum working lateral to medial. The cystic duct and cystic artery were identified and further dissection revealed a critical view, due to concern for choledocholithiasis a cholangiogram was performed with ductotomy and cook catheter passed through a separate subcostal stab incision. The CBD was visualized with drainage into the gallbladder. Initially there was an opacity which turned out to be a air bubble that was successfully removed. Additional attempts were performed to visualize the R and L hepatic ducts. However, there was increased noise in the background due  to subcutaneous emphysema. The cystic duct and cystic artery were doubly clipped and ligated.   The gallbladder was removed off the liver bed with cautery. The Gallbladder was placed in a specimen bag. The gallbladder fossa was irrigated and hemostasis was applied with cautery. The gallbladder was removed via the 38mm trocar. No dilation was required for removal, therefore no fascial closure was performed. Pneumoperitoneum was removed, all trocar were removed. All incisions were closed with 4-0 monocryl subcuticular stitch. The patient woke from anesthesia and was brought to PACU in stable condition. All counts were correct  Findings: dilated gallbladder  Specimen: gallbladder  Blood loss: 20 ml  Local anesthesia: 20 ml Marcaine  Complications: none  PLAN OF CARE: Discharge to home after PACU  PATIENT DISPOSITION:  PACU - hemodynamically stable.  Images:    [facility], [place]

## 2020-11-11 ENCOUNTER — Encounter (HOSPITAL_COMMUNITY): Payer: Self-pay | Admitting: General Surgery

## 2020-11-11 LAB — SURGICAL PATHOLOGY

## 2020-11-23 DIAGNOSIS — E538 Deficiency of other specified B group vitamins: Secondary | ICD-10-CM | POA: Diagnosis not present

## 2020-12-29 DIAGNOSIS — D649 Anemia, unspecified: Secondary | ICD-10-CM | POA: Diagnosis not present

## 2020-12-29 DIAGNOSIS — E785 Hyperlipidemia, unspecified: Secondary | ICD-10-CM | POA: Diagnosis not present

## 2020-12-29 DIAGNOSIS — Z0000 Encounter for general adult medical examination without abnormal findings: Secondary | ICD-10-CM | POA: Diagnosis not present

## 2020-12-29 DIAGNOSIS — R5381 Other malaise: Secondary | ICD-10-CM | POA: Diagnosis not present

## 2020-12-29 DIAGNOSIS — I10 Essential (primary) hypertension: Secondary | ICD-10-CM | POA: Diagnosis not present

## 2020-12-29 DIAGNOSIS — F3341 Major depressive disorder, recurrent, in partial remission: Secondary | ICD-10-CM | POA: Diagnosis not present

## 2020-12-29 DIAGNOSIS — E538 Deficiency of other specified B group vitamins: Secondary | ICD-10-CM | POA: Diagnosis not present

## 2020-12-29 DIAGNOSIS — R5383 Other fatigue: Secondary | ICD-10-CM | POA: Diagnosis not present

## 2020-12-30 DIAGNOSIS — J019 Acute sinusitis, unspecified: Secondary | ICD-10-CM | POA: Diagnosis not present

## 2020-12-30 DIAGNOSIS — R5381 Other malaise: Secondary | ICD-10-CM | POA: Diagnosis not present

## 2020-12-30 DIAGNOSIS — F324 Major depressive disorder, single episode, in partial remission: Secondary | ICD-10-CM | POA: Diagnosis not present

## 2020-12-30 DIAGNOSIS — R5383 Other fatigue: Secondary | ICD-10-CM | POA: Diagnosis not present

## 2020-12-30 DIAGNOSIS — Z9089 Acquired absence of other organs: Secondary | ICD-10-CM | POA: Diagnosis not present

## 2020-12-30 DIAGNOSIS — Z8639 Personal history of other endocrine, nutritional and metabolic disease: Secondary | ICD-10-CM | POA: Diagnosis not present

## 2020-12-30 DIAGNOSIS — I10 Essential (primary) hypertension: Secondary | ICD-10-CM | POA: Diagnosis not present

## 2020-12-30 DIAGNOSIS — Z79899 Other long term (current) drug therapy: Secondary | ICD-10-CM | POA: Diagnosis not present

## 2021-01-21 DIAGNOSIS — J069 Acute upper respiratory infection, unspecified: Secondary | ICD-10-CM | POA: Diagnosis not present

## 2021-01-21 DIAGNOSIS — I10 Essential (primary) hypertension: Secondary | ICD-10-CM | POA: Diagnosis not present

## 2021-01-21 DIAGNOSIS — J40 Bronchitis, not specified as acute or chronic: Secondary | ICD-10-CM | POA: Diagnosis not present

## 2021-01-28 DIAGNOSIS — H2513 Age-related nuclear cataract, bilateral: Secondary | ICD-10-CM | POA: Diagnosis not present

## 2021-01-28 DIAGNOSIS — H5213 Myopia, bilateral: Secondary | ICD-10-CM | POA: Diagnosis not present

## 2021-01-28 DIAGNOSIS — H40033 Anatomical narrow angle, bilateral: Secondary | ICD-10-CM | POA: Diagnosis not present

## 2021-02-16 ENCOUNTER — Other Ambulatory Visit: Payer: Self-pay | Admitting: Internal Medicine

## 2021-02-16 DIAGNOSIS — Z1231 Encounter for screening mammogram for malignant neoplasm of breast: Secondary | ICD-10-CM

## 2021-02-17 DIAGNOSIS — Z23 Encounter for immunization: Secondary | ICD-10-CM | POA: Diagnosis not present

## 2021-02-17 DIAGNOSIS — E538 Deficiency of other specified B group vitamins: Secondary | ICD-10-CM | POA: Diagnosis not present

## 2021-03-12 ENCOUNTER — Ambulatory Visit
Admission: RE | Admit: 2021-03-12 | Discharge: 2021-03-12 | Disposition: A | Discharge status: Home / Self Care | Payer: Medicare HMO | Source: Ambulatory Visit | Attending: Internal Medicine | Admitting: Internal Medicine

## 2021-03-12 ENCOUNTER — Other Ambulatory Visit: Payer: Self-pay

## 2021-03-12 DIAGNOSIS — Z1231 Encounter for screening mammogram for malignant neoplasm of breast: Secondary | ICD-10-CM | POA: Insufficient documentation

## 2021-04-14 DIAGNOSIS — E538 Deficiency of other specified B group vitamins: Secondary | ICD-10-CM | POA: Diagnosis not present

## 2021-05-12 ENCOUNTER — Other Ambulatory Visit: Payer: Self-pay | Admitting: Family Medicine

## 2021-05-12 DIAGNOSIS — R1012 Left upper quadrant pain: Secondary | ICD-10-CM

## 2021-05-12 DIAGNOSIS — R002 Palpitations: Secondary | ICD-10-CM | POA: Diagnosis not present

## 2021-05-12 DIAGNOSIS — R1011 Right upper quadrant pain: Secondary | ICD-10-CM | POA: Diagnosis not present

## 2021-05-13 DIAGNOSIS — R1011 Right upper quadrant pain: Secondary | ICD-10-CM | POA: Diagnosis not present

## 2021-05-13 DIAGNOSIS — R1012 Left upper quadrant pain: Secondary | ICD-10-CM | POA: Diagnosis not present

## 2021-05-17 DIAGNOSIS — E538 Deficiency of other specified B group vitamins: Secondary | ICD-10-CM | POA: Diagnosis not present

## 2021-05-19 ENCOUNTER — Ambulatory Visit
Admission: RE | Admit: 2021-05-19 | Discharge: 2021-05-19 | Disposition: A | Discharge status: Home / Self Care | Payer: Medicare HMO | Source: Ambulatory Visit | Attending: Family Medicine | Admitting: Family Medicine

## 2021-05-19 ENCOUNTER — Other Ambulatory Visit: Payer: Self-pay

## 2021-05-19 DIAGNOSIS — R1011 Right upper quadrant pain: Secondary | ICD-10-CM | POA: Diagnosis not present

## 2021-05-19 DIAGNOSIS — R109 Unspecified abdominal pain: Secondary | ICD-10-CM | POA: Diagnosis not present

## 2021-05-19 DIAGNOSIS — R1012 Left upper quadrant pain: Secondary | ICD-10-CM | POA: Diagnosis not present

## 2021-05-19 DIAGNOSIS — K760 Fatty (change of) liver, not elsewhere classified: Secondary | ICD-10-CM | POA: Diagnosis not present

## 2021-06-02 DIAGNOSIS — R002 Palpitations: Secondary | ICD-10-CM | POA: Diagnosis not present

## 2021-06-02 DIAGNOSIS — I10 Essential (primary) hypertension: Secondary | ICD-10-CM | POA: Diagnosis not present

## 2021-06-02 DIAGNOSIS — E782 Mixed hyperlipidemia: Secondary | ICD-10-CM | POA: Diagnosis not present

## 2021-06-02 DIAGNOSIS — Z8249 Family history of ischemic heart disease and other diseases of the circulatory system: Secondary | ICD-10-CM | POA: Diagnosis not present

## 2021-06-02 DIAGNOSIS — R079 Chest pain, unspecified: Secondary | ICD-10-CM | POA: Diagnosis not present

## 2021-06-17 DIAGNOSIS — E538 Deficiency of other specified B group vitamins: Secondary | ICD-10-CM | POA: Diagnosis not present

## 2021-07-12 DIAGNOSIS — R5381 Other malaise: Secondary | ICD-10-CM | POA: Diagnosis not present

## 2021-07-12 DIAGNOSIS — I10 Essential (primary) hypertension: Secondary | ICD-10-CM | POA: Diagnosis not present

## 2021-07-12 DIAGNOSIS — E785 Hyperlipidemia, unspecified: Secondary | ICD-10-CM | POA: Diagnosis not present

## 2021-07-12 DIAGNOSIS — E538 Deficiency of other specified B group vitamins: Secondary | ICD-10-CM | POA: Diagnosis not present

## 2021-07-12 DIAGNOSIS — R5383 Other fatigue: Secondary | ICD-10-CM | POA: Diagnosis not present

## 2021-07-12 DIAGNOSIS — J019 Acute sinusitis, unspecified: Secondary | ICD-10-CM | POA: Diagnosis not present

## 2021-07-12 DIAGNOSIS — Z9049 Acquired absence of other specified parts of digestive tract: Secondary | ICD-10-CM | POA: Diagnosis not present

## 2021-07-19 DIAGNOSIS — D649 Anemia, unspecified: Secondary | ICD-10-CM | POA: Diagnosis not present

## 2021-07-19 DIAGNOSIS — Z1389 Encounter for screening for other disorder: Secondary | ICD-10-CM | POA: Diagnosis not present

## 2021-07-19 DIAGNOSIS — I10 Essential (primary) hypertension: Secondary | ICD-10-CM | POA: Diagnosis not present

## 2021-07-19 DIAGNOSIS — F324 Major depressive disorder, single episode, in partial remission: Secondary | ICD-10-CM | POA: Diagnosis not present

## 2021-07-19 DIAGNOSIS — Z0000 Encounter for general adult medical examination without abnormal findings: Secondary | ICD-10-CM | POA: Diagnosis not present

## 2021-07-19 DIAGNOSIS — J209 Acute bronchitis, unspecified: Secondary | ICD-10-CM | POA: Diagnosis not present

## 2021-08-09 DIAGNOSIS — I2693 Single subsegmental pulmonary embolism without acute cor pulmonale: Secondary | ICD-10-CM | POA: Diagnosis not present

## 2021-08-09 DIAGNOSIS — I2510 Atherosclerotic heart disease of native coronary artery without angina pectoris: Secondary | ICD-10-CM | POA: Diagnosis not present

## 2021-08-10 DIAGNOSIS — F324 Major depressive disorder, single episode, in partial remission: Secondary | ICD-10-CM | POA: Diagnosis not present

## 2021-08-10 DIAGNOSIS — I2699 Other pulmonary embolism without acute cor pulmonale: Secondary | ICD-10-CM | POA: Diagnosis not present

## 2021-08-10 DIAGNOSIS — D649 Anemia, unspecified: Secondary | ICD-10-CM | POA: Diagnosis not present

## 2021-08-10 DIAGNOSIS — R5383 Other fatigue: Secondary | ICD-10-CM | POA: Diagnosis not present

## 2021-08-10 DIAGNOSIS — E785 Hyperlipidemia, unspecified: Secondary | ICD-10-CM | POA: Diagnosis not present

## 2021-08-10 DIAGNOSIS — I10 Essential (primary) hypertension: Secondary | ICD-10-CM | POA: Diagnosis not present

## 2021-08-13 DIAGNOSIS — I2694 Multiple subsegmental pulmonary emboli without acute cor pulmonale: Secondary | ICD-10-CM | POA: Diagnosis not present

## 2021-08-13 DIAGNOSIS — I10 Essential (primary) hypertension: Secondary | ICD-10-CM | POA: Diagnosis not present

## 2021-08-16 ENCOUNTER — Other Ambulatory Visit: Payer: Self-pay | Admitting: Cardiology

## 2021-08-16 DIAGNOSIS — I2694 Multiple subsegmental pulmonary emboli without acute cor pulmonale: Secondary | ICD-10-CM

## 2021-08-17 ENCOUNTER — Ambulatory Visit
Admission: RE | Admit: 2021-08-17 | Discharge: 2021-08-17 | Disposition: A | Discharge status: Home / Self Care | Payer: Medicare HMO | Source: Ambulatory Visit | Attending: Cardiology | Admitting: Cardiology

## 2021-08-17 DIAGNOSIS — I2694 Multiple subsegmental pulmonary emboli without acute cor pulmonale: Secondary | ICD-10-CM | POA: Diagnosis not present

## 2021-08-17 DIAGNOSIS — I2699 Other pulmonary embolism without acute cor pulmonale: Secondary | ICD-10-CM | POA: Diagnosis not present

## 2021-08-24 ENCOUNTER — Ambulatory Visit: Payer: Medicare HMO

## 2021-09-01 DIAGNOSIS — E538 Deficiency of other specified B group vitamins: Secondary | ICD-10-CM | POA: Diagnosis not present

## 2021-11-12 DIAGNOSIS — I2694 Multiple subsegmental pulmonary emboli without acute cor pulmonale: Secondary | ICD-10-CM | POA: Diagnosis not present

## 2021-11-12 DIAGNOSIS — I10 Essential (primary) hypertension: Secondary | ICD-10-CM | POA: Diagnosis not present

## 2021-11-12 DIAGNOSIS — E782 Mixed hyperlipidemia: Secondary | ICD-10-CM | POA: Diagnosis not present

## 2021-11-13 DIAGNOSIS — H5213 Myopia, bilateral: Secondary | ICD-10-CM | POA: Diagnosis not present

## 2021-12-01 DIAGNOSIS — Z01818 Encounter for other preprocedural examination: Secondary | ICD-10-CM | POA: Diagnosis not present

## 2021-12-01 DIAGNOSIS — H25813 Combined forms of age-related cataract, bilateral: Secondary | ICD-10-CM | POA: Diagnosis not present

## 2021-12-01 DIAGNOSIS — H25812 Combined forms of age-related cataract, left eye: Secondary | ICD-10-CM | POA: Diagnosis not present

## 2021-12-02 DIAGNOSIS — D649 Anemia, unspecified: Secondary | ICD-10-CM | POA: Diagnosis not present

## 2021-12-02 DIAGNOSIS — Z86711 Personal history of pulmonary embolism: Secondary | ICD-10-CM | POA: Diagnosis not present

## 2021-12-02 DIAGNOSIS — F324 Major depressive disorder, single episode, in partial remission: Secondary | ICD-10-CM | POA: Diagnosis not present

## 2021-12-02 DIAGNOSIS — I10 Essential (primary) hypertension: Secondary | ICD-10-CM | POA: Diagnosis not present

## 2021-12-02 DIAGNOSIS — R5383 Other fatigue: Secondary | ICD-10-CM | POA: Diagnosis not present

## 2021-12-13 DIAGNOSIS — H269 Unspecified cataract: Secondary | ICD-10-CM | POA: Diagnosis not present

## 2021-12-13 DIAGNOSIS — H25812 Combined forms of age-related cataract, left eye: Secondary | ICD-10-CM | POA: Diagnosis not present

## 2021-12-28 DIAGNOSIS — Z0000 Encounter for general adult medical examination without abnormal findings: Secondary | ICD-10-CM | POA: Diagnosis not present

## 2021-12-28 DIAGNOSIS — D649 Anemia, unspecified: Secondary | ICD-10-CM | POA: Diagnosis not present

## 2021-12-28 DIAGNOSIS — J209 Acute bronchitis, unspecified: Secondary | ICD-10-CM | POA: Diagnosis not present

## 2021-12-28 DIAGNOSIS — I10 Essential (primary) hypertension: Secondary | ICD-10-CM | POA: Diagnosis not present

## 2021-12-28 DIAGNOSIS — R7309 Other abnormal glucose: Secondary | ICD-10-CM | POA: Diagnosis not present

## 2021-12-28 DIAGNOSIS — R5383 Other fatigue: Secondary | ICD-10-CM | POA: Diagnosis not present

## 2021-12-28 DIAGNOSIS — F3341 Major depressive disorder, recurrent, in partial remission: Secondary | ICD-10-CM | POA: Diagnosis not present

## 2021-12-28 DIAGNOSIS — R5381 Other malaise: Secondary | ICD-10-CM | POA: Diagnosis not present

## 2021-12-31 DIAGNOSIS — H25811 Combined forms of age-related cataract, right eye: Secondary | ICD-10-CM | POA: Diagnosis not present

## 2021-12-31 DIAGNOSIS — H2511 Age-related nuclear cataract, right eye: Secondary | ICD-10-CM | POA: Diagnosis not present

## 2021-12-31 DIAGNOSIS — H269 Unspecified cataract: Secondary | ICD-10-CM | POA: Diagnosis not present

## 2022-01-04 DIAGNOSIS — D649 Anemia, unspecified: Secondary | ICD-10-CM | POA: Diagnosis not present

## 2022-01-04 DIAGNOSIS — F3341 Major depressive disorder, recurrent, in partial remission: Secondary | ICD-10-CM | POA: Diagnosis not present

## 2022-01-04 DIAGNOSIS — D508 Other iron deficiency anemias: Secondary | ICD-10-CM | POA: Diagnosis not present

## 2022-01-04 DIAGNOSIS — I10 Essential (primary) hypertension: Secondary | ICD-10-CM | POA: Diagnosis not present

## 2022-01-04 DIAGNOSIS — S39012A Strain of muscle, fascia and tendon of lower back, initial encounter: Secondary | ICD-10-CM | POA: Diagnosis not present

## 2022-01-07 DIAGNOSIS — D508 Other iron deficiency anemias: Secondary | ICD-10-CM | POA: Diagnosis not present

## 2022-01-07 DIAGNOSIS — D649 Anemia, unspecified: Secondary | ICD-10-CM | POA: Diagnosis not present

## 2022-01-07 DIAGNOSIS — I10 Essential (primary) hypertension: Secondary | ICD-10-CM | POA: Diagnosis not present

## 2022-01-07 DIAGNOSIS — S39012A Strain of muscle, fascia and tendon of lower back, initial encounter: Secondary | ICD-10-CM | POA: Diagnosis not present

## 2022-01-07 DIAGNOSIS — F3341 Major depressive disorder, recurrent, in partial remission: Secondary | ICD-10-CM | POA: Diagnosis not present

## 2022-02-16 DIAGNOSIS — R791 Abnormal coagulation profile: Secondary | ICD-10-CM | POA: Diagnosis not present

## 2022-02-23 DIAGNOSIS — R791 Abnormal coagulation profile: Secondary | ICD-10-CM | POA: Diagnosis not present

## 2022-03-09 DIAGNOSIS — Z7901 Long term (current) use of anticoagulants: Secondary | ICD-10-CM | POA: Diagnosis not present

## 2022-03-09 DIAGNOSIS — Z86711 Personal history of pulmonary embolism: Secondary | ICD-10-CM | POA: Diagnosis not present

## 2022-03-09 DIAGNOSIS — R791 Abnormal coagulation profile: Secondary | ICD-10-CM | POA: Diagnosis not present

## 2022-03-15 DIAGNOSIS — R791 Abnormal coagulation profile: Secondary | ICD-10-CM | POA: Diagnosis not present

## 2022-03-28 DIAGNOSIS — R791 Abnormal coagulation profile: Secondary | ICD-10-CM | POA: Diagnosis not present

## 2022-03-29 DIAGNOSIS — R7309 Other abnormal glucose: Secondary | ICD-10-CM | POA: Diagnosis not present

## 2022-03-29 DIAGNOSIS — F3341 Major depressive disorder, recurrent, in partial remission: Secondary | ICD-10-CM | POA: Diagnosis not present

## 2022-03-29 DIAGNOSIS — J209 Acute bronchitis, unspecified: Secondary | ICD-10-CM | POA: Diagnosis not present

## 2022-03-29 DIAGNOSIS — D649 Anemia, unspecified: Secondary | ICD-10-CM | POA: Diagnosis not present

## 2022-03-29 DIAGNOSIS — I10 Essential (primary) hypertension: Secondary | ICD-10-CM | POA: Diagnosis not present

## 2022-03-29 DIAGNOSIS — R5383 Other fatigue: Secondary | ICD-10-CM | POA: Diagnosis not present

## 2022-03-29 DIAGNOSIS — R5381 Other malaise: Secondary | ICD-10-CM | POA: Diagnosis not present

## 2022-04-20 DIAGNOSIS — R791 Abnormal coagulation profile: Secondary | ICD-10-CM | POA: Diagnosis not present

## 2022-04-26 DIAGNOSIS — E538 Deficiency of other specified B group vitamins: Secondary | ICD-10-CM | POA: Diagnosis not present

## 2022-04-26 DIAGNOSIS — J069 Acute upper respiratory infection, unspecified: Secondary | ICD-10-CM | POA: Diagnosis not present

## 2022-05-05 ENCOUNTER — Other Ambulatory Visit: Payer: Self-pay | Admitting: Physician Assistant

## 2022-05-05 ENCOUNTER — Ambulatory Visit
Admission: RE | Admit: 2022-05-05 | Discharge: 2022-05-05 | Disposition: A | Discharge status: Home / Self Care | Payer: Medicare HMO | Source: Ambulatory Visit | Attending: Physician Assistant | Admitting: Physician Assistant

## 2022-05-05 DIAGNOSIS — R0789 Other chest pain: Secondary | ICD-10-CM | POA: Diagnosis not present

## 2022-05-05 DIAGNOSIS — I2694 Multiple subsegmental pulmonary emboli without acute cor pulmonale: Secondary | ICD-10-CM | POA: Diagnosis not present

## 2022-05-05 DIAGNOSIS — Z23 Encounter for immunization: Secondary | ICD-10-CM | POA: Diagnosis not present

## 2022-05-05 DIAGNOSIS — I10 Essential (primary) hypertension: Secondary | ICD-10-CM | POA: Diagnosis not present

## 2022-05-05 LAB — POCT I-STAT CREATININE: Creatinine, Ser: 0.9 mg/dL (ref 0.44–1.00)

## 2022-05-05 MED ORDER — IOHEXOL 350 MG/ML SOLN
75.0000 mL | Freq: Once | INTRAVENOUS | Status: AC | PRN
  Administered 2022-05-05: 75 mL via INTRAVENOUS

## 2022-05-12 DIAGNOSIS — R791 Abnormal coagulation profile: Secondary | ICD-10-CM | POA: Diagnosis not present

## 2022-05-12 DIAGNOSIS — D508 Other iron deficiency anemias: Secondary | ICD-10-CM | POA: Diagnosis not present

## 2022-05-12 DIAGNOSIS — I10 Essential (primary) hypertension: Secondary | ICD-10-CM | POA: Diagnosis not present

## 2022-05-12 DIAGNOSIS — I2694 Multiple subsegmental pulmonary emboli without acute cor pulmonale: Secondary | ICD-10-CM | POA: Diagnosis not present

## 2022-05-12 DIAGNOSIS — F3341 Major depressive disorder, recurrent, in partial remission: Secondary | ICD-10-CM | POA: Diagnosis not present

## 2022-05-12 DIAGNOSIS — R7309 Other abnormal glucose: Secondary | ICD-10-CM | POA: Diagnosis not present

## 2022-05-19 DIAGNOSIS — Z2821 Immunization not carried out because of patient refusal: Secondary | ICD-10-CM | POA: Diagnosis not present

## 2022-05-19 DIAGNOSIS — F329 Major depressive disorder, single episode, unspecified: Secondary | ICD-10-CM | POA: Diagnosis not present

## 2022-05-19 DIAGNOSIS — D649 Anemia, unspecified: Secondary | ICD-10-CM | POA: Diagnosis not present

## 2022-05-19 DIAGNOSIS — Z0000 Encounter for general adult medical examination without abnormal findings: Secondary | ICD-10-CM | POA: Diagnosis not present

## 2022-05-19 DIAGNOSIS — Z86711 Personal history of pulmonary embolism: Secondary | ICD-10-CM | POA: Diagnosis not present

## 2022-05-19 DIAGNOSIS — I10 Essential (primary) hypertension: Secondary | ICD-10-CM | POA: Diagnosis not present

## 2022-05-24 ENCOUNTER — Other Ambulatory Visit: Payer: Self-pay | Admitting: Internal Medicine

## 2022-05-24 DIAGNOSIS — Z1231 Encounter for screening mammogram for malignant neoplasm of breast: Secondary | ICD-10-CM

## 2022-06-03 DIAGNOSIS — E538 Deficiency of other specified B group vitamins: Secondary | ICD-10-CM | POA: Diagnosis not present

## 2022-06-20 DIAGNOSIS — Z7901 Long term (current) use of anticoagulants: Secondary | ICD-10-CM | POA: Diagnosis not present

## 2022-06-20 DIAGNOSIS — R791 Abnormal coagulation profile: Secondary | ICD-10-CM | POA: Diagnosis not present

## 2022-06-30 DIAGNOSIS — M47812 Spondylosis without myelopathy or radiculopathy, cervical region: Secondary | ICD-10-CM | POA: Diagnosis not present

## 2022-06-30 DIAGNOSIS — R519 Headache, unspecified: Secondary | ICD-10-CM | POA: Diagnosis not present

## 2022-06-30 DIAGNOSIS — M542 Cervicalgia: Secondary | ICD-10-CM | POA: Diagnosis not present

## 2022-06-30 DIAGNOSIS — M7582 Other shoulder lesions, left shoulder: Secondary | ICD-10-CM | POA: Diagnosis not present

## 2022-06-30 DIAGNOSIS — I10 Essential (primary) hypertension: Secondary | ICD-10-CM | POA: Diagnosis not present

## 2022-06-30 DIAGNOSIS — M791 Myalgia, unspecified site: Secondary | ICD-10-CM | POA: Diagnosis not present

## 2022-07-18 DIAGNOSIS — E538 Deficiency of other specified B group vitamins: Secondary | ICD-10-CM | POA: Diagnosis not present

## 2022-07-18 DIAGNOSIS — R791 Abnormal coagulation profile: Secondary | ICD-10-CM | POA: Diagnosis not present

## 2022-07-18 DIAGNOSIS — Z7901 Long term (current) use of anticoagulants: Secondary | ICD-10-CM | POA: Diagnosis not present

## 2022-08-15 DIAGNOSIS — R791 Abnormal coagulation profile: Secondary | ICD-10-CM | POA: Diagnosis not present

## 2022-08-15 DIAGNOSIS — Z7901 Long term (current) use of anticoagulants: Secondary | ICD-10-CM | POA: Diagnosis not present

## 2022-08-23 DIAGNOSIS — Z08 Encounter for follow-up examination after completed treatment for malignant neoplasm: Secondary | ICD-10-CM | POA: Diagnosis not present

## 2022-08-23 DIAGNOSIS — L814 Other melanin hyperpigmentation: Secondary | ICD-10-CM | POA: Diagnosis not present

## 2022-08-23 DIAGNOSIS — Z7189 Other specified counseling: Secondary | ICD-10-CM | POA: Diagnosis not present

## 2022-08-23 DIAGNOSIS — D225 Melanocytic nevi of trunk: Secondary | ICD-10-CM | POA: Diagnosis not present

## 2022-08-23 DIAGNOSIS — L821 Other seborrheic keratosis: Secondary | ICD-10-CM | POA: Diagnosis not present

## 2022-08-23 DIAGNOSIS — L298 Other pruritus: Secondary | ICD-10-CM | POA: Diagnosis not present

## 2022-08-23 DIAGNOSIS — Z85828 Personal history of other malignant neoplasm of skin: Secondary | ICD-10-CM | POA: Diagnosis not present

## 2022-08-24 DIAGNOSIS — R197 Diarrhea, unspecified: Secondary | ICD-10-CM | POA: Diagnosis not present

## 2022-08-24 DIAGNOSIS — K219 Gastro-esophageal reflux disease without esophagitis: Secondary | ICD-10-CM | POA: Diagnosis not present

## 2022-08-24 DIAGNOSIS — E538 Deficiency of other specified B group vitamins: Secondary | ICD-10-CM | POA: Diagnosis not present

## 2022-08-24 DIAGNOSIS — R791 Abnormal coagulation profile: Secondary | ICD-10-CM | POA: Diagnosis not present

## 2022-09-01 ENCOUNTER — Ambulatory Visit
Admission: RE | Admit: 2022-09-01 | Discharge: 2022-09-01 | Disposition: A | Discharge status: Home / Self Care | Payer: Medicare HMO | Source: Ambulatory Visit | Attending: Internal Medicine | Admitting: Internal Medicine

## 2022-09-01 DIAGNOSIS — Z1231 Encounter for screening mammogram for malignant neoplasm of breast: Secondary | ICD-10-CM | POA: Insufficient documentation

## 2022-09-16 DIAGNOSIS — R8290 Unspecified abnormal findings in urine: Secondary | ICD-10-CM | POA: Diagnosis not present

## 2022-09-16 DIAGNOSIS — D649 Anemia, unspecified: Secondary | ICD-10-CM | POA: Diagnosis not present

## 2022-09-16 DIAGNOSIS — Z2821 Immunization not carried out because of patient refusal: Secondary | ICD-10-CM | POA: Diagnosis not present

## 2022-09-16 DIAGNOSIS — R791 Abnormal coagulation profile: Secondary | ICD-10-CM | POA: Diagnosis not present

## 2022-09-16 DIAGNOSIS — Z86711 Personal history of pulmonary embolism: Secondary | ICD-10-CM | POA: Diagnosis not present

## 2022-09-16 DIAGNOSIS — Z7901 Long term (current) use of anticoagulants: Secondary | ICD-10-CM | POA: Diagnosis not present

## 2022-09-16 DIAGNOSIS — I10 Essential (primary) hypertension: Secondary | ICD-10-CM | POA: Diagnosis not present

## 2022-09-16 DIAGNOSIS — R7309 Other abnormal glucose: Secondary | ICD-10-CM | POA: Diagnosis not present

## 2022-09-23 DIAGNOSIS — Z9884 Bariatric surgery status: Secondary | ICD-10-CM | POA: Diagnosis not present

## 2022-09-23 DIAGNOSIS — E876 Hypokalemia: Secondary | ICD-10-CM | POA: Diagnosis not present

## 2022-09-23 DIAGNOSIS — I10 Essential (primary) hypertension: Secondary | ICD-10-CM | POA: Diagnosis not present

## 2022-09-23 DIAGNOSIS — Z86711 Personal history of pulmonary embolism: Secondary | ICD-10-CM | POA: Diagnosis not present

## 2022-09-23 DIAGNOSIS — F324 Major depressive disorder, single episode, in partial remission: Secondary | ICD-10-CM | POA: Diagnosis not present

## 2022-09-23 DIAGNOSIS — D649 Anemia, unspecified: Secondary | ICD-10-CM | POA: Diagnosis not present

## 2022-09-23 DIAGNOSIS — R001 Bradycardia, unspecified: Secondary | ICD-10-CM | POA: Diagnosis not present

## 2022-09-26 DIAGNOSIS — E538 Deficiency of other specified B group vitamins: Secondary | ICD-10-CM | POA: Diagnosis not present

## 2022-10-24 DIAGNOSIS — E876 Hypokalemia: Secondary | ICD-10-CM | POA: Diagnosis not present

## 2022-10-28 DIAGNOSIS — E538 Deficiency of other specified B group vitamins: Secondary | ICD-10-CM | POA: Diagnosis not present

## 2022-11-03 DIAGNOSIS — H524 Presbyopia: Secondary | ICD-10-CM | POA: Diagnosis not present

## 2022-11-03 DIAGNOSIS — H5203 Hypermetropia, bilateral: Secondary | ICD-10-CM | POA: Diagnosis not present

## 2022-11-23 NOTE — H&P (Signed)
Pre-Procedure H&P   Patient ID: Alexandria Newman is a 72 y.o. female.  Gastroenterology Provider: Jaynie Collins, DO  Referring Provider: Tawni Pummel, PA PCP: Barbette Reichmann, MD  Date: 11/24/2022  HPI Ms. Alexandria Newman is a 72 y.o. female who presents today for Esophagogastroduodenoscopy and Colonoscopy for GERD, diarrhea .  Patient has had ongoing diarrhea for some time since her small bowel resection after small bowel obstruction in 2015.  She reports having a bowel movements per day without melena or hematochezia.  She had a cholecystectomy in 2022 and did not notice much difference at that time.  She does report occasional nocturnal awakenings, but this is with late night eating.  Imodium does help some with the issues.  She has noted increasing belching and reflux occurring. This has improved with ppi use. She denies any dysphagia odynophagia nausea or vomiting.  No longer taking Coumadin (PE 07/2021)  No family history of colon cancer or colon polyps  She is status post appendectomy cholecystectomy hysterectomy, open gastric bypass and small bowel resection  Last colonoscopy 2017 normal.  Last EGD in 2017 demonstrated normal jejunal mucosa with biopsies normal.  Normal gastrojejunal anastomosis  Creatinine 0.8 ferritin 41 hemoglobin 11.8 MCV 92.6 platelets 238,000   Past Medical History:  Diagnosis Date   Anemia    Anxiety    B12 deficiency    Chronic insomnia    COVID-19    10-15-20   Degeneration of cervical intervertebral disc    Depression    Dumping syndrome 2016   per pt had "part of intestines removed"   Dysfunction of both eustachian tubes    History of kidney stones    Hypercholesteremia    Hypertension    Metabolic syndrome    Obesity    Osteoarthritis of right hand    Ovarian failure    TMJ (dislocation of temporomandibular joint)    Vitamin D deficiency     Past Surgical History:  Procedure Laterality Date    ABDOMINAL HYSTERECTOMY     APPENDECTOMY     COLONOSCOPY WITH PROPOFOL N/A 03/21/2016   Procedure: COLONOSCOPY WITH PROPOFOL;  Surgeon: Wyline Mood, MD;  Location: [facility];  Service: Endoscopy;  Laterality: N/A;   ESOPHAGOGASTRODUODENOSCOPY (EGD) WITH PROPOFOL N/A 03/21/2016   Procedure: ESOPHAGOGASTRODUODENOSCOPY (EGD) WITH PROPOFOL;  Surgeon: Wyline Mood, MD;  Location: [facility] END[facility]COPY;  Service: Endoscopy;  Laterality: N/A;   GASTRIC BYPASS  2009   LAPAROSCOPIC CHOLECYSTECTOMY SINGLE SITE WITH INTRAOPERATIVE CHOLANGIOGRAM N/A 11/10/2020   Procedure: LAPAROSCOPIC CHOLECYSTECTOMY SINGLE SITE WITH INTRAOPERATIVE CHOLANGIOGRAM;  Surgeon: Kinsinger, De Blanch, MD;  Location: [facility];  Service: General;  Laterality: N/A;  58   LAPAROSCOPY N/A 01/05/2014   Procedure: LAPAROSCOPY DIAGNOSTIC CONVERTED TO  LAPAROTOMY ILECTOMY;  Surgeon: Harriette Bouillon, MD;  Location: [facility] OR;  Service: General;  Laterality: N/A;   PARTIAL COLECTOMY     WISDOM TOOTH EXTRACTION      Family History No h/o GI disease or malignancy  Review of Systems  Constitutional:  Negative for activity change, appetite change, chills, diaphoresis, fatigue, fever and unexpected weight change.  HENT:  Negative for trouble swallowing and voice change.   Respiratory:  Negative for shortness of breath and wheezing.   Cardiovascular:  Negative for chest pain, palpitations and leg swelling.  Gastrointestinal:  Positive for diarrhea. Negative for abdominal distention, abdominal pain, anal bleeding, blood in stool, constipation, nausea, rectal pain and vomiting.       + Belching and reflux  Musculoskeletal:  Negative for arthralgias and myalgias.  Skin:  Negative for color change and pallor.  Neurological:  Negative for dizziness, syncope and weakness.  Psychiatric/Behavioral:  Negative for confusion.   All other systems reviewed and are negative.    Medications No current facility-administered medications on file prior to encounter.    Current Outpatient Medications on File Prior to Encounter  Medication Sig Dispense Refill   ALPRAZolam (XANAX) 0.5 MG tablet Take 0.25 mg by mouth at bedtime.     buPROPion (WELLBUTRIN XL) 150 MG 24 hr tablet TAKE 1 TABLET EVERY DAY 90 tablet 0   citalopram (CELEXA) 40 MG tablet Take 40 mg by mouth in the morning.     hydrALAZINE (APRESOLINE) 50 MG tablet Take 50 mg by mouth 2 (two) times daily.     lisinopril (ZESTRIL) 30 MG tablet Take 30 mg by mouth in the morning.     metoprolol tartrate (LOPRESSOR) 25 MG tablet Take 25 mg by mouth 2 (two) times daily.     acetaminophen (TYLENOL) 325 MG tablet Take 2 tablets (650 mg total) by mouth every 6 (six) hours as needed for mild pain (or Temp > 100).     Artificial Tear Ointment (DRY EYES OP) Place 1 drop into both eyes daily as needed (dry eyes).     atenolol (TENORMIN) 25 MG tablet Take 1 tablet (25 mg total) by mouth daily. 90 tablet 1   cyanocobalamin (,VITAMIN B-12,) 1000 MCG/ML injection Inject 1,000 mcg into the muscle every 30 (thirty) days.     oxyCODONE (OXY IR/ROXICODONE) 5 MG immediate release tablet Take 1 tablet (5 mg total) by mouth every 6 (six) hours as needed for severe pain. (Patient not taking: Reported on 11/24/2022) 20 tablet 0   [DISCONTINUED] atorvastatin (LIPITOR) 40 MG tablet Take 1 tablet (40 mg total) by mouth every evening. (Patient not taking: Reported on 10/15/2020) 90 tablet 1   [DISCONTINUED] temazepam (RESTORIL) 15 MG capsule Take 1 capsule (15 mg total) by mouth at bedtime as needed for sleep. (Patient not taking: No sig reported) 90 capsule 1    Pertinent medications related to GI and procedure were reviewed by me with the patient prior to the procedure   Current Facility-Administered Medications:    0.9 %  sodium chloride infusion, , Intravenous, Continuous, Jaynie Collins, DO, Last Rate: 20 mL/hr at 11/24/22 0841, New Bag at 11/24/22 0841  sodium chloride 20 mL/hr at 11/24/22 0841       No Known  Allergies Allergies were reviewed by me prior to the procedure  Objective   Body mass index is 29.01 kg/m. Vitals:   11/24/22 0832  BP: (!) 186/74  Pulse: 60  Resp: 16  Temp: (!) 96.7 F (35.9 C)  TempSrc: Temporal  SpO2: 96%  Weight: 76.7 kg  Height: 5\' 4"  (1.626 m)     Physical Exam Vitals and nursing note reviewed.  Constitutional:      General: She is not in acute distress.    Appearance: Normal appearance. She is not ill-appearing, toxic-appearing or diaphoretic.  HENT:     Head: Normocephalic and atraumatic.     Nose: Nose normal.     Mouth/Throat:     Mouth: Mucous membranes are moist.     Pharynx: Oropharynx is clear.  Eyes:     General: No scleral icterus.    Extraocular Movements: Extraocular movements intact.  Cardiovascular:     Rate and Rhythm: Normal rate and regular rhythm.     Heart sounds:  Normal heart sounds. No murmur heard.    No friction rub. No gallop.  Pulmonary:     Effort: Pulmonary effort is normal. No respiratory distress.     Breath sounds: Normal breath sounds. No wheezing, rhonchi or rales.  Abdominal:     General: Bowel sounds are normal. There is no distension.     Palpations: Abdomen is soft.     Tenderness: There is no abdominal tenderness. There is no guarding or rebound.  Musculoskeletal:     Cervical back: Neck supple.     Right lower leg: No edema.     Left lower leg: No edema.  Skin:    General: Skin is warm and dry.     Coloration: Skin is not jaundiced or pale.  Neurological:     General: No focal deficit present.     Mental Status: She is alert and oriented to person, place, and time. Mental status is at baseline.  Psychiatric:        Mood and Affect: Mood normal.        Behavior: Behavior normal.        Thought Content: Thought content normal.        Judgment: Judgment normal.      Assessment:  Ms. Alexandria Newman is a 72 y.o. female  who presents today for Esophagogastroduodenoscopy and Colonoscopy  for GERD, diarrhea .  Plan:  Esophagogastroduodenoscopy and Colonoscopy with possible intervention today  Esophagogastroduodenoscopy and Colonoscopy with possible biopsy, control of bleeding, polypectomy, and interventions as necessary has been discussed with the patient/patient representative. Informed consent was obtained from the patient/patient representative after explaining the indication, nature, and risks of the procedure including but not limited to death, bleeding, perforation, missed neoplasm/lesions, cardiorespiratory compromise, and reaction to medications. Opportunity for questions was given and appropriate answers were provided. Patient/patient representative has verbalized understanding is amenable to undergoing the procedure.   Jaynie Collins, DO  [facility]  Portions of the record may have been created with voice recognition software. Occasional wrong-word or 'sound-a-like' substitutions may have occurred due to the inherent limitations of voice recognition software.  Read the chart carefully and recognize, using context, where substitutions may have occurred.

## 2022-11-24 ENCOUNTER — Ambulatory Visit
Admission: RE | Admit: 2022-11-24 | Discharge: 2022-11-24 | Disposition: A | Discharge status: Home / Self Care | Payer: Medicare HMO | Attending: Gastroenterology | Admitting: Gastroenterology

## 2022-11-24 ENCOUNTER — Encounter
Admission: RE | Disposition: A | Discharge status: Home / Self Care | Payer: Self-pay | Source: Home / Self Care | Attending: Gastroenterology

## 2022-11-24 ENCOUNTER — Ambulatory Visit: Payer: Medicare HMO | Admitting: Anesthesiology

## 2022-11-24 ENCOUNTER — Encounter: Payer: Self-pay | Admitting: Gastroenterology

## 2022-11-24 DIAGNOSIS — Z90710 Acquired absence of both cervix and uterus: Secondary | ICD-10-CM | POA: Diagnosis not present

## 2022-11-24 DIAGNOSIS — K621 Rectal polyp: Secondary | ICD-10-CM | POA: Insufficient documentation

## 2022-11-24 DIAGNOSIS — F32A Depression, unspecified: Secondary | ICD-10-CM | POA: Insufficient documentation

## 2022-11-24 DIAGNOSIS — K529 Noninfective gastroenteritis and colitis, unspecified: Secondary | ICD-10-CM | POA: Insufficient documentation

## 2022-11-24 DIAGNOSIS — Z9884 Bariatric surgery status: Secondary | ICD-10-CM | POA: Diagnosis not present

## 2022-11-24 DIAGNOSIS — K219 Gastro-esophageal reflux disease without esophagitis: Secondary | ICD-10-CM | POA: Diagnosis not present

## 2022-11-24 DIAGNOSIS — K5710 Diverticulosis of small intestine without perforation or abscess without bleeding: Secondary | ICD-10-CM | POA: Insufficient documentation

## 2022-11-24 DIAGNOSIS — Z980 Intestinal bypass and anastomosis status: Secondary | ICD-10-CM | POA: Diagnosis not present

## 2022-11-24 DIAGNOSIS — R197 Diarrhea, unspecified: Secondary | ICD-10-CM | POA: Diagnosis not present

## 2022-11-24 DIAGNOSIS — K635 Polyp of colon: Secondary | ICD-10-CM | POA: Diagnosis not present

## 2022-11-24 DIAGNOSIS — K649 Unspecified hemorrhoids: Secondary | ICD-10-CM | POA: Diagnosis not present

## 2022-11-24 DIAGNOSIS — F419 Anxiety disorder, unspecified: Secondary | ICD-10-CM | POA: Diagnosis not present

## 2022-11-24 DIAGNOSIS — R12 Heartburn: Secondary | ICD-10-CM | POA: Diagnosis not present

## 2022-11-24 DIAGNOSIS — K2950 Unspecified chronic gastritis without bleeding: Secondary | ICD-10-CM | POA: Insufficient documentation

## 2022-11-24 DIAGNOSIS — K640 First degree hemorrhoids: Secondary | ICD-10-CM | POA: Diagnosis not present

## 2022-11-24 DIAGNOSIS — Z9049 Acquired absence of other specified parts of digestive tract: Secondary | ICD-10-CM | POA: Diagnosis not present

## 2022-11-24 DIAGNOSIS — I10 Essential (primary) hypertension: Secondary | ICD-10-CM | POA: Diagnosis not present

## 2022-11-24 DIAGNOSIS — B9681 Helicobacter pylori [H. pylori] as the cause of diseases classified elsewhere: Secondary | ICD-10-CM | POA: Insufficient documentation

## 2022-11-24 HISTORY — PX: BIOPSY: SHX5522

## 2022-11-24 HISTORY — PX: ESOPHAGOGASTRODUODENOSCOPY (EGD) WITH PROPOFOL: SHX5813

## 2022-11-24 HISTORY — PX: COLONOSCOPY WITH PROPOFOL: SHX5780

## 2022-11-24 HISTORY — PX: POLYPECTOMY: SHX5525

## 2022-11-24 SURGERY — COLONOSCOPY WITH PROPOFOL
Anesthesia: General

## 2022-11-24 MED ORDER — PROPOFOL 500 MG/50ML IV EMUL
INTRAVENOUS | Status: DC | PRN
  Administered 2022-11-24: 100 ug/kg/min via INTRAVENOUS

## 2022-11-24 MED ORDER — SODIUM CHLORIDE 0.9 % IV SOLN: INTRAVENOUS | Status: DC

## 2022-11-24 MED ORDER — PROPOFOL 10 MG/ML IV BOLUS
INTRAVENOUS | Status: DC | PRN
  Administered 2022-11-24: 20 mg via INTRAVENOUS
  Administered 2022-11-24: 50 mg via INTRAVENOUS
  Administered 2022-11-24: 80 mg via INTRAVENOUS

## 2022-11-24 MED ORDER — LIDOCAINE HCL (CARDIAC) PF 100 MG/5ML IV SOSY
PREFILLED_SYRINGE | INTRAVENOUS | Status: DC | PRN
  Administered 2022-11-24: 50 mg via INTRAVENOUS

## 2022-11-24 MED ORDER — PROPOFOL 10 MG/ML IV BOLUS
INTRAVENOUS | Status: AC
  Filled 2022-11-24: qty 40

## 2022-11-24 NOTE — Interval H&P Note (Signed)
History and Physical Interval Note: Preprocedure H&P from 11/24/22  was reviewed and there was no interval change after seeing and examining the patient.  Written consent was obtained from the patient after discussion of risks, benefits, and alternatives. Patient has consented to proceed with Esophagogastroduodenoscopy and Colonoscopy with possible intervention   11/24/2022 9:09 AM  Alexandria Newman  has presented today for surgery, with the diagnosis of 787.91 (ICD-9-CM) - R19.7 (ICD-10-CM) - Diarrhea, unspecified type 530.81 (ICD-9-CM) - K21.9 (ICD-10-CM) - Gastroesophageal reflux disease, unspecified whether esophagitis present.  The various methods of treatment have been discussed with the patient and family. After consideration of risks, benefits and other options for treatment, the patient has consented to  Procedure(s): COLONOSCOPY WITH PROPOFOL (N/A) ESOPHAGOGASTRODUODENOSCOPY (EGD) WITH PROPOFOL (N/A) as a surgical intervention.  The patient's history has been reviewed, patient examined, no change in status, stable for surgery.  I have reviewed the patient's chart and labs.  Questions were answered to the patient's satisfaction.     Jaynie Collins

## 2022-11-24 NOTE — Op Note (Signed)
[facility] Gastroenterology Patient Name: Alexandria Newman Procedure Date: 11/24/2022 9:13 AM MRN: [id number] Account #: [id number] Date of Birth: [birth year] Admit Type: Outpatient Age: 72 Room: [facility] ENDO ROOM 1 Gender: Female Note Status: Finalized Instrument Name: Upper Endoscope [id number] Procedure:             Upper GI endoscopy Indications:           Heartburn, Suspected esophageal reflux Providers:             Jaynie Collins DO, DO Referring MD:          Barbette Reichmann, MD (Referring MD) Medicines:             Monitored Anesthesia Care Complications:         No immediate complications. Estimated blood loss:                         Minimal. Procedure:             Pre-Anesthesia Assessment:                        - Prior to the procedure, a History and Physical was                         performed, and patient medications and allergies were                         reviewed. The patient is competent. The risks and                         benefits of the procedure and the sedation options and                         risks were discussed with the patient. All questions                         were answered and informed consent was obtained.                         Patient identification and proposed procedure were                         verified by the physician, the nurse, the anesthetist                         and the technician in the endoscopy suite. Mental                         Status Examination: alert and oriented. Airway                         Examination: normal oropharyngeal airway and neck                         mobility. Respiratory Examination: clear to                         auscultation. CV Examination: RRR, no murmurs, no S3  or S4. Prophylactic Antibiotics: The patient does not                         require prophylactic antibiotics. Prior                         Anticoagulants: The patient has taken  no anticoagulant                         or antiplatelet agents. ASA Grade Assessment: III - A                         patient with severe systemic disease. After reviewing                         the risks and benefits, the patient was deemed in                         satisfactory condition to undergo the procedure. The                         anesthesia plan was to use monitored anesthesia care                         (MAC). Immediately prior to administration of                         medications, the patient was re-assessed for adequacy                         to receive sedatives. The heart rate, respiratory                         rate, oxygen saturations, blood pressure, adequacy of                         pulmonary ventilation, and response to care were                         monitored throughout the procedure. The physical                         status of the patient was re-assessed after the                         procedure.                        After obtaining informed consent, the endoscope was                         passed under direct vision. Throughout the procedure,                         the patient's blood pressure, pulse, and oxygen                         saturations were monitored continuously. The Endoscope  was introduced through the mouth, and advanced to the                         anastomosis site of gastric bypass. The upper GI                         endoscopy was accomplished without difficulty. The                         patient tolerated the procedure well. Findings:      The examined jejunum was normal. Biopsies for histology were taken with       a cold forceps for evaluation of celiac disease. Estimated blood loss       was minimal.      Evidence of a gastric bypass was found in the anastomosis. This was       characterized by healthy appearing mucosa, an intact appearance and       visible sutures. Estimated blood loss:  none.      Normal mucosa was found in the gastric body. Remant gastric pouch       appears normal. Biopsies were taken with a cold forceps for Helicobacter       pylori testing. Estimated blood loss was minimal.      The Z-line was regular. Estimated blood loss: none.      Esophagogastric landmarks were identified: the Z-line was found at 37 cm       from the incisors.      The exam of the esophagus was otherwise normal.      A small non-bleeding diverticulum was found in the jejunum. Estimated       blood loss: none. Impression:            - Normal examined jejunum. Biopsied.                        - A gastric bypass was found, characterized by an                         intact appearance, healthy appearing mucosa and                         visible sutures.                        - Normal mucosa was found in the gastric body.                         Biopsied.                        - Z-line regular.                        - Esophagogastric landmarks identified.                        - Non-bleeding jejunal diverticulum. Recommendation:        - Patient has a contact number available for                         emergencies. The signs and symptoms of potential  delayed complications were discussed with the patient.                         Return to normal activities tomorrow. Written                         discharge instructions were provided to the patient.                        - Discharge patient to home.                        - Resume previous diet.                        - Continue present medications.                        - Await pathology results.                        - Return to GI clinic as previously scheduled.                        - The findings and recommendations were discussed with                         the patient. Procedure Code(s):     --- Professional ---                        [phone], Esophagogastroduodenoscopy, flexible,                          transoral; with biopsy, single or multiple Diagnosis Code(s):     --- Professional ---                        Z98.84, Bariatric surgery status                        R12, Heartburn                        K57.10, Diverticulosis of small intestine without                         perforation or abscess without bleeding CPT copyright 2022 [employer] Association. All rights reserved. The codes documented in this report are preliminary and upon coder review may  be revised to meet current compliance requirements. Attending Participation:      I personally performed the entire procedure. Elfredia Nevins, DO Jaynie Collins DO, DO 11/24/2022 9:32:38 AM This report has been signed electronically. Number of Addenda: 0 Note Initiated On: 11/24/2022 9:13 AM Estimated Blood Loss:  Estimated blood loss was minimal.      [facility]

## 2022-11-24 NOTE — Anesthesia Postprocedure Evaluation (Signed)
Anesthesia Post Note  Patient: Alexandria Newman  Procedure(s) Performed: COLONOSCOPY WITH PROPOFOL ESOPHAGOGASTRODUODENOSCOPY (EGD) WITH PROPOFOL BIOPSY POLYPECTOMY  Patient location during evaluation: PACU Anesthesia Type: General Level of consciousness: awake and alert Pain management: pain level controlled Vital Signs Assessment: post-procedure vital signs reviewed and stable Respiratory status: spontaneous breathing, nonlabored ventilation, respiratory function stable and patient connected to nasal cannula oxygen Cardiovascular status: blood pressure returned to baseline and stable Postop Assessment: no apparent nausea or vomiting Anesthetic complications: no   No notable events documented.   Last Vitals:  Vitals:   11/24/22 0832 11/24/22 1005  BP: (!) 186/74 102/63  Pulse: 60 (!) 58  Resp: 16 14  Temp: (!) 35.9 C (!) 35.8 C  SpO2: 96% 98%    Last Pain:  Vitals:   11/24/22 1005  TempSrc: Temporal  PainSc: Asleep                 Yevette Edwards

## 2022-11-24 NOTE — Op Note (Signed)
[facility] Gastroenterology Patient Name: Alexandria Newman Procedure Date: 11/24/2022 9:11 AM MRN: [id number] Account #: [id number] Date of Birth: [birth year] Admit Type: Outpatient Age: 72 Room: [facility] ENDO ROOM 1 Gender: Female Note Status: Finalized Instrument Name: Peds Colonoscope [id number] Procedure:             Colonoscopy Indications:           Chronic diarrhea Providers:             Jaynie Collins DO, DO Referring MD:          Barbette Reichmann, MD (Referring MD) Medicines:             Monitored Anesthesia Care Complications:         No immediate complications. Estimated blood loss:                         Minimal. Procedure:             Pre-Anesthesia Assessment:                        - Prior to the procedure, a History and Physical was                         performed, and patient medications and allergies were                         reviewed. The patient is competent. The risks and                         benefits of the procedure and the sedation options and                         risks were discussed with the patient. All questions                         were answered and informed consent was obtained.                         Patient identification and proposed procedure were                         verified by the physician, the nurse, the anesthetist                         and the technician in the endoscopy suite. Mental                         Status Examination: alert and oriented. Airway                         Examination: normal oropharyngeal airway and neck                         mobility. Respiratory Examination: clear to                         auscultation. CV Examination: RRR, no murmurs, no S3  or S4. Prophylactic Antibiotics: The patient does not                         require prophylactic antibiotics. Prior                         Anticoagulants: The patient has taken no anticoagulant                          or antiplatelet agents. ASA Grade Assessment: III - A                         patient with severe systemic disease. After reviewing                         the risks and benefits, the patient was deemed in                         satisfactory condition to undergo the procedure. The                         anesthesia plan was to use monitored anesthesia care                         (MAC). Immediately prior to administration of                         medications, the patient was re-assessed for adequacy                         to receive sedatives. The heart rate, respiratory                         rate, oxygen saturations, blood pressure, adequacy of                         pulmonary ventilation, and response to care were                         monitored throughout the procedure. The physical                         status of the patient was re-assessed after the                         procedure.                        After obtaining informed consent, the colonoscope was                         passed under direct vision. Throughout the procedure,                         the patient's blood pressure, pulse, and oxygen                         saturations were monitored continuously. The  Colonoscope was introduced through the anus and                         advanced to the the terminal ileum, with                         identification of the appendiceal orifice and IC                         valve. The colonoscopy was performed without                         difficulty. The patient tolerated the procedure well.                         The quality of the bowel preparation was evaluated                         using the BBPS [facility] Bowel Preparation Scale) with                         scores of: Right Colon = 1 (portion of mucosa seen,                         but other areas not well seen due to staining,                         residual stool and/or  opaque liquid), Transverse Colon                         = 1 (portion of mucosa seen, but other areas not well                         seen due to staining, residual stool and/or opaque                         liquid) and Left Colon = 2 (minor amount of residual                         staining, small fragments of stool and/or opaque                         liquid, but mucosa seen well). The total BBPS score                         equals 4. The quality of the bowel preparation was                         inadequate. The terminal ileum, ileocecal valve,                         appendiceal orifice, and rectum were photographed. Findings:      The perianal and digital rectal examinations were normal. Pertinent       negatives include normal sphincter tone.      The terminal ileum appeared normal. Biopsies were taken with a cold       forceps  for histology. Estimated blood loss was minimal.      A moderate amount of stool was found in the entire colon, interfering       with visualization. No large lesions. Cannot rule out small to medium       sized lesions.      Non-bleeding internal hemorrhoids were found during retroflexion. The       hemorrhoids were Grade I (internal hemorrhoids that do not prolapse).       Estimated blood loss: none.      Two sessile polyps were found in the rectum and descending colon. The       polyps were 1 to 2 mm in size. These polyps were removed with a jumbo       cold forceps. Resection and retrieval were complete. Estimated blood       loss was minimal.      Normal mucosa was found in the entire colon. Biopsies for histology were       taken with a cold forceps from the right colon and left colon for       evaluation of microscopic colitis. Estimated blood loss was minimal.      The exam was otherwise without abnormality on direct and retroflexion       views. Impression:            - Preparation of the colon was inadequate.                        - The  examined portion of the ileum was normal.                         Biopsied.                        - Stool in the entire examined colon.                        - Non-bleeding internal hemorrhoids.                        - Two 1 to 2 mm polyps in the rectum and in the                         descending colon, removed with a jumbo cold forceps.                         Resected and retrieved.                        - Normal mucosa in the entire examined colon. Biopsied.                        - The examination was otherwise normal on direct and                         retroflexion views. Recommendation:        - Patient has a contact number available for                         emergencies. The signs and symptoms of potential  delayed complications were discussed with the patient.                         Return to normal activities tomorrow. Written                         discharge instructions were provided to the patient.                        - Discharge patient to home.                        - Resume previous diet.                        - Continue present medications.                        - Await pathology results.                        - Repeat colonoscopy 6-12 months because the bowel                         preparation was poor.                        - Return to GI office as previously scheduled.                        - Consider SIBO testing                        Recommend fiber initiation.                        Recommend initiation of cholestyramine or colestipol                         for diarrhea                        - The findings and recommendations were discussed with                         the patient. Procedure Code(s):     --- Professional ---                        [phone], Colonoscopy, flexible; with biopsy, single or                         multiple Diagnosis Code(s):     --- Professional ---                        K64.0, First  degree hemorrhoids                        D12.8, Benign neoplasm of rectum                        D12.4, Benign neoplasm of descending colon  K52.9, Noninfective gastroenteritis and colitis,                         unspecified CPT copyright 2022 American Medical Association. All rights reserved. The codes documented in this report are preliminary and upon coder review may  be revised to meet current compliance requirements. Attending Participation:      I personally performed the entire procedure. Elfredia Nevins, DO Jaynie Collins DO, DO 11/24/2022 10:09:26 AM This report has been signed electronically. Number of Addenda: 0 Note Initiated On: 11/24/2022 9:11 AM Scope Withdrawal Time: 0 hours 18 minutes 10 seconds  Total Procedure Duration: 0 hours 25 minutes 10 seconds  Estimated Blood Loss:  Estimated blood loss was minimal.      [facility]

## 2022-11-24 NOTE — Anesthesia Preprocedure Evaluation (Signed)
Anesthesia Evaluation  Patient identified by MRN, date of birth, ID band Patient awake    Reviewed: Allergy & Precautions, H&P , NPO status , Patient's Chart, lab work & pertinent test results, reviewed documented beta blocker date and time   Airway Mallampati: II   Neck ROM: full    Dental  (+) Poor Dentition   Pulmonary neg pulmonary ROS   Pulmonary exam normal        Cardiovascular Exercise Tolerance: Good hypertension, On Medications negative cardio ROS Normal cardiovascular exam Rhythm:regular Rate:Normal     Neuro/Psych   Anxiety Depression    negative neurological ROS  negative psych ROS   GI/Hepatic negative GI ROS, Neg liver ROS,,,  Endo/Other  negative endocrine ROS    Renal/GU negative Renal ROS  negative genitourinary   Musculoskeletal   Abdominal   Peds  Hematology  (+) Blood dyscrasia, anemia   Anesthesia Other Findings Past Medical History: No date: Anemia No date: Anxiety No date: B12 deficiency No date: Chronic insomnia No date: COVID-19     Comment:  10-15-20 No date: Degeneration of cervical intervertebral disc No date: Depression 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     Comment:  per pt had "part of intestines removed" No date: Dysfunction of both eustachian tubes No date: History of kidney stones No date: Hypercholesteremia No date: Hypertension No date: Metabolic syndrome No date: Obesity No date: Osteoarthritis of right hand No date: Ovarian failure No date: TMJ (dislocation of temporomandibular joint) No date: Vitamin D deficiency Past Surgical History: No date: ABDOMINAL HYSTERECTOMY No date: APPENDECTOMY 03/21/2016: COLONOSCOPY WITH PROPOFOL; N/A     Comment:  Procedure: COLONOSCOPY WITH PROPOFOL;  Surgeon: Wyline Mood, MD;  Location: [facility] ENDOSCOPY;  Service: Endoscopy;              Laterality: N/A; 03/21/2016: ESOPHAGOGASTRODUODENOSCOPY (EGD) WITH PROPOFOL; N/A      Comment:  Procedure: ESOPHAGOGASTRODUODENOSCOPY (EGD) WITH               PROPOFOL;  Surgeon: Wyline Mood, MD;  Location: [facility]               ENDOSCOPY;  Service: Endoscopy;  Laterality: N/A; 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 11/10/2020: LAPAROSCOPIC CHOLECYSTECTOMY SINGLE SITE WITH  INTRAOPERATIVE CHOLANGIOGRAM; N/A     Comment:  Procedure: LAPAROSCOPIC CHOLECYSTECTOMY SINGLE SITE WITH              INTRAOPERATIVE CHOLANGIOGRAM;  Surgeon: Kinsinger, De Blanch, MD;  Location: [facility] ORS;  Service: General;                Laterality: N/A;  58 01/05/2014: LAPAROSCOPY; N/A     Comment:  Procedure: LAPAROSCOPY DIAGNOSTIC CONVERTED TO                LAPAROTOMY ILECTOMY;  Surgeon: Harriette Bouillon, MD;                Location: [facility] OR;  Service: General;  Laterality: N/A; No date: PARTIAL COLECTOMY No date: WISDOM TOOTH EXTRACTION BMI    Body Mass Index: 29.01 kg/m     Reproductive/Obstetrics negative OB ROS                             Anesthesia Physical Anesthesia Plan  ASA: 3  Anesthesia Plan: General   Post-op Pain  Management:    Induction:   PONV Risk Score and Plan:   Airway Management Planned:   Additional Equipment:   Intra-op Plan:   Post-operative Plan:   Informed Consent: I have reviewed the patients History and Physical, chart, labs and discussed the procedure including the risks, benefits and alternatives for the proposed anesthesia with the patient or authorized representative who has indicated his/her understanding and acceptance.     Dental Advisory Given  Plan Discussed with: CRNA  Anesthesia Plan Comments:        Anesthesia Quick Evaluation

## 2022-11-24 NOTE — Transfer of Care (Signed)
Immediate Anesthesia Transfer of Care Note  Patient: Alexandria Newman  Procedure(s) Performed: COLONOSCOPY WITH PROPOFOL ESOPHAGOGASTRODUODENOSCOPY (EGD) WITH PROPOFOL BIOPSY POLYPECTOMY  Patient Location: PACU  Anesthesia Type:MAC  Level of Consciousness: awake  Airway & Oxygen Therapy: Patient Spontanous Breathing  Post-op Assessment: Report given to RN and Post -op Vital signs reviewed and stable  Post vital signs: Reviewed and stable  Last Vitals:  Vitals Value Taken Time  BP    Temp    Pulse    Resp    SpO2      Last Pain:  Vitals:   11/24/22 0832  TempSrc: Temporal         Complications: No notable events documented.

## 2022-11-25 ENCOUNTER — Encounter: Payer: Self-pay | Admitting: Gastroenterology

## 2022-12-28 DIAGNOSIS — E538 Deficiency of other specified B group vitamins: Secondary | ICD-10-CM | POA: Diagnosis not present

## 2023-01-18 DIAGNOSIS — D2239 Melanocytic nevi of other parts of face: Secondary | ICD-10-CM | POA: Diagnosis not present

## 2023-01-18 DIAGNOSIS — D692 Other nonthrombocytopenic purpura: Secondary | ICD-10-CM | POA: Diagnosis not present

## 2023-01-18 DIAGNOSIS — L821 Other seborrheic keratosis: Secondary | ICD-10-CM | POA: Diagnosis not present

## 2023-01-18 DIAGNOSIS — L918 Other hypertrophic disorders of the skin: Secondary | ICD-10-CM | POA: Diagnosis not present

## 2023-01-18 DIAGNOSIS — L820 Inflamed seborrheic keratosis: Secondary | ICD-10-CM | POA: Diagnosis not present

## 2023-01-30 DIAGNOSIS — R001 Bradycardia, unspecified: Secondary | ICD-10-CM | POA: Diagnosis not present

## 2023-01-30 DIAGNOSIS — E876 Hypokalemia: Secondary | ICD-10-CM | POA: Diagnosis not present

## 2023-01-30 DIAGNOSIS — I2694 Multiple subsegmental pulmonary emboli without acute cor pulmonale: Secondary | ICD-10-CM | POA: Diagnosis not present

## 2023-01-30 DIAGNOSIS — F3341 Major depressive disorder, recurrent, in partial remission: Secondary | ICD-10-CM | POA: Diagnosis not present

## 2023-01-30 DIAGNOSIS — D508 Other iron deficiency anemias: Secondary | ICD-10-CM | POA: Diagnosis not present

## 2023-01-30 DIAGNOSIS — I10 Essential (primary) hypertension: Secondary | ICD-10-CM | POA: Diagnosis not present

## 2023-01-30 DIAGNOSIS — Z9884 Bariatric surgery status: Secondary | ICD-10-CM | POA: Diagnosis not present

## 2023-02-02 DIAGNOSIS — E538 Deficiency of other specified B group vitamins: Secondary | ICD-10-CM | POA: Diagnosis not present

## 2023-02-02 DIAGNOSIS — E876 Hypokalemia: Secondary | ICD-10-CM | POA: Diagnosis not present

## 2023-02-02 DIAGNOSIS — D649 Anemia, unspecified: Secondary | ICD-10-CM | POA: Diagnosis not present

## 2023-02-02 DIAGNOSIS — Z23 Encounter for immunization: Secondary | ICD-10-CM | POA: Diagnosis not present

## 2023-02-02 DIAGNOSIS — F324 Major depressive disorder, single episode, in partial remission: Secondary | ICD-10-CM | POA: Diagnosis not present

## 2023-02-02 DIAGNOSIS — I10 Essential (primary) hypertension: Secondary | ICD-10-CM | POA: Diagnosis not present

## 2023-02-02 DIAGNOSIS — I700 Atherosclerosis of aorta: Secondary | ICD-10-CM | POA: Diagnosis not present

## 2023-02-02 DIAGNOSIS — Z8639 Personal history of other endocrine, nutritional and metabolic disease: Secondary | ICD-10-CM | POA: Diagnosis not present

## 2023-02-20 DIAGNOSIS — J019 Acute sinusitis, unspecified: Secondary | ICD-10-CM | POA: Diagnosis not present

## 2023-02-20 DIAGNOSIS — H6993 Unspecified Eustachian tube disorder, bilateral: Secondary | ICD-10-CM | POA: Diagnosis not present

## 2023-03-08 DIAGNOSIS — I10 Essential (primary) hypertension: Secondary | ICD-10-CM | POA: Diagnosis not present

## 2023-04-11 DIAGNOSIS — A048 Other specified bacterial intestinal infections: Secondary | ICD-10-CM | POA: Diagnosis not present

## 2023-04-11 DIAGNOSIS — K219 Gastro-esophageal reflux disease without esophagitis: Secondary | ICD-10-CM | POA: Diagnosis not present

## 2023-04-11 DIAGNOSIS — E538 Deficiency of other specified B group vitamins: Secondary | ICD-10-CM | POA: Diagnosis not present

## 2023-05-12 DIAGNOSIS — A048 Other specified bacterial intestinal infections: Secondary | ICD-10-CM | POA: Diagnosis not present

## 2023-05-12 DIAGNOSIS — E538 Deficiency of other specified B group vitamins: Secondary | ICD-10-CM | POA: Diagnosis not present

## 2023-05-12 DIAGNOSIS — K219 Gastro-esophageal reflux disease without esophagitis: Secondary | ICD-10-CM | POA: Diagnosis not present

## 2023-06-21 DIAGNOSIS — E538 Deficiency of other specified B group vitamins: Secondary | ICD-10-CM | POA: Diagnosis not present

## 2023-06-27 ENCOUNTER — Other Ambulatory Visit
Admission: RE | Admit: 2023-06-27 | Discharge: 2023-06-27 | Disposition: A | Discharge status: Home / Self Care | Source: Ambulatory Visit | Attending: Nurse Practitioner | Admitting: Nurse Practitioner

## 2023-06-27 DIAGNOSIS — H539 Unspecified visual disturbance: Secondary | ICD-10-CM | POA: Diagnosis not present

## 2023-06-27 DIAGNOSIS — I10 Essential (primary) hypertension: Secondary | ICD-10-CM | POA: Diagnosis not present

## 2023-06-27 DIAGNOSIS — R519 Headache, unspecified: Secondary | ICD-10-CM | POA: Diagnosis not present

## 2023-06-27 DIAGNOSIS — R079 Chest pain, unspecified: Secondary | ICD-10-CM | POA: Insufficient documentation

## 2023-06-27 DIAGNOSIS — Z86711 Personal history of pulmonary embolism: Secondary | ICD-10-CM | POA: Diagnosis not present

## 2023-06-27 DIAGNOSIS — E7800 Pure hypercholesterolemia, unspecified: Secondary | ICD-10-CM | POA: Diagnosis not present

## 2023-06-27 DIAGNOSIS — R002 Palpitations: Secondary | ICD-10-CM | POA: Diagnosis not present

## 2023-06-27 LAB — D-DIMER, QUANTITATIVE: D-Dimer, Quant: 1.27 ug{FEU}/mL — ABNORMAL HIGH (ref 0.00–0.50)

## 2023-06-28 ENCOUNTER — Emergency Department (HOSPITAL_COMMUNITY)

## 2023-06-28 ENCOUNTER — Emergency Department (HOSPITAL_COMMUNITY)
Admission: EM | Admit: 2023-06-28 | Discharge: 2023-06-28 | Disposition: A | Discharge status: Home / Self Care | Attending: Emergency Medicine | Admitting: Emergency Medicine

## 2023-06-28 ENCOUNTER — Other Ambulatory Visit: Payer: Self-pay

## 2023-06-28 DIAGNOSIS — R7989 Other specified abnormal findings of blood chemistry: Secondary | ICD-10-CM | POA: Diagnosis not present

## 2023-06-28 DIAGNOSIS — R072 Precordial pain: Secondary | ICD-10-CM | POA: Diagnosis not present

## 2023-06-28 DIAGNOSIS — R079 Chest pain, unspecified: Secondary | ICD-10-CM

## 2023-06-28 DIAGNOSIS — I2510 Atherosclerotic heart disease of native coronary artery without angina pectoris: Secondary | ICD-10-CM | POA: Diagnosis not present

## 2023-06-28 DIAGNOSIS — R0789 Other chest pain: Secondary | ICD-10-CM | POA: Diagnosis not present

## 2023-06-28 DIAGNOSIS — R0602 Shortness of breath: Secondary | ICD-10-CM | POA: Diagnosis not present

## 2023-06-28 DIAGNOSIS — I3139 Other pericardial effusion (noninflammatory): Secondary | ICD-10-CM | POA: Diagnosis not present

## 2023-06-28 LAB — CBC WITH DIFFERENTIAL/PLATELET
Abs Immature Granulocytes: 0.02 10*3/uL (ref 0.00–0.07)
Basophils Absolute: 0.1 10*3/uL (ref 0.0–0.1)
Basophils Relative: 1 %
Eosinophils Absolute: 0.3 10*3/uL (ref 0.0–0.5)
Eosinophils Relative: 4 %
HCT: 34.7 % — ABNORMAL LOW (ref 36.0–46.0)
Hemoglobin: 11.2 g/dL — ABNORMAL LOW (ref 12.0–15.0)
Immature Granulocytes: 0 %
Lymphocytes Relative: 27 %
Lymphs Abs: 2 10*3/uL (ref 0.7–4.0)
MCH: 30.2 pg (ref 26.0–34.0)
MCHC: 32.3 g/dL (ref 30.0–36.0)
MCV: 93.5 fL (ref 80.0–100.0)
Monocytes Absolute: 0.5 10*3/uL (ref 0.1–1.0)
Monocytes Relative: 7 %
Neutro Abs: 4.6 10*3/uL (ref 1.7–7.7)
Neutrophils Relative %: 61 %
Platelets: 276 10*3/uL (ref 150–400)
RBC: 3.71 MIL/uL — ABNORMAL LOW (ref 3.87–5.11)
RDW: 14.3 % (ref 11.5–15.5)
WBC: 7.5 10*3/uL (ref 4.0–10.5)
nRBC: 0 % (ref 0.0–0.2)

## 2023-06-28 LAB — I-STAT CHEM 8, ED
BUN: 26 mg/dL — ABNORMAL HIGH (ref 8–23)
Calcium, Ion: 1.15 mmol/L (ref 1.15–1.40)
Chloride: 108 mmol/L (ref 98–111)
Creatinine, Ser: 1.7 mg/dL — ABNORMAL HIGH (ref 0.44–1.00)
Glucose, Bld: 101 mg/dL — ABNORMAL HIGH (ref 70–99)
HCT: 33 % — ABNORMAL LOW (ref 36.0–46.0)
Hemoglobin: 11.2 g/dL — ABNORMAL LOW (ref 12.0–15.0)
Potassium: 2.8 mmol/L — ABNORMAL LOW (ref 3.5–5.1)
Sodium: 141 mmol/L (ref 135–145)
TCO2: 21 mmol/L — ABNORMAL LOW (ref 22–32)

## 2023-06-28 LAB — BASIC METABOLIC PANEL
Anion gap: 10 (ref 5–15)
BUN: 26 mg/dL — ABNORMAL HIGH (ref 8–23)
CO2: 22 mmol/L (ref 22–32)
Calcium: 8.9 mg/dL (ref 8.9–10.3)
Chloride: 108 mmol/L (ref 98–111)
Creatinine, Ser: 1.59 mg/dL — ABNORMAL HIGH (ref 0.44–1.00)
GFR, Estimated: 34 mL/min — ABNORMAL LOW (ref >60)
Glucose, Bld: 111 mg/dL — ABNORMAL HIGH (ref 70–99)
Potassium: 2.9 mmol/L — ABNORMAL LOW (ref 3.5–5.1)
Sodium: 140 mmol/L (ref 135–145)

## 2023-06-28 LAB — TROPONIN I (HIGH SENSITIVITY): Troponin I (High Sensitivity): 4 ng/L (ref <18)

## 2023-06-28 MED ORDER — IOHEXOL 350 MG/ML SOLN
75.0000 mL | Freq: Once | INTRAVENOUS | Status: AC | PRN
  Administered 2023-06-28: 75 mL via INTRAVENOUS

## 2023-06-28 MED ORDER — POTASSIUM CHLORIDE CRYS ER 20 MEQ PO TBCR
40.0000 meq | EXTENDED_RELEASE_TABLET | Freq: Once | ORAL | Status: AC
  Administered 2023-06-28: 40 meq via ORAL
  Filled 2023-06-28: qty 2

## 2023-06-28 MED ORDER — SODIUM CHLORIDE 0.9 % IV BOLUS
1000.0000 mL | Freq: Once | INTRAVENOUS | Status: AC
  Administered 2023-06-28: 1000 mL via INTRAVENOUS

## 2023-06-28 NOTE — ED Notes (Signed)
 Saline Lock - left AC  20g.

## 2023-06-28 NOTE — Discharge Instructions (Addendum)
 We evaluated you for your abnormal D-dimer test.  We obtained a CT scan of your lungs which did not show any signs of a blood clot.  Please follow-up with your cardiologist and your primary care doctor.  Your laboratory testing also showed signs of dehydration.  Please follow-up with your primary care doctor to have your laboratory test rechecked in around 1 week or sooner if possible.  Please be sure to stay hydrated.  If you develop any new or worsening symptoms such as severe pain, difficulty breathing, lightheadedness or dizziness, fainting, or any other concerning symptoms, please return to the emergency department.

## 2023-06-28 NOTE — ED Triage Notes (Signed)
 Pt. Stated, Ive had chest pain with SOB. I was at the office at my Dr. And my D-Dimer was up. I had a hx of blood clots before with the same symptoms.

## 2023-06-28 NOTE — ED Provider Notes (Signed)
   ED Course / MDM   Clinical Course as of 06/28/23 1712  Wed Jun 28, 2023  1120 Patient's GFR is low at 34.  I reviewed with radiology and they said that was okay.  Giving her IV fluids and supplementing her potassium which is also low. [MB]  1506 Received sign out from Dr. Charm Barges pending CT chest. Hx of PE [WS]  1711 CT chest negative for PE.  Discussed results with the patient, as well as her abnormal laboratory testing which showed signs of dehydration with elevated creatinine and hypokalemia.  Patient received potassium in the emergency department as well as IV fluids.  Recommended increase hydration at home and follow-up with primary doctor for recheck.  Given her recent chest pain, also recommended that she follow-up with her cardiologist and placed a cardiology referral. Will discharge patient to home. All questions answered. Patient comfortable with plan of discharge. Return precautions discussed with patient and specified on the after visit summary. [WS]    Clinical Course User Index [MB] Terrilee Files, MD [WS] Lonell Grandchild, MD   Medical Decision Making Amount and/or Complexity of Data Reviewed Labs: ordered.  Risk Prescription drug management.          Lonell Grandchild, MD 06/28/23 [phone]

## 2023-06-28 NOTE — ED Provider Triage Note (Signed)
 Emergency Medicine Provider Triage Evaluation Note  Alexandria Newman [facility] , a 73 y.o. female  was evaluated in triage.  Pt complains of pain to the center of her chest intermittently for 2 weeks.    Has a prior history of PE and was previously on Coumadin although stopped at last May.  She says her pain feels similar to when she had the PEs.  She feels a little bit more short of breath today than her baseline but she feels that is from walking along way into the ED.Marland Kitchen  Her PCP did a D-dimer yesterday that was elevated.  Review of Systems  Positive: Chest pain, mild shortness of breath Negative: Leg swelling, cough, fever  Physical Exam  BP (!) 116/55 (BP Location: Right Arm)   Pulse 73   Temp 98.1 F (36.7 C)   Resp 18   SpO2 100%  Gen:   Awake, no distress    Resp:  Normal effort   MSK:   Moves extremities without difficulty   Other:     Medical Decision Making  Medically screening exam initiated at 9:52 AM.  Appropriate orders placed.  Alexandria Newman was informed that the remainder of the evaluation will be completed by another provider, this initial triage assessment does not replace that evaluation, and the importance of remaining in the ED until their evaluation is complete.      Rolan Bucco, MD 06/28/23 [phone]

## 2023-06-28 NOTE — ED Provider Notes (Signed)
 [place] EMERGENCY DEPARTMENT AT [facility] Provider Note   CSN: [id number] Arrival date & time: 06/28/23  0945     History  Chief Complaint  Patient presents with   Chest Pain   Shortness of Breath    Kerilyn Cortner [facility] is a 73 y.o. female.  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  She saw her primary care doctor yesterday for some intermittent chest pain similar to her PE that is been going on for the last 2 weeks.  She did not really notice any shortness of breath but she said she experienced some today while walking here and to the department.  She had some labs done yesterday showing a positive D-dimer and so her doctor sent her here to get a CAT scan.  She has had some ongoing abdominal and back pain that sounds more chronic.  No hemoptysis or fever.  The history is provided by the patient.  Chest Pain Pain location:  Substernal area Pain quality: aching   Pain radiates to:  Does not radiate Pain severity:  Mild Onset quality:  Gradual Duration:  2 weeks Timing:  Intermittent Progression:  Unchanged Relieved by:  None tried Worsened by:  Nothing Ineffective treatments:  None tried Associated symptoms: back pain   Associated symptoms: no nausea   Risk factors: prior DVT/PE        Home Medications Prior to Admission medications   Medication Sig Start Date End Date Taking? Authorizing Provider  famotidine (PEPCID) 40 MG tablet Take 1 tablet by mouth at bedtime. 04/11/23 04/10/24 Yes [provider]  hydrochlorothiazide (HYDRODIURIL) 12.5 MG tablet Take by mouth. 03/08/23 03/07/24 Yes [provider]  iron polysaccharides (NIFEREX) 150 MG capsule Take by mouth. 03/20/23  Yes [provider]  metoprolol succinate (TOPROL-XL) 25 MG 24 hr tablet Take 1 tablet by mouth daily. 02/02/23 02/02/24 Yes [provider]  pantoprazole (PROTONIX) 40 MG tablet Take by mouth. 04/11/23  Yes [provider]  acetaminophen (TYLENOL) 325 MG tablet Take 2 tablets (650 mg total) by mouth every 6 (six) hours as needed for mild pain (or Temp > 100). 11/07/13   Sherrie George, PA-C  ALPRAZolam Prudy Feeler) 0.5 MG tablet Take 0.25 mg by mouth at bedtime. 07/16/20   [provider]  Artificial Tear Ointment (DRY EYES OP) Place 1 drop into both eyes daily as needed (dry eyes).    [provider]  atenolol (TENORMIN) 25 MG tablet Take 1 tablet (25 mg total) by mouth daily. 06/02/17   Alba Cory, MD  buPROPion (WELLBUTRIN XL) 150 MG 24 hr tablet TAKE 1 TABLET EVERY DAY 09/25/17   Alba Cory, MD  citalopram (CELEXA) 40 MG tablet Take 40 mg by mouth in the morning. 08/08/20   [provider]  cyanocobalamin (,VITAMIN B-12,) 1000 MCG/ML injection Inject 1,000 mcg into the muscle every 30 (thirty) days.    [provider]  hydrALAZINE (APRESOLINE) 50 MG tablet Take 50 mg by mouth 2 (two) times daily. 10/21/20   [provider]  lisinopril (ZESTRIL) 30 MG tablet Take 30 mg by mouth in the morning.    [provider]  metoprolol tartrate (LOPRESSOR) 25 MG tablet Take 25 mg by mouth 2 (two) times daily.    [provider]  oxyCODONE (OXY IR/ROXICODONE) 5 MG immediate release tablet Take 1 tablet (5 mg total) by mouth every 6 (six) hours as needed for severe pain. Patient not taking:  Reported on 11/24/2022 11/10/20   Kinsinger, De Blanch, MD  atorvastatin (LIPITOR) 40 MG tablet Take 1 tablet (40 mg total) by mouth every evening. Patient not taking: Reported on 10/15/2020 06/02/17 10/15/20  Alba Cory, MD  temazepam (RESTORIL) 15 MG capsule Take 1 capsule (15 mg total) by mouth at bedtime as needed for sleep. Patient not taking: No sig reported 06/02/17 10/15/20  Alba Cory, MD      Allergies    Patient has no known allergies.    Review of Systems   Review of Systems  Cardiovascular:  Positive for chest pain.  Gastrointestinal:  Negative  for nausea.  Musculoskeletal:  Positive for back pain.    Physical Exam Updated Vital Signs BP (!) 116/55 (BP Location: Right Arm)   Pulse 73   Temp 98.1 F (36.7 C)   Resp 18   Ht 5\' 4"  (1.626 m)   Wt 74.4 kg   SpO2 100%   BMI 28.15 kg/m  Physical Exam Vitals and nursing note reviewed.  Constitutional:      General: She is not in acute distress.    Appearance: She is well-developed.  HENT:     Head: Normocephalic and atraumatic.  Eyes:     Conjunctiva/sclera: Conjunctivae normal.  Cardiovascular:     Rate and Rhythm: Normal rate and regular rhythm.     Heart sounds: Normal heart sounds. No murmur heard. Pulmonary:     Effort: Pulmonary effort is normal. No respiratory distress.     Breath sounds: Normal breath sounds.  Abdominal:     Palpations: Abdomen is soft.     Tenderness: There is no abdominal tenderness.  Musculoskeletal:        General: No swelling.     Cervical back: Neck supple.     Right lower leg: No tenderness. No edema.     Left lower leg: No tenderness. No edema.  Skin:    General: Skin is warm and dry.     Capillary Refill: Capillary refill takes less than 2 seconds.  Neurological:     General: No focal deficit present.     Mental Status: She is alert.  Psychiatric:        Mood and Affect: Mood normal.     ED Results / Procedures / Treatments   Labs (all labs ordered are listed, but only abnormal results are displayed) Labs Reviewed  BASIC METABOLIC PANEL - Abnormal; Notable for the following components:      Result Value   Potassium 2.9 (*)    Glucose, Bld 111 (*)    BUN 26 (*)    Creatinine, Ser 1.59 (*)    GFR, Estimated 34 (*)    All other components within normal limits  CBC WITH DIFFERENTIAL/PLATELET - Abnormal; Notable for the following components:   RBC 3.71 (*)    Hemoglobin 11.2 (*)    HCT 34.7 (*)    All other components within normal limits  I-STAT CHEM 8, ED - Abnormal; Notable for the following components:   Potassium  2.8 (*)    BUN 26 (*)    Creatinine, Ser 1.70 (*)    Glucose, Bld 101 (*)    TCO2 21 (*)    Hemoglobin 11.2 (*)    HCT 33.0 (*)    All other components within normal limits  MAGNESIUM  TROPONIN I (HIGH SENSITIVITY)    EKG EKG Interpretation Date/Time:  Wednesday June 28 2023 10:00:08 EST Ventricular Rate:  72 PR Interval:  156 QRS  Duration:  94 QT Interval:  410 QTC Calculation: 448 R Axis:   75  Text Interpretation: Normal sinus rhythm Normal ECG When compared with ECG of 05-Nov-2017 09:53, PREVIOUS ECG IS PRESENT since last tracing no significant change Confirmed by Rolan Bucco [phone]) on 06/28/2023 10:01:46 AM  Radiology No results found.  Procedures Procedures    Medications Ordered in ED Medications  sodium chloride 0.9 % bolus 1,000 mL (has no administration in time range)  potassium chloride SA (KLOR-CON M) CR tablet 40 mEq (has no administration in time range)    ED Course/ Medical Decision Making/ A&P Clinical Course as of 06/28/23 1705  Wed Jun 28, 2023  1120 Patient's GFR is low at 34.  I reviewed with radiology and they said that was okay.  Giving her IV fluids and supplementing her potassium which is also low. [MB]  1506 Received sign out from Dr. Charm Barges pending CT chest. Hx of PE [WS]    Clinical Course User Index [MB] Terrilee Files, MD [WS] Lonell Grandchild, MD                                 Medical Decision Making Amount and/or Complexity of Data Reviewed Labs: ordered.  Risk Prescription drug management.   This patient complains of pain in her chest similar to prior PE; this involves an extensive number of treatment Options and is a complaint that carries with it a high risk of complications and morbidity. The differential includes ACS, pneumonia, pneumothorax, PE, vascular, musculoskeletal  I ordered, reviewed and interpreted labs, which included CBC with normal white count, stable low hemoglobin, chemistries with low potassium  elevated creatinine, troponins flat I ordered medication IV fluids oral potassium and reviewed PMP when indicated. I ordered imaging studies which included CT angio chest and this is pending at time of signout  Previous records obtained and reviewed in epic including recent PCP notes Cardiac monitoring reviewed, normal sinus rhythm Social determinants considered, no significant barriers Critical Interventions: None  After the interventions stated above, I reevaluated the patient and found patient to be fairly asymptomatic satting 100% on room air Admission and further testing considered, her care is signed out to Dr. Rhoderick Moody to follow-up on final reading of CT.  If negative she likely can be discharged to follow-up with her PCP for further outpatient workup.         Final Clinical Impression(s) / ED Diagnoses Final diagnoses:  Nonspecific chest pain    Rx / DC Orders ED Discharge Orders     None         Terrilee Files, MD 06/28/23 [id number]

## 2023-07-05 ENCOUNTER — Other Ambulatory Visit: Payer: Self-pay | Admitting: Nurse Practitioner

## 2023-07-05 DIAGNOSIS — E7800 Pure hypercholesterolemia, unspecified: Secondary | ICD-10-CM

## 2023-07-05 DIAGNOSIS — R079 Chest pain, unspecified: Secondary | ICD-10-CM

## 2023-07-05 DIAGNOSIS — Z86711 Personal history of pulmonary embolism: Secondary | ICD-10-CM

## 2023-07-12 DIAGNOSIS — M8588 Other specified disorders of bone density and structure, other site: Secondary | ICD-10-CM | POA: Diagnosis not present

## 2023-07-12 DIAGNOSIS — H539 Unspecified visual disturbance: Secondary | ICD-10-CM | POA: Diagnosis not present

## 2023-07-12 DIAGNOSIS — L299 Pruritus, unspecified: Secondary | ICD-10-CM | POA: Diagnosis not present

## 2023-07-12 DIAGNOSIS — E876 Hypokalemia: Secondary | ICD-10-CM | POA: Diagnosis not present

## 2023-07-12 DIAGNOSIS — M25552 Pain in left hip: Secondary | ICD-10-CM | POA: Diagnosis not present

## 2023-07-17 DIAGNOSIS — R002 Palpitations: Secondary | ICD-10-CM | POA: Diagnosis not present

## 2023-07-19 DIAGNOSIS — R58 Hemorrhage, not elsewhere classified: Secondary | ICD-10-CM | POA: Diagnosis not present

## 2023-07-24 DIAGNOSIS — E538 Deficiency of other specified B group vitamins: Secondary | ICD-10-CM | POA: Diagnosis not present

## 2023-07-26 DIAGNOSIS — E876 Hypokalemia: Secondary | ICD-10-CM | POA: Diagnosis not present

## 2023-07-26 DIAGNOSIS — I10 Essential (primary) hypertension: Secondary | ICD-10-CM | POA: Diagnosis not present

## 2023-07-26 DIAGNOSIS — E7800 Pure hypercholesterolemia, unspecified: Secondary | ICD-10-CM | POA: Diagnosis not present

## 2023-07-26 DIAGNOSIS — F3341 Major depressive disorder, recurrent, in partial remission: Secondary | ICD-10-CM | POA: Diagnosis not present

## 2023-07-26 DIAGNOSIS — D649 Anemia, unspecified: Secondary | ICD-10-CM | POA: Diagnosis not present

## 2023-07-28 DIAGNOSIS — R5381 Other malaise: Secondary | ICD-10-CM | POA: Diagnosis not present

## 2023-07-28 DIAGNOSIS — E7800 Pure hypercholesterolemia, unspecified: Secondary | ICD-10-CM | POA: Diagnosis not present

## 2023-07-28 DIAGNOSIS — E876 Hypokalemia: Secondary | ICD-10-CM | POA: Diagnosis not present

## 2023-07-28 DIAGNOSIS — D649 Anemia, unspecified: Secondary | ICD-10-CM | POA: Diagnosis not present

## 2023-07-28 DIAGNOSIS — R5383 Other fatigue: Secondary | ICD-10-CM | POA: Diagnosis not present

## 2023-07-28 DIAGNOSIS — I10 Essential (primary) hypertension: Secondary | ICD-10-CM | POA: Diagnosis not present

## 2023-08-03 ENCOUNTER — Encounter: Payer: Self-pay | Admitting: Oncology

## 2023-08-03 DIAGNOSIS — M25552 Pain in left hip: Secondary | ICD-10-CM | POA: Diagnosis not present

## 2023-08-03 DIAGNOSIS — R5383 Other fatigue: Secondary | ICD-10-CM | POA: Diagnosis not present

## 2023-08-03 DIAGNOSIS — R790 Abnormal level of blood mineral: Secondary | ICD-10-CM | POA: Insufficient documentation

## 2023-08-03 DIAGNOSIS — F329 Major depressive disorder, single episode, unspecified: Secondary | ICD-10-CM | POA: Diagnosis not present

## 2023-08-03 DIAGNOSIS — D631 Anemia in chronic kidney disease: Secondary | ICD-10-CM | POA: Diagnosis not present

## 2023-08-03 DIAGNOSIS — N189 Chronic kidney disease, unspecified: Secondary | ICD-10-CM | POA: Diagnosis not present

## 2023-08-03 DIAGNOSIS — R5381 Other malaise: Secondary | ICD-10-CM | POA: Diagnosis not present

## 2023-08-03 DIAGNOSIS — E876 Hypokalemia: Secondary | ICD-10-CM | POA: Diagnosis not present

## 2023-08-03 DIAGNOSIS — Z0000 Encounter for general adult medical examination without abnormal findings: Secondary | ICD-10-CM | POA: Diagnosis not present

## 2023-08-03 DIAGNOSIS — G8929 Other chronic pain: Secondary | ICD-10-CM | POA: Insufficient documentation

## 2023-08-09 ENCOUNTER — Encounter: Payer: Self-pay | Admitting: Oncology

## 2023-08-09 ENCOUNTER — Inpatient Hospital Stay: Attending: Oncology | Admitting: Oncology

## 2023-08-09 ENCOUNTER — Inpatient Hospital Stay

## 2023-08-09 VITALS — BP 119/72 | HR 63 | Temp 97.7°F | Resp 19 | Ht 64.0 in | Wt 160.1 lb

## 2023-08-09 DIAGNOSIS — Z79899 Other long term (current) drug therapy: Secondary | ICD-10-CM | POA: Diagnosis not present

## 2023-08-09 DIAGNOSIS — D509 Iron deficiency anemia, unspecified: Secondary | ICD-10-CM | POA: Insufficient documentation

## 2023-08-09 DIAGNOSIS — R5383 Other fatigue: Secondary | ICD-10-CM | POA: Diagnosis not present

## 2023-08-09 DIAGNOSIS — M19041 Primary osteoarthritis, right hand: Secondary | ICD-10-CM | POA: Insufficient documentation

## 2023-08-09 DIAGNOSIS — I10 Essential (primary) hypertension: Secondary | ICD-10-CM | POA: Diagnosis not present

## 2023-08-09 DIAGNOSIS — Z8249 Family history of ischemic heart disease and other diseases of the circulatory system: Secondary | ICD-10-CM | POA: Insufficient documentation

## 2023-08-09 DIAGNOSIS — Z8349 Family history of other endocrine, nutritional and metabolic diseases: Secondary | ICD-10-CM | POA: Insufficient documentation

## 2023-08-09 DIAGNOSIS — E785 Hyperlipidemia, unspecified: Secondary | ICD-10-CM | POA: Diagnosis not present

## 2023-08-09 DIAGNOSIS — Z9884 Bariatric surgery status: Secondary | ICD-10-CM | POA: Insufficient documentation

## 2023-08-09 NOTE — Addendum Note (Signed)
 Addended by: Marilyn Shropshire on: 08/09/2023 03:45 PM   Modules accepted: Orders

## 2023-08-09 NOTE — Progress Notes (Signed)
 Hematology/Oncology Consult note [facility] Telephone:([phone] [phone]  Patient Care Team: Barbette Reichmann, MD as PCP - General (Internal Medicine) Creig Hines, MD as Consulting Physician (Oncology)   Name of the patient: Alexandria Newman  [id number]  09/15/1950    Reason for referral-iron deficiency anemia   Referring physician-Dr. Marcello Fennel  Date of visit: 08/09/23   History of presenting illness-patient is a 73 year old female with a past medical history significant for hypertension hyperlipidemia, s/p gastric bypass, osteoarthritis who has been referred for iron deficiency anemia.  CBC from 07/26/2023 showed white count of 8.7, H&H of 11.1/33.6 with an MCV of 93.3 and a platelet count of 254.  Ferritin levels were low at 34.  B12 levels normal.  TSH normal at 2.1.  Patient was last seen by me back in July 2019 for iron deficiency anemia and received IV iron in May 2018.  She was also receiving B12 injections monthly until 2019.  Patient has been trying oral iron for a couple of months but does not think it helps her.  She has not noticed any blood loss in her stool or urine.  Denies any dark melanotic stools.  Denies any vaginal bleeding.  She reports ongoing fatigue.  ECOG PS- 1  Pain scale- 0   Review of systems- Review of Systems  Constitutional:  Positive for malaise/fatigue. Negative for chills, fever and weight loss.  HENT:  Negative for congestion, ear discharge and nosebleeds.   Eyes:  Negative for blurred vision.  Respiratory:  Negative for cough, hemoptysis, sputum production, shortness of breath and wheezing.   Cardiovascular:  Negative for chest pain, palpitations, orthopnea and claudication.  Gastrointestinal:  Negative for abdominal pain, blood in stool, constipation, diarrhea, heartburn, melena, nausea and vomiting.  Genitourinary:  Negative for dysuria, flank pain, frequency, hematuria and urgency.   Musculoskeletal:  Negative for back pain, joint pain and myalgias.  Skin:  Negative for rash.  Neurological:  Negative for dizziness, tingling, focal weakness, seizures, weakness and headaches.  Endo/Heme/Allergies:  Does not bruise/bleed easily.  Psychiatric/Behavioral:  Negative for depression and suicidal ideas. The patient does not have insomnia.     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   Insomnia 02/04/2015   Osteoarthritis of finger of right hand 02/04/2015   Metabolic syndrome 02/04/2015   Depression, major, single episode, 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   Vitamin D deficiency 02/04/2015   Vitamin B12 deficiency 02/04/2015   Bariatric surgery status      Past Medical History:  Diagnosis Date   Anemia    Anxiety    B12 deficiency    Chronic insomnia    COVID-19    10-15-20   Degeneration of cervical intervertebral disc    Depression    Dumping syndrome 2016   per pt had "part of intestines removed"   Dysfunction of both eustachian tubes    History of kidney stones    Hypercholesteremia    Hypertension    Metabolic syndrome  Obesity    Osteoarthritis of right hand    Ovarian failure    TMJ (dislocation of temporomandibular joint)    Vitamin D deficiency      Past Surgical History:  Procedure Laterality Date   ABDOMINAL HYSTERECTOMY     APPENDECTOMY     BIOPSY  11/24/2022   Procedure: BIOPSY;  Surgeon: Quintin Buckle, DO;  Location: [facility] ENDOSCOPY;  Service: Gastroenterology;;   COLONOSCOPY WITH PROPOFOL N/A 03/21/2016   Procedure: COLONOSCOPY WITH PROPOFOL;  Surgeon: Luke Salaam, MD;  Location: [facility] ENDOSCOPY;  Service: Endoscopy;  Laterality: N/A;   COLONOSCOPY WITH PROPOFOL N/A 11/24/2022   Procedure: COLONOSCOPY WITH PROPOFOL;  Surgeon: Quintin Buckle, DO;  Location: [facility] ENDOSCOPY;  Service: Gastroenterology;  Laterality: N/A;   ESOPHAGOGASTRODUODENOSCOPY (EGD) WITH PROPOFOL N/A 03/21/2016   Procedure: ESOPHAGOGASTRODUODENOSCOPY (EGD) WITH PROPOFOL;  Surgeon: Luke Salaam, MD;  Location: [facility] ENDOSCOPY;  Service: Endoscopy;  Laterality: N/A;   ESOPHAGOGASTRODUODENOSCOPY (EGD) WITH PROPOFOL N/A 11/24/2022   Procedure: ESOPHAGOGASTRODUODENOSCOPY (EGD) WITH PROPOFOL;  Surgeon: Quintin Buckle, DO;  Location: [facility] ENDOSCOPY;  Service: Gastroenterology;  Laterality: N/A;   GASTRIC BYPASS  2009   LAPAROSCOPIC CHOLECYSTECTOMY SINGLE SITE WITH INTRAOPERATIVE CHOLANGIOGRAM N/A 11/10/2020   Procedure: LAPAROSCOPIC CHOLECYSTECTOMY SINGLE SITE WITH INTRAOPERATIVE CHOLANGIOGRAM;  Surgeon: Kinsinger, Alphonso Aschoff, MD;  Location: [facility] ORS;  Service: General;  Laterality: N/A;  58   LAPAROSCOPY N/A 01/05/2014   Procedure: LAPAROSCOPY DIAGNOSTIC CONVERTED TO  LAPAROTOMY ILECTOMY;  Surgeon: Sim Dryer, MD;  Location: [facility] OR;  Service: General;  Laterality: N/A;   PARTIAL COLECTOMY     POLYPECTOMY  11/24/2022   Procedure: POLYPECTOMY;  Surgeon: Quintin Buckle, DO;  Location: [facility] ENDOSCOPY;  Service: Gastroenterology;;   [place] [employer] TOOTH EXTRACTION      Social History   Socioeconomic History   Marital status: Married    Spouse name: Blaise Bumps   Number of children: 2   Years of education: GED   Highest education level: Not on file  Occupational History   Occupation: Retired  Tobacco Use   Smoking status: Never   Smokeless tobacco: Never   Tobacco comments:    smoking cessation materials not  required  Vaping Use   Vaping status: Never Used  Substance and Sexual Activity   Alcohol use: Yes    Comment: Rarely   Drug use: No   Sexual activity: Never    Birth control/protection: Surgical  Other Topics Concern   Not on file  Social History Narrative   Not on file   Social Drivers of Health   Financial Resource Strain: Low Risk  (08/03/2023)   Received from [facility]   Overall Financial Resource Strain (CARDIA)    Difficulty of Paying Living Expenses: Not hard at all  Food Insecurity: No Food Insecurity (08/03/2023)   Received from [facility]   Hunger Vital Sign    Worried About Running Out of Food in the Last Year: Never true    Ran Out of Food in the Last Year: Never true  Transportation Needs: No Transportation Needs (08/03/2023)   Received from [facility] - Transportation    In the past 12 months, has lack of transportation kept you from medical appointments or from getting medications?: No    Lack of Transportation (Non-Medical): No  Physical Activity: Insufficiently Active (06/02/2017)   Exercise Vital Sign    Days of Exercise per Week: 2 days    Minutes of Exercise per  Session: 30 min  Stress: No Stress Concern Present (06/02/2017)   [employer] of Occupational Health - Occupational Stress Questionnaire    Feeling of Stress : Not at all  Social Connections: Unknown (06/02/2017)   Social Connection and Isolation Panel [NHANES]    Frequency of Communication with Friends and Family: Patient declined    Frequency of Social Gatherings with Friends and Family: Patient declined    Attends Religious Services: Patient declined    Database administrator or Organizations: Patient declined    Attends Banker Meetings: Patient declined    Marital Status: Married  Catering manager Violence: Not At Risk (06/02/2017)   Humiliation, Afraid, Rape, and Kick questionnaire    Fear of Current or  Ex-Partner: No    Emotionally Abused: No    Physically Abused: No    Sexually Abused: No     Family History  Problem Relation Age of Onset   Heart disease Mother    CAD Mother    Kidney disease Mother    Heart attack Father    Obesity Sister    Heart attack Brother        after surgery     Current Outpatient Medications:    acetaminophen (TYLENOL) 325 MG tablet, Take 2 tablets (650 mg total) by mouth every 6 (six) hours as needed for mild pain (or Temp > 100)., Disp: , Rfl:    ALPRAZolam (XANAX) 0.5 MG tablet, Take 0.5 mg by mouth at bedtime., Disp: , Rfl:    buPROPion (WELLBUTRIN XL) 150 MG 24 hr tablet, TAKE 1 TABLET EVERY DAY, Disp: 90 tablet, Rfl: 0   citalopram (CELEXA) 40 MG tablet, Take 40 mg by mouth in the morning., Disp: , Rfl:    cyanocobalamin (,VITAMIN B-12,) 1000 MCG/ML injection, Inject 1,000 mcg into the muscle every 30 (thirty) days., Disp: , Rfl:    famotidine (PEPCID) 40 MG tablet, Take 1 tablet by mouth at bedtime. (Patient not taking: Reported on 06/28/2023), Disp: , Rfl:    hydrALAZINE (APRESOLINE) 25 MG tablet, Take 25 mg by mouth 2 (two) times daily as needed (high BP)., Disp: , Rfl:    hydrochlorothiazide (HYDRODIURIL) 12.5 MG tablet, Take 12.5 mg by mouth daily., Disp: , Rfl:    iron polysaccharides (NIFEREX) 150 MG capsule, Take 150 mg by mouth daily., Disp: , Rfl:    lisinopril (ZESTRIL) 40 MG tablet, Take 40 mg by mouth daily., Disp: , Rfl:    metoprolol succinate (TOPROL-XL) 25 MG 24 hr tablet, Take 1 tablet by mouth daily., Disp: , Rfl:    pantoprazole (PROTONIX) 40 MG tablet, Take by mouth. (Patient not taking: Reported on 06/28/2023), Disp: , Rfl:    potassium chloride (KLOR-CON) 10 MEQ tablet, Take 10 mEq by mouth daily. (Patient not taking: Reported on 06/28/2023), Disp: , Rfl:   Current Facility-Administered Medications:    cyanocobalamin ((VITAMIN B-12)) injection 1,000 mcg, 1,000 mcg, Subcutaneous, Q30 days, Alba Cory, MD, 1,000 mcg at 11/16/17  4401   Physical exam: There were no vitals filed for this visit. Physical Exam Cardiovascular:     Rate and Rhythm: Normal rate and regular rhythm.     Heart sounds: Normal heart sounds.  Pulmonary:     Effort: Pulmonary effort is normal.     Breath sounds: Normal breath sounds.  Abdominal:     General: Bowel sounds are normal.     Palpations: Abdomen is soft.  Skin:    General: Skin is warm and dry.  Neurological:  Mental Status: She is alert and oriented to person, place, and time.           Latest Ref Rng & Units 06/28/2023   10:07 AM  CMP  Glucose 70 - 99 mg/dL 045   BUN 8 - 23 mg/dL 26   Creatinine 4.09 - 1.00 mg/dL 8.11   Sodium 914 - 782 mmol/L 141   Potassium 3.5 - 5.1 mmol/L 2.8   Chloride 98 - 111 mmol/L 108       Latest Ref Rng & Units 06/28/2023   10:07 AM  CBC  Hemoglobin 12.0 - 15.0 g/dL 95.6   Hematocrit 21.3 - 46.0 % 33.0    Assessment and plan- Patient is a 73 y.o. female referred for iron deficiency anemia  Patient has evidence of mild iron deficiency anemia based on her labs on 07/26/2023.  Given her history of gastric bypass I will plan to proceed with IV iron at this time.  Discussed risks and benefits of IV iron including all but not limited to possible risk of infusion anaphylactic reaction.  Patient understands and agrees to proceed as planned.  CBC ferritin and iron studies in 2 and 4 months and I will see her back in 4 months.  Patient states that she cannot tolerate colonoscopy prep and therefore does not wish to go through any GI evaluation at this time   Thank you for this kind referral and the opportunity to participate in the care of this patient   Visit Diagnosis 1. Iron deficiency anemia, unspecified iron deficiency anemia type     Dr. Seretha Dance, MD, MPH [facility] [phone] 08/09/2023

## 2023-08-14 ENCOUNTER — Other Ambulatory Visit: Payer: Self-pay | Admitting: Oncology

## 2023-08-17 DIAGNOSIS — I2510 Atherosclerotic heart disease of native coronary artery without angina pectoris: Secondary | ICD-10-CM | POA: Diagnosis not present

## 2023-08-17 DIAGNOSIS — R197 Diarrhea, unspecified: Secondary | ICD-10-CM | POA: Diagnosis not present

## 2023-08-17 DIAGNOSIS — Z9884 Bariatric surgery status: Secondary | ICD-10-CM | POA: Diagnosis not present

## 2023-08-17 DIAGNOSIS — D509 Iron deficiency anemia, unspecified: Secondary | ICD-10-CM | POA: Diagnosis not present

## 2023-08-17 DIAGNOSIS — Z8249 Family history of ischemic heart disease and other diseases of the circulatory system: Secondary | ICD-10-CM | POA: Diagnosis not present

## 2023-08-17 DIAGNOSIS — I2694 Multiple subsegmental pulmonary emboli without acute cor pulmonale: Secondary | ICD-10-CM | POA: Diagnosis not present

## 2023-08-17 DIAGNOSIS — R634 Abnormal weight loss: Secondary | ICD-10-CM | POA: Diagnosis not present

## 2023-08-17 DIAGNOSIS — E7800 Pure hypercholesterolemia, unspecified: Secondary | ICD-10-CM | POA: Diagnosis not present

## 2023-08-17 DIAGNOSIS — I10 Essential (primary) hypertension: Secondary | ICD-10-CM | POA: Diagnosis not present

## 2023-08-18 ENCOUNTER — Inpatient Hospital Stay

## 2023-08-18 VITALS — BP 106/54 | HR 56 | Temp 98.1°F | Resp 17

## 2023-08-18 DIAGNOSIS — Z8249 Family history of ischemic heart disease and other diseases of the circulatory system: Secondary | ICD-10-CM | POA: Diagnosis not present

## 2023-08-18 DIAGNOSIS — D508 Other iron deficiency anemias: Secondary | ICD-10-CM

## 2023-08-18 DIAGNOSIS — R5383 Other fatigue: Secondary | ICD-10-CM | POA: Diagnosis not present

## 2023-08-18 DIAGNOSIS — Z8349 Family history of other endocrine, nutritional and metabolic diseases: Secondary | ICD-10-CM | POA: Diagnosis not present

## 2023-08-18 DIAGNOSIS — D509 Iron deficiency anemia, unspecified: Secondary | ICD-10-CM | POA: Diagnosis not present

## 2023-08-18 DIAGNOSIS — E785 Hyperlipidemia, unspecified: Secondary | ICD-10-CM | POA: Diagnosis not present

## 2023-08-18 DIAGNOSIS — I10 Essential (primary) hypertension: Secondary | ICD-10-CM | POA: Diagnosis not present

## 2023-08-18 DIAGNOSIS — Z79899 Other long term (current) drug therapy: Secondary | ICD-10-CM | POA: Diagnosis not present

## 2023-08-18 DIAGNOSIS — Z9884 Bariatric surgery status: Secondary | ICD-10-CM | POA: Diagnosis not present

## 2023-08-18 DIAGNOSIS — M19041 Primary osteoarthritis, right hand: Secondary | ICD-10-CM | POA: Diagnosis not present

## 2023-08-18 MED ORDER — IRON SUCROSE 20 MG/ML IV SOLN
200.0000 mg | INTRAVENOUS | Status: DC
  Administered 2023-08-18: 200 mg via INTRAVENOUS
  Filled 2023-08-18: qty 10

## 2023-08-18 MED ORDER — SODIUM CHLORIDE 0.9% FLUSH
10.0000 mL | Freq: Once | INTRAVENOUS | Status: AC | PRN
  Administered 2023-08-18: 10 mL
  Filled 2023-08-18: qty 10

## 2023-08-18 NOTE — Patient Instructions (Signed)

## 2023-08-18 NOTE — Progress Notes (Signed)
Patient tolerated Venofer infusion well, no questions/concerns voiced. Monitored 30 min post transfusion. Patient stable at discharge. VSS. AVS given.

## 2023-08-21 ENCOUNTER — Inpatient Hospital Stay

## 2023-08-21 VITALS — BP 110/59 | HR 61 | Temp 96.2°F | Resp 16

## 2023-08-21 DIAGNOSIS — Z8249 Family history of ischemic heart disease and other diseases of the circulatory system: Secondary | ICD-10-CM | POA: Diagnosis not present

## 2023-08-21 DIAGNOSIS — D649 Anemia, unspecified: Secondary | ICD-10-CM | POA: Diagnosis not present

## 2023-08-21 DIAGNOSIS — D508 Other iron deficiency anemias: Secondary | ICD-10-CM

## 2023-08-21 DIAGNOSIS — Z9884 Bariatric surgery status: Secondary | ICD-10-CM | POA: Diagnosis not present

## 2023-08-21 DIAGNOSIS — Z79899 Other long term (current) drug therapy: Secondary | ICD-10-CM | POA: Diagnosis not present

## 2023-08-21 DIAGNOSIS — K5989 Other specified functional intestinal disorders: Secondary | ICD-10-CM | POA: Diagnosis not present

## 2023-08-21 DIAGNOSIS — E785 Hyperlipidemia, unspecified: Secondary | ICD-10-CM | POA: Diagnosis not present

## 2023-08-21 DIAGNOSIS — R634 Abnormal weight loss: Secondary | ICD-10-CM | POA: Diagnosis not present

## 2023-08-21 DIAGNOSIS — M19041 Primary osteoarthritis, right hand: Secondary | ICD-10-CM | POA: Diagnosis not present

## 2023-08-21 DIAGNOSIS — Z8349 Family history of other endocrine, nutritional and metabolic diseases: Secondary | ICD-10-CM | POA: Diagnosis not present

## 2023-08-21 DIAGNOSIS — I10 Essential (primary) hypertension: Secondary | ICD-10-CM | POA: Diagnosis not present

## 2023-08-21 DIAGNOSIS — R5383 Other fatigue: Secondary | ICD-10-CM | POA: Diagnosis not present

## 2023-08-21 DIAGNOSIS — R197 Diarrhea, unspecified: Secondary | ICD-10-CM | POA: Diagnosis not present

## 2023-08-21 DIAGNOSIS — D509 Iron deficiency anemia, unspecified: Secondary | ICD-10-CM | POA: Diagnosis not present

## 2023-08-21 MED ORDER — IRON SUCROSE 20 MG/ML IV SOLN
200.0000 mg | INTRAVENOUS | Status: DC
  Administered 2023-08-21: 200 mg via INTRAVENOUS
  Filled 2023-08-21: qty 10

## 2023-08-21 MED ORDER — SODIUM CHLORIDE 0.9% FLUSH
10.0000 mL | Freq: Once | INTRAVENOUS | Status: AC | PRN
  Administered 2023-08-21: 10 mL
  Filled 2023-08-21: qty 10

## 2023-08-21 NOTE — Progress Notes (Signed)
 Patient tolerated Venofer  infusion well, no questions/concerns voiced.Monitored post transfusion. Patient stable at discharge. AVS given.

## 2023-08-25 ENCOUNTER — Inpatient Hospital Stay: Attending: Oncology

## 2023-08-25 VITALS — BP 97/54 | HR 66 | Temp 97.2°F | Resp 18

## 2023-08-25 DIAGNOSIS — D508 Other iron deficiency anemias: Secondary | ICD-10-CM

## 2023-08-25 DIAGNOSIS — D509 Iron deficiency anemia, unspecified: Secondary | ICD-10-CM | POA: Insufficient documentation

## 2023-08-25 DIAGNOSIS — Z79899 Other long term (current) drug therapy: Secondary | ICD-10-CM | POA: Insufficient documentation

## 2023-08-25 MED ORDER — IRON SUCROSE 20 MG/ML IV SOLN
200.0000 mg | INTRAVENOUS | Status: DC
  Administered 2023-08-25: 200 mg via INTRAVENOUS

## 2023-08-25 NOTE — Patient Instructions (Signed)

## 2023-08-28 ENCOUNTER — Inpatient Hospital Stay

## 2023-08-28 VITALS — BP 117/46 | HR 64 | Temp 97.3°F | Resp 18

## 2023-08-28 DIAGNOSIS — I129 Hypertensive chronic kidney disease with stage 1 through stage 4 chronic kidney disease, or unspecified chronic kidney disease: Secondary | ICD-10-CM | POA: Diagnosis not present

## 2023-08-28 DIAGNOSIS — D508 Other iron deficiency anemias: Secondary | ICD-10-CM

## 2023-08-28 DIAGNOSIS — N1832 Chronic kidney disease, stage 3b: Secondary | ICD-10-CM | POA: Diagnosis not present

## 2023-08-28 DIAGNOSIS — Z79899 Other long term (current) drug therapy: Secondary | ICD-10-CM | POA: Diagnosis not present

## 2023-08-28 DIAGNOSIS — D509 Iron deficiency anemia, unspecified: Secondary | ICD-10-CM | POA: Diagnosis not present

## 2023-08-28 MED ORDER — IRON SUCROSE 20 MG/ML IV SOLN
200.0000 mg | INTRAVENOUS | Status: DC
  Administered 2023-08-28: 200 mg via INTRAVENOUS

## 2023-08-28 NOTE — Patient Instructions (Signed)

## 2023-08-31 ENCOUNTER — Other Ambulatory Visit: Payer: Self-pay | Admitting: Gastroenterology

## 2023-08-31 DIAGNOSIS — R634 Abnormal weight loss: Secondary | ICD-10-CM

## 2023-08-31 DIAGNOSIS — R197 Diarrhea, unspecified: Secondary | ICD-10-CM

## 2023-09-01 ENCOUNTER — Inpatient Hospital Stay

## 2023-09-01 VITALS — BP 131/58 | HR 67 | Temp 98.5°F | Resp 17

## 2023-09-01 DIAGNOSIS — Z79899 Other long term (current) drug therapy: Secondary | ICD-10-CM | POA: Diagnosis not present

## 2023-09-01 DIAGNOSIS — E538 Deficiency of other specified B group vitamins: Secondary | ICD-10-CM | POA: Diagnosis not present

## 2023-09-01 DIAGNOSIS — D508 Other iron deficiency anemias: Secondary | ICD-10-CM

## 2023-09-01 DIAGNOSIS — D509 Iron deficiency anemia, unspecified: Secondary | ICD-10-CM | POA: Diagnosis not present

## 2023-09-01 MED ORDER — IRON SUCROSE 20 MG/ML IV SOLN
200.0000 mg | INTRAVENOUS | Status: DC
  Administered 2023-09-01: 200 mg via INTRAVENOUS

## 2023-09-01 NOTE — Patient Instructions (Signed)
 [facility] - A DEPT OF [facility]  Discharge Instructions: Thank you for choosing [facility] to provide your oncology and hematology care.  If you have a lab appointment with the Cancer Center, please go directly to the Cancer Center and check in at the registration area.  Wear comfortable clothing and clothing appropriate for easy access to any Portacath or PICC line.   We strive to give you quality time with your provider. You may need to reschedule your appointment if you arrive late (15 or more minutes).  Arriving late affects you and other patients whose appointments are after yours.  Also, if you miss three or more appointments without notifying the office, you may be dismissed from the clinic at the provider's discretion.      For prescription refill requests, have your pharmacy contact our office and allow 72 hours for refills to be completed.    Today you received the following chemotherapy and/or immunotherapy agents venofer      To help prevent nausea and vomiting after your treatment, we encourage you to take your nausea medication as directed.  BELOW ARE SYMPTOMS THAT SHOULD BE REPORTED IMMEDIATELY: *FEVER GREATER THAN 100.4 F (38 C) OR HIGHER *CHILLS OR SWEATING *NAUSEA AND VOMITING THAT IS NOT CONTROLLED WITH YOUR NAUSEA MEDICATION *UNUSUAL SHORTNESS OF BREATH *UNUSUAL BRUISING OR BLEEDING *URINARY PROBLEMS (pain or burning when urinating, or frequent urination) *BOWEL PROBLEMS (unusual diarrhea, constipation, pain near the anus) TENDERNESS IN MOUTH AND THROAT WITH OR WITHOUT PRESENCE OF ULCERS (sore throat, sores in mouth, or a toothache) UNUSUAL RASH, SWELLING OR PAIN  UNUSUAL VAGINAL DISCHARGE OR ITCHING   Items with * indicate a potential emergency and should be followed up as soon as possible or go to the Emergency Department if any problems should occur.  Please show the CHEMOTHERAPY ALERT CARD or IMMUNOTHERAPY  ALERT CARD at check-in to the Emergency Department and triage nurse.  Should you have questions after your visit or need to cancel or reschedule your appointment, please contact [facility] - A DEPT OF [facility]  [phone] and follow the prompts.  Office hours are 8:00 a.m. to 4:30 p.m. Monday - Friday. Please note that voicemails left after 4:00 p.m. may not be returned until the following business day.  We are closed weekends and major holidays. You have access to a nurse at all times for urgent questions. Please call the main number to the clinic [phone] and follow the prompts.  For any non-urgent questions, you may also contact your provider using MyChart. We now offer e-Visits for anyone 3 and older to request care online for non-urgent symptoms. For details visit [email or web address].   Also download the MyChart app! Go to the app store, search "MyChart", open the app, select [facility], and log in with your MyChart username and password.

## 2023-09-08 ENCOUNTER — Ambulatory Visit
Admission: RE | Admit: 2023-09-08 | Discharge: 2023-09-08 | Disposition: A | Discharge status: Home / Self Care | Source: Ambulatory Visit | Attending: Gastroenterology | Admitting: Gastroenterology

## 2023-09-08 DIAGNOSIS — R634 Abnormal weight loss: Secondary | ICD-10-CM | POA: Diagnosis not present

## 2023-09-08 DIAGNOSIS — R197 Diarrhea, unspecified: Secondary | ICD-10-CM | POA: Diagnosis not present

## 2023-09-08 DIAGNOSIS — Z90710 Acquired absence of both cervix and uterus: Secondary | ICD-10-CM | POA: Diagnosis not present

## 2023-09-08 LAB — POCT I-STAT CREATININE: Creatinine, Ser: 1.8 mg/dL — ABNORMAL HIGH (ref 0.44–1.00)

## 2023-09-08 MED ORDER — IOHEXOL 300 MG/ML  SOLN
100.0000 mL | Freq: Once | INTRAMUSCULAR | Status: AC | PRN
Start: 2023-09-08 — End: 2023-09-08
  Administered 2023-09-08: 100 mL via INTRAVENOUS

## 2023-10-06 ENCOUNTER — Other Ambulatory Visit: Payer: Self-pay | Admitting: *Deleted

## 2023-10-06 DIAGNOSIS — D509 Iron deficiency anemia, unspecified: Secondary | ICD-10-CM

## 2023-10-06 NOTE — Progress Notes (Signed)
 cbc

## 2023-10-09 ENCOUNTER — Inpatient Hospital Stay: Attending: Oncology

## 2023-10-09 DIAGNOSIS — D509 Iron deficiency anemia, unspecified: Secondary | ICD-10-CM | POA: Insufficient documentation

## 2023-10-09 DIAGNOSIS — Z79899 Other long term (current) drug therapy: Secondary | ICD-10-CM | POA: Insufficient documentation

## 2023-10-09 LAB — IRON AND TIBC
Iron: 115 ug/dL (ref 28–170)
Saturation Ratios: 32 % — ABNORMAL HIGH (ref 10.4–31.8)
TIBC: 357 ug/dL (ref 250–450)
UIBC: 242 ug/dL

## 2023-10-09 LAB — CBC WITH DIFFERENTIAL/PLATELET
Abs Immature Granulocytes: 0.03 10*3/uL (ref 0.00–0.07)
Basophils Absolute: 0.1 10*3/uL (ref 0.0–0.1)
Basophils Relative: 1 %
Eosinophils Absolute: 0.2 10*3/uL (ref 0.0–0.5)
Eosinophils Relative: 3 %
HCT: 34 % — ABNORMAL LOW (ref 36.0–46.0)
Hemoglobin: 11.2 g/dL — ABNORMAL LOW (ref 12.0–15.0)
Immature Granulocytes: 1 %
Lymphocytes Relative: 28 %
Lymphs Abs: 1.8 10*3/uL (ref 0.7–4.0)
MCH: 31 pg (ref 26.0–34.0)
MCHC: 32.9 g/dL (ref 30.0–36.0)
MCV: 94.2 fL (ref 80.0–100.0)
Monocytes Absolute: 0.5 10*3/uL (ref 0.1–1.0)
Monocytes Relative: 8 %
Neutro Abs: 3.9 10*3/uL (ref 1.7–7.7)
Neutrophils Relative %: 59 %
Platelets: 203 10*3/uL (ref 150–400)
RBC: 3.61 MIL/uL — ABNORMAL LOW (ref 3.87–5.11)
RDW: 13.8 % (ref 11.5–15.5)
WBC: 6.5 10*3/uL (ref 4.0–10.5)
nRBC: 0 % (ref 0.0–0.2)

## 2023-10-09 LAB — FERRITIN: Ferritin: 305 ng/mL (ref 11–307)

## 2023-10-24 DIAGNOSIS — I10 Essential (primary) hypertension: Secondary | ICD-10-CM | POA: Diagnosis not present

## 2023-10-24 DIAGNOSIS — H5212 Myopia, left eye: Secondary | ICD-10-CM | POA: Diagnosis not present

## 2023-10-24 DIAGNOSIS — H5213 Myopia, bilateral: Secondary | ICD-10-CM | POA: Diagnosis not present

## 2023-11-09 DIAGNOSIS — K219 Gastro-esophageal reflux disease without esophagitis: Secondary | ICD-10-CM | POA: Diagnosis not present

## 2023-11-09 DIAGNOSIS — D509 Iron deficiency anemia, unspecified: Secondary | ICD-10-CM | POA: Diagnosis not present

## 2023-11-09 DIAGNOSIS — Z8619 Personal history of other infectious and parasitic diseases: Secondary | ICD-10-CM | POA: Diagnosis not present

## 2023-11-30 DIAGNOSIS — I10 Essential (primary) hypertension: Secondary | ICD-10-CM | POA: Diagnosis not present

## 2023-11-30 DIAGNOSIS — M25552 Pain in left hip: Secondary | ICD-10-CM | POA: Diagnosis not present

## 2023-11-30 DIAGNOSIS — Z0000 Encounter for general adult medical examination without abnormal findings: Secondary | ICD-10-CM | POA: Diagnosis not present

## 2023-11-30 DIAGNOSIS — F3341 Major depressive disorder, recurrent, in partial remission: Secondary | ICD-10-CM | POA: Diagnosis not present

## 2023-11-30 DIAGNOSIS — R790 Abnormal level of blood mineral: Secondary | ICD-10-CM | POA: Diagnosis not present

## 2023-11-30 DIAGNOSIS — D508 Other iron deficiency anemias: Secondary | ICD-10-CM | POA: Diagnosis not present

## 2023-12-07 DIAGNOSIS — K219 Gastro-esophageal reflux disease without esophagitis: Secondary | ICD-10-CM | POA: Diagnosis not present

## 2023-12-07 DIAGNOSIS — E876 Hypokalemia: Secondary | ICD-10-CM | POA: Diagnosis not present

## 2023-12-07 DIAGNOSIS — I10 Essential (primary) hypertension: Secondary | ICD-10-CM | POA: Diagnosis not present

## 2023-12-07 DIAGNOSIS — F324 Major depressive disorder, single episode, in partial remission: Secondary | ICD-10-CM | POA: Diagnosis not present

## 2023-12-07 DIAGNOSIS — D649 Anemia, unspecified: Secondary | ICD-10-CM | POA: Diagnosis not present

## 2023-12-12 ENCOUNTER — Other Ambulatory Visit

## 2023-12-12 ENCOUNTER — Ambulatory Visit: Admitting: Oncology

## 2024-01-18 DIAGNOSIS — F3341 Major depressive disorder, recurrent, in partial remission: Secondary | ICD-10-CM | POA: Diagnosis not present

## 2024-01-18 DIAGNOSIS — I10 Essential (primary) hypertension: Secondary | ICD-10-CM | POA: Diagnosis not present

## 2024-01-18 DIAGNOSIS — I2510 Atherosclerotic heart disease of native coronary artery without angina pectoris: Secondary | ICD-10-CM | POA: Diagnosis not present

## 2024-01-18 DIAGNOSIS — R29898 Other symptoms and signs involving the musculoskeletal system: Secondary | ICD-10-CM | POA: Diagnosis not present

## 2024-01-18 DIAGNOSIS — H538 Other visual disturbances: Secondary | ICD-10-CM | POA: Diagnosis not present

## 2024-02-20 ENCOUNTER — Other Ambulatory Visit: Payer: Self-pay

## 2024-02-20 DIAGNOSIS — D509 Iron deficiency anemia, unspecified: Secondary | ICD-10-CM

## 2024-02-21 ENCOUNTER — Inpatient Hospital Stay: Attending: Oncology

## 2024-02-21 ENCOUNTER — Inpatient Hospital Stay (HOSPITAL_BASED_OUTPATIENT_CLINIC_OR_DEPARTMENT_OTHER): Admitting: Oncology

## 2024-02-21 ENCOUNTER — Encounter: Payer: Self-pay | Admitting: Oncology

## 2024-02-21 VITALS — BP 125/64 | HR 70 | Temp 98.6°F | Resp 18 | Ht 64.0 in | Wt 168.0 lb

## 2024-02-21 DIAGNOSIS — Z79899 Other long term (current) drug therapy: Secondary | ICD-10-CM | POA: Insufficient documentation

## 2024-02-21 DIAGNOSIS — Z87442 Personal history of urinary calculi: Secondary | ICD-10-CM | POA: Insufficient documentation

## 2024-02-21 DIAGNOSIS — Z8349 Family history of other endocrine, nutritional and metabolic diseases: Secondary | ICD-10-CM | POA: Diagnosis not present

## 2024-02-21 DIAGNOSIS — R5383 Other fatigue: Secondary | ICD-10-CM | POA: Insufficient documentation

## 2024-02-21 DIAGNOSIS — K5710 Diverticulosis of small intestine without perforation or abscess without bleeding: Secondary | ICD-10-CM | POA: Insufficient documentation

## 2024-02-21 DIAGNOSIS — Z90710 Acquired absence of both cervix and uterus: Secondary | ICD-10-CM | POA: Diagnosis not present

## 2024-02-21 DIAGNOSIS — Z8616 Personal history of COVID-19: Secondary | ICD-10-CM | POA: Insufficient documentation

## 2024-02-21 DIAGNOSIS — Z8249 Family history of ischemic heart disease and other diseases of the circulatory system: Secondary | ICD-10-CM | POA: Insufficient documentation

## 2024-02-21 DIAGNOSIS — Z9049 Acquired absence of other specified parts of digestive tract: Secondary | ICD-10-CM | POA: Insufficient documentation

## 2024-02-21 DIAGNOSIS — D509 Iron deficiency anemia, unspecified: Secondary | ICD-10-CM | POA: Diagnosis not present

## 2024-02-21 DIAGNOSIS — Z9884 Bariatric surgery status: Secondary | ICD-10-CM | POA: Diagnosis not present

## 2024-02-21 DIAGNOSIS — M19041 Primary osteoarthritis, right hand: Secondary | ICD-10-CM | POA: Insufficient documentation

## 2024-02-21 DIAGNOSIS — E669 Obesity, unspecified: Secondary | ICD-10-CM | POA: Insufficient documentation

## 2024-02-21 DIAGNOSIS — Z8419 Family history of other disorders of kidney and ureter: Secondary | ICD-10-CM | POA: Diagnosis not present

## 2024-02-21 LAB — CBC WITH DIFFERENTIAL (CANCER CENTER ONLY)
Abs Immature Granulocytes: 0.03 K/uL (ref 0.00–0.07)
Basophils Absolute: 0 K/uL (ref 0.0–0.1)
Basophils Relative: 1 %
Eosinophils Absolute: 0.2 K/uL (ref 0.0–0.5)
Eosinophils Relative: 3 %
HCT: 35.7 % — ABNORMAL LOW (ref 36.0–46.0)
Hemoglobin: 11.7 g/dL — ABNORMAL LOW (ref 12.0–15.0)
Immature Granulocytes: 0 %
Lymphocytes Relative: 35 %
Lymphs Abs: 2.5 K/uL (ref 0.7–4.0)
MCH: 30.7 pg (ref 26.0–34.0)
MCHC: 32.8 g/dL (ref 30.0–36.0)
MCV: 93.7 fL (ref 80.0–100.0)
Monocytes Absolute: 0.5 K/uL (ref 0.1–1.0)
Monocytes Relative: 8 %
Neutro Abs: 3.8 K/uL (ref 1.7–7.7)
Neutrophils Relative %: 53 %
Platelet Count: 234 K/uL (ref 150–400)
RBC: 3.81 MIL/uL — ABNORMAL LOW (ref 3.87–5.11)
RDW: 13 % (ref 11.5–15.5)
WBC Count: 7.1 K/uL (ref 4.0–10.5)
nRBC: 0 % (ref 0.0–0.2)

## 2024-02-21 LAB — IRON AND TIBC
Iron: 108 ug/dL (ref 28–170)
Saturation Ratios: 29 % (ref 10.4–31.8)
TIBC: 378 ug/dL (ref 250–450)
UIBC: 270 ug/dL

## 2024-02-21 LAB — FERRITIN: Ferritin: 227 ng/mL (ref 11–307)

## 2024-02-22 ENCOUNTER — Encounter: Payer: Self-pay | Admitting: Oncology

## 2024-02-22 NOTE — Progress Notes (Signed)
 Hematology/Oncology Consult note [facility]  Telephone:([phone] [phone]  Patient Care Team: Sadie Manna, MD as PCP - General (Internal Medicine) Melanee Annah BROCKS, MD as Consulting Physician (Oncology)   Name of the patient: Alexandria Newman  [id number]  09-01-1950   Date of visit: 02/22/24  Diagnosis- iron  deficiency anemia  Chief complaint/ Reason for visit- routine f/u of iron  deficiency anemia  Heme/Onc history: patient is a 73 year old female with a past medical history significant for hypertension hyperlipidemia, s/p gastric bypass, osteoarthritis who has been referred for iron  deficiency anemia.  CBC from 07/26/2023 showed white count of 8.7, H&H of 11.1/33.6 with an MCV of 93.3 and a platelet count of 254.  Ferritin levels were low at 34.  B12 levels normal.  TSH normal at 2.1.  Patient was last seen by me back in July 2019 for iron  deficiency anemia and received IV iron  in May 2018.  She was also receiving B12 injections monthly until 2019.  Last EGD and colonoscopy was in August 2024 by Dr. Onita.  EGD showed evidence of gastric bypass and nonbleeding jejunal diverticulum.  Colonoscopy preparation was poor and repeat colonoscopy was recommended in 1 year    Interval history- Discussed the use of AI scribe software for clinical note transcription with the patient, who gave verbal consent to proceed.  History of Present Illness   Alexandria Newman is a 73 year old female who presents with fatigue and requests iron  infusions.  She experiences persistent fatigue and is seeking additional iron  infusions. Her last infusion was in May or June 2025, when her ferritin level was 34.  In August 2025, her ferritin level was checked and was 214. She is currently awaiting the results of her latest iron  level tests to determine eligibility for another infusion.  Her B12 levels were last assessed in April 2025 and were reported as  adequate. She plans to have these levels rechecked in four months.  In August 2025, she underwent a comprehensive check-up, including thyroid  function tests, which were reported as normal.       History of Present Illness    ECOG PS- 1 Pain scale- 0   Review of systems- Review of Systems  Constitutional:  Positive for malaise/fatigue. Negative for chills, fever and weight loss.  HENT:  Negative for congestion, ear discharge and nosebleeds.   Eyes:  Negative for blurred vision.  Respiratory:  Negative for cough, hemoptysis, sputum production, shortness of breath and wheezing.   Cardiovascular:  Negative for chest pain, palpitations, orthopnea and claudication.  Gastrointestinal:  Negative for abdominal pain, blood in stool, constipation, diarrhea, heartburn, melena, nausea and vomiting.  Genitourinary:  Negative for dysuria, flank pain, frequency, hematuria and urgency.  Musculoskeletal:  Negative for back pain, joint pain and myalgias.  Skin:  Negative for rash.  Neurological:  Negative for dizziness, tingling, focal weakness, seizures, weakness and headaches.  Endo/Heme/Allergies:  Does not bruise/bleed easily.  Psychiatric/Behavioral:  Negative for depression and suicidal ideas. The patient does not have insomnia.       No Known Allergies   Past Medical History:  Diagnosis Date   Anemia    Anxiety    B12 deficiency    Chronic insomnia    COVID-19    10-15-20   Degeneration of cervical intervertebral disc    Depression    Dumping syndrome 2016   per pt had part of intestines removed   Dysfunction of both eustachian tubes    History  of kidney stones    Hypercholesteremia    Hypertension    Metabolic syndrome    Obesity    Osteoarthritis of right hand    Ovarian failure    TMJ (dislocation of temporomandibular joint)    Vitamin D  deficiency      Past Surgical History:  Procedure Laterality Date   ABDOMINAL HYSTERECTOMY     APPENDECTOMY     BIOPSY   11/24/2022   Procedure: BIOPSY;  Surgeon: Onita Elspeth Sharper, DO;  Location: [facility] ENDOSCOPY;  Service: Gastroenterology;;   COLONOSCOPY WITH PROPOFOL  N/A 03/21/2016   Procedure: COLONOSCOPY WITH PROPOFOL ;  Surgeon: Ruel Kung, MD;  Location: [facility] ENDOSCOPY;  Service: Endoscopy;  Laterality: N/A;   COLONOSCOPY WITH PROPOFOL  N/A 11/24/2022   Procedure: COLONOSCOPY WITH PROPOFOL ;  Surgeon: Onita Elspeth Sharper, DO;  Location: [facility] ENDOSCOPY;  Service: Gastroenterology;  Laterality: N/A;   ESOPHAGOGASTRODUODENOSCOPY (EGD) WITH PROPOFOL  N/A 03/21/2016   Procedure: ESOPHAGOGASTRODUODENOSCOPY (EGD) WITH PROPOFOL ;  Surgeon: Ruel Kung, MD;  Location: [facility] ENDOSCOPY;  Service: Endoscopy;  Laterality: N/A;   ESOPHAGOGASTRODUODENOSCOPY (EGD) WITH PROPOFOL  N/A 11/24/2022   Procedure: ESOPHAGOGASTRODUODENOSCOPY (EGD) WITH PROPOFOL ;  Surgeon: Onita Elspeth Sharper, DO;  Location: [facility] ENDOSCOPY;  Service: Gastroenterology;  Laterality: N/A;   GASTRIC BYPASS  2009   LAPAROSCOPIC CHOLECYSTECTOMY SINGLE SITE WITH INTRAOPERATIVE CHOLANGIOGRAM N/A 11/10/2020   Procedure: LAPAROSCOPIC CHOLECYSTECTOMY SINGLE SITE WITH INTRAOPERATIVE CHOLANGIOGRAM;  Surgeon: Kinsinger, Herlene Righter, MD;  Location: [facility] ORS;  Service: General;  Laterality: N/A;  60   LAPAROSCOPY N/A 01/05/2014   Procedure: LAPAROSCOPY DIAGNOSTIC CONVERTED TO  LAPAROTOMY ILECTOMY;  Surgeon: Debby Shipper, MD;  Location: [facility] OR;  Service: General;  Laterality: N/A;   PARTIAL COLECTOMY     POLYPECTOMY  11/24/2022   Procedure: POLYPECTOMY;  Surgeon: Onita Elspeth Sharper, DO;  Location: [facility] ENDOSCOPY;  Service: Gastroenterology;;   SHELLEE TOOTH EXTRACTION      Social History   Socioeconomic History   Marital status: Married    Spouse name: Jerel   Number of children: 2   Years of education: GED   Highest education level: Not on file  Occupational History   Occupation: Retired  Tobacco Use   Smoking status: Never   Smokeless tobacco: Never   Tobacco  comments:    smoking cessation materials not required  Vaping Use   Vaping status: Never Used  Substance and Sexual Activity   Alcohol  use: Yes    Comment: Rarely   Drug use: No   Sexual activity: Never    Birth control/protection: Surgical  Other Topics Concern   Not on file  Social History Narrative   Not on file   Social Drivers of Health   Financial Resource Strain: Low Risk  (08/03/2023)   Received from [facility]   Overall Financial Resource Strain (CARDIA)    Difficulty of Paying Living Expenses: Not hard at all  Food Insecurity: No Food Insecurity (08/09/2023)   Hunger Vital Sign    Worried About Running Out of Food in the Last Year: Never true    Ran Out of Food in the Last Year: Never true  Transportation Needs: No Transportation Needs (08/09/2023)   PRAPARE - Administrator, Civil Service (Medical): No    Lack of Transportation (Non-Medical): No  Physical Activity: Insufficiently Active (06/02/2017)   Exercise Vital Sign    Days of Exercise per Week: 2 days    Minutes of Exercise per Session: 30 min  Stress: No Stress Concern Present (06/02/2017)  [employer] of Occupational Health - Occupational Stress Questionnaire    Feeling of Stress : Not at all  Social Connections: Unknown (06/02/2017)   Social Connection and Isolation Panel    Frequency of Communication with Friends and Family: Patient declined    Frequency of Social Gatherings with Friends and Family: Patient declined    Attends Religious Services: Patient declined    Database Administrator or Organizations: Patient declined    Attends Banker Meetings: Patient declined    Marital Status: Married  Catering Manager Violence: Not At Risk (08/09/2023)   Humiliation, Afraid, Rape, and Kick questionnaire    Fear of Current or Ex-Partner: No    Emotionally Abused: No    Physically Abused: No    Sexually Abused: No    Family History  Problem Relation Age of Onset    Heart disease Mother    CAD Mother    Kidney disease Mother    Heart attack Father    Obesity Sister    Heart attack Brother        after surgery     Current Outpatient Medications:    acetaminophen  (TYLENOL ) 325 MG tablet, Take 2 tablets (650 mg total) by mouth every 6 (six) hours as needed for mild pain (or Temp > 100)., Disp: , Rfl:    ALPRAZolam  (XANAX ) 0.5 MG tablet, Take 0.5 mg by mouth at bedtime., Disp: , Rfl:    buPROPion  (WELLBUTRIN  XL) 150 MG 24 hr tablet, TAKE 1 TABLET EVERY DAY, Disp: 90 tablet, Rfl: 0   cyanocobalamin  (,VITAMIN B-12,) 1000 MCG/ML injection, Inject 1,000 mcg into the muscle every 30 (thirty) days., Disp: , Rfl:    hydrochlorothiazide (HYDRODIURIL) 12.5 MG tablet, Take 12.5 mg by mouth daily., Disp: , Rfl:    lisinopril  (ZESTRIL ) 40 MG tablet, Take 40 mg by mouth daily., Disp: , Rfl:    metoprolol  succinate (TOPROL -XL) 25 MG 24 hr tablet, Take 25 mg by mouth., Disp: , Rfl:    hydrALAZINE  (APRESOLINE ) 25 MG tablet, Take 25 mg by mouth 2 (two) times daily as needed (high BP). (Patient not taking: Reported on 02/21/2024), Disp: , Rfl:    omeprazole (PRILOSEC) 20 MG capsule, Take 20 mg by mouth daily., Disp: , Rfl:   Current Facility-Administered Medications:    cyanocobalamin  ((VITAMIN B-12)) injection 1,000 mcg, 1,000 mcg, Subcutaneous, Q30 days, Sowles, Krichna, MD, 1,000 mcg at 11/16/17 9072  Physical exam:  Vitals:   02/21/24 1506  BP: 125/64  Pulse: 70  Resp: 18  Temp: 98.6 F (37 C)  TempSrc: Tympanic  SpO2: 98%  Weight: 168 lb (76.2 kg)  Height: 5' 4 (1.626 m)   Physical Exam Cardiovascular:     Rate and Rhythm: Normal rate and regular rhythm.     Heart sounds: Normal heart sounds.  Pulmonary:     Effort: Pulmonary effort is normal.     Breath sounds: Normal breath sounds.  Skin:    General: Skin is warm and dry.  Neurological:     Mental Status: She is alert and oriented to person, place, and time.      I have personally  reviewed labs listed below:    Latest Ref Rng & Units 09/08/2023    1:16 PM  CMP  Creatinine 0.44 - 1.00 mg/dL 8.19       Latest Ref Rng & Units 02/21/2024    2:50 PM  CBC  WBC 4.0 - 10.5 K/uL 7.1   Hemoglobin 12.0 - 15.0 g/dL  11.7   Hematocrit 36.0 - 46.0 % 35.7   Platelets 150 - 400 K/uL 234      Assessment and plan- Patient is a 73 y.o. female here for routine f/u of iron  deficiency anemia  Assessment and Plan    Iron  deficiency Ferritin was 34 in the past necessitating IV iron . Current levels pending. Treatment based on results. Ferritin <50 confirms deficiency. - Review current iron  levels when available. - Schedule iron  infusion if ferritin <50.  Fatigue Ongoing fatigue. B12 and thyroid  function normal. Possible relation to iron  deficiency if levels are low.         Visit Diagnosis 1. Iron  deficiency anemia, unspecified iron  deficiency anemia type      Dr. Annah Skene, MD, MPH [facility] [phone] 02/22/2024 6:14 PM

## 2024-02-26 DIAGNOSIS — E538 Deficiency of other specified B group vitamins: Secondary | ICD-10-CM | POA: Diagnosis not present

## 2024-03-20 DIAGNOSIS — H04123 Dry eye syndrome of bilateral lacrimal glands: Secondary | ICD-10-CM | POA: Diagnosis not present

## 2024-03-20 DIAGNOSIS — Z961 Presence of intraocular lens: Secondary | ICD-10-CM | POA: Diagnosis not present

## 2024-03-20 DIAGNOSIS — H538 Other visual disturbances: Secondary | ICD-10-CM | POA: Diagnosis not present

## 2024-05-05 ENCOUNTER — Ambulatory Visit

## 2024-05-05 ENCOUNTER — Ambulatory Visit: Admission: EM | Admit: 2024-05-05 | Discharge: 2024-05-05 | Disposition: A | Discharge status: Home / Self Care

## 2024-05-05 ENCOUNTER — Encounter: Payer: Self-pay | Admitting: Emergency Medicine

## 2024-05-05 DIAGNOSIS — S92354A Nondisplaced fracture of fifth metatarsal bone, right foot, initial encounter for closed fracture: Secondary | ICD-10-CM | POA: Diagnosis not present

## 2024-05-05 DIAGNOSIS — S8392XA Sprain of unspecified site of left knee, initial encounter: Secondary | ICD-10-CM

## 2024-05-05 MED ORDER — KETOROLAC TROMETHAMINE 30 MG/ML IJ SOLN
30.0000 mg | Freq: Once | INTRAMUSCULAR | Status: AC
  Administered 2024-05-05: 30 mg via INTRAMUSCULAR

## 2024-05-05 MED ORDER — DEXAMETHASONE SOD PHOSPHATE PF 10 MG/ML IJ SOLN
10.0000 mg | Freq: Once | INTRAMUSCULAR | Status: AC
  Administered 2024-05-05: 10 mg via INTRAMUSCULAR

## 2024-05-05 MED ORDER — TRAMADOL HCL 50 MG PO TABS: 50.0000 mg | ORAL_TABLET | Freq: Four times a day (QID) | ORAL | 0 refills | Status: AC | PRN

## 2024-05-05 NOTE — ED Triage Notes (Signed)
 Pt reports fall that occurred yesterday evening. She was walking down steps and missed one - fell, twisting her R ankle and L leg. Pt reports severe R foot and ankle pain as well as posterior L knee pain. Swelling and bruising to R foot and ankle. L knee pain is only with standing and certain motions - no bruising or swelling. Pt is using a CAM boot they had at home and reports she can stand and walk w/ moderate difficulty with it on. Used salonpas pads with little relief.

## 2024-05-05 NOTE — ED Provider Notes (Signed)
 " [facility]    CSN: [id number] Arrival date & time: 05/05/24  9178      History   Chief Complaint Chief Complaint  Patient presents with   Fall   Ankle Pain   Foot Pain   Knee Pain    HPI Alexandria Newman is a 74 y.o. female.   Pt presents today due to injury that happened yesterday. Pt states that she was walking downstairs and missed a step. Pt states that she twisted her right ankle and attempted to catch herself with left foot but twist left knee. Pt states that she is experiencing no pain at rest but states that she has 9/10 pain with weightbearing and is using a CAM boot and 2 canes for assistance. Pt states that she used 1000 mg of Tylenol  and OTC lidocaine  patches for pain with no relief.   The history is provided by the patient.  Fall  Ankle Pain Foot Pain  Knee Pain   Past Medical History:  Diagnosis Date   Anemia    Anxiety    B12 deficiency    Chronic insomnia    COVID-19    10-15-20   Degeneration of cervical intervertebral disc    Depression    Dumping syndrome 2016   per pt had part of intestines removed   Dysfunction of both eustachian tubes    History of kidney stones    Hypercholesteremia    Hypertension    Metabolic syndrome    Obesity    Osteoarthritis of right hand    Ovarian failure    TMJ (dislocation of temporomandibular joint)    Vitamin D  deficiency     Patient Active Problem List   Diagnosis Date Noted   Coronary artery disease involving native coronary artery of native heart without angina pectoris 08/17/2023   Chronic left hip pain 08/03/2023   Low ferritin 08/03/2023   Multiple subsegmental pulmonary emboli without acute cor pulmonale (HCC) 08/13/2021   Acute cholecystitis 10/15/2020   Acute gallstone pancreatitis 10/15/2020   COVID-19 virus infection 10/15/2020   Major depressive disorder, recurrent, in partial remission 03/16/2020   Anemia 11/12/2019   Family history of early CAD 11/12/2019    Malaise and fatigue 11/12/2019   Protein calorie malnutrition 11/08/2016   H/O gastric bypass 11/01/2016   Iron  deficiency anemia 02/15/2016   Senile purpura 08/10/2015   Hyperlipidemia 02/04/2015   History of small bowel obstruction 02/04/2015   Hypertension, benign 02/04/2015   DDD (degenerative disc disease), cervical 02/04/2015   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   Insomnia 02/04/2015   Osteoarthritis of finger of right hand 02/04/2015   Metabolic syndrome 02/04/2015   Depression, major, single episode, mild 02/04/2015   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   Vitamin D  deficiency 02/04/2015   Vitamin B12 deficiency 02/04/2015   Bariatric surgery status     Past Surgical History:  Procedure Laterality Date   ABDOMINAL HYSTERECTOMY     APPENDECTOMY     BIOPSY  11/24/2022   Procedure: BIOPSY;  Surgeon: Onita Elspeth Sharper, DO;  Location: [facility] ENDOSCOPY;  Service: Gastroenterology;;   COLONOSCOPY WITH PROPOFOL  N/A 03/21/2016   Procedure: COLONOSCOPY WITH PROPOFOL ;  Surgeon: Ruel Kung, MD;  Location: [facility] ENDOSCOPY;  Service: Endoscopy;  Laterality: N/A;   COLONOSCOPY WITH PROPOFOL  N/A 11/24/2022   Procedure: COLONOSCOPY WITH PROPOFOL ;  Surgeon: Onita Elspeth Sharper, DO;  Location: [facility] ENDOSCOPY;  Service: Gastroenterology;  Laterality: N/A;   ESOPHAGOGASTRODUODENOSCOPY (EGD) WITH PROPOFOL  N/A 03/21/2016   Procedure: ESOPHAGOGASTRODUODENOSCOPY (  EGD) WITH PROPOFOL ;  Surgeon: Ruel Kung, MD;  Location: [facility] ENDOSCOPY;  Service: Endoscopy;  Laterality: N/A;   ESOPHAGOGASTRODUODENOSCOPY (EGD) WITH PROPOFOL  N/A 11/24/2022   Procedure: ESOPHAGOGASTRODUODENOSCOPY (EGD) WITH PROPOFOL ;  Surgeon: Onita Elspeth Sharper, DO;  Location: [facility] ENDOSCOPY;  Service: Gastroenterology;  Laterality: N/A;   GASTRIC BYPASS  2009   LAPAROSCOPIC CHOLECYSTECTOMY SINGLE SITE WITH INTRAOPERATIVE CHOLANGIOGRAM N/A 11/10/2020   Procedure: LAPAROSCOPIC CHOLECYSTECTOMY SINGLE SITE WITH  INTRAOPERATIVE CHOLANGIOGRAM;  Surgeon: Kinsinger, Herlene Righter, MD;  Location: [facility] ORS;  Service: General;  Laterality: N/A;  60   LAPAROSCOPY N/A 01/05/2014   Procedure: LAPAROSCOPY DIAGNOSTIC CONVERTED TO  LAPAROTOMY ILECTOMY;  Surgeon: Debby Shipper, MD;  Location: [facility] OR;  Service: General;  Laterality: N/A;   PARTIAL COLECTOMY     POLYPECTOMY  11/24/2022   Procedure: POLYPECTOMY;  Surgeon: Onita Elspeth Sharper, DO;  Location: [facility] ENDOSCOPY;  Service: Gastroenterology;;   WISDOM TOOTH EXTRACTION      OB History     Gravida      Para      Term      Preterm      AB      Living  2      SAB      IAB      Ectopic      Multiple      Live Births               Home Medications    Prior to Admission medications  Medication Sig Start Date End Date Taking? Authorizing Provider  acetaminophen  (TYLENOL ) 325 MG tablet Take 2 tablets (650 mg total) by mouth every 6 (six) hours as needed for mild pain (or Temp > 100). 11/07/13  Yes Tonnie George, PA-C  ALPRAZolam  (XANAX ) 0.5 MG tablet Take 0.5 mg by mouth at bedtime. 07/16/20  Yes [provider]  buPROPion  (WELLBUTRIN  XL) 150 MG 24 hr tablet TAKE 1 TABLET EVERY DAY 09/25/17  Yes Sowles, Krichna, MD  citalopram  (CELEXA ) 20 MG tablet Take 20 mg by mouth daily.   Yes [provider]  hydrALAZINE  (APRESOLINE ) 25 MG tablet Take 25 mg by mouth 2 (two) times daily as needed (high BP).   Yes [provider]  hydrochlorothiazide (HYDRODIURIL) 12.5 MG tablet Take 12.5 mg by mouth daily. 03/08/23 05/05/24 Yes [provider]  metoprolol  succinate (TOPROL -XL) 25 MG 24 hr tablet Take 25 mg by mouth. 12/11/23  Yes [provider]  omeprazole (PRILOSEC) 20 MG capsule Take 20 mg by mouth daily.   Yes [provider]  traMADol  (ULTRAM ) 50 MG tablet Take 1 tablet (50 mg total) by mouth every 6 (six) hours as needed. 05/05/24  Yes Andra Krabbe C, PA-C  cyanocobalamin  (,VITAMIN B-12,)  1000 MCG/ML injection Inject 1,000 mcg into the muscle every 30 (thirty) days.    [provider]  lisinopril  (ZESTRIL ) 40 MG tablet Take 40 mg by mouth daily. Patient not taking: Reported on 05/05/2024    [provider]  potassium chloride  (KLOR-CON ) 10 MEQ tablet Take 10 mEq by mouth. Patient not taking: Reported on 05/05/2024 07/27/23 07/26/24  [provider]  scopolamine (TRANSDERM-SCOP) 1 MG/3DAYS  02/29/24   [provider]  sucralfate (CARAFATE) 1 g tablet Take 1 g by mouth 2 (two) times daily. Patient not taking: Reported on 05/05/2024 12/08/23   [provider]    Family History Family History  Problem Relation Age of Onset   Heart disease Mother    CAD Mother  Kidney disease Mother    Heart attack Father    Obesity Sister    Heart attack Brother        after surgery    Social History Social History[1]   Allergies   Patient has no known allergies.   Review of Systems Review of Systems   Physical Exam Triage Vital Signs ED Triage Vitals  Encounter Vitals Group     BP 05/05/24 0926 (!) 160/85     Girls Systolic BP Percentile --      Girls Diastolic BP Percentile --      Boys Systolic BP Percentile --      Boys Diastolic BP Percentile --      Pulse Rate 05/05/24 0926 61     Resp 05/05/24 0926 16     Temp 05/05/24 0926 98.4 F (36.9 C)     Temp Source 05/05/24 0926 Oral     SpO2 05/05/24 0926 96 %     Weight --      Height --      Head Circumference --      Peak Flow --      Pain Score 05/05/24 0921 10     Pain Loc --      Pain Education --      Exclude from Growth Chart --    No data found.  Updated Vital Signs BP (!) 160/85 (BP Location: Left Arm)   Pulse 61   Temp 98.4 F (36.9 C) (Oral)   Resp 16   SpO2 96%   Visual Acuity Right Eye Distance:   Left Eye Distance:   Bilateral Distance:    Right Eye Near:   Left Eye Near:    Bilateral Near:     Physical Exam Vitals and nursing note reviewed.   Constitutional:      General: She is not in acute distress.    Appearance: Normal appearance. She is not ill-appearing, toxic-appearing or diaphoretic.  Eyes:     General: No scleral icterus. Cardiovascular:     Rate and Rhythm: Normal rate and regular rhythm.     Heart sounds: Normal heart sounds.  Pulmonary:     Effort: Pulmonary effort is normal. No respiratory distress.     Breath sounds: Normal breath sounds. No wheezing or rhonchi.  Musculoskeletal:     Right ankle: Swelling present. Tenderness present. Decreased range of motion.       Legs:     Comments: Tenderness where identified on diagram, moderate edema noted of lateral malleolus of right ankle  Skin:    General: Skin is warm.  Neurological:     Mental Status: She is alert and oriented to person, place, and time.  Psychiatric:        Mood and Affect: Mood normal.        Behavior: Behavior normal.      UC Treatments / Results  Labs (all labs ordered are listed, but only abnormal results are displayed) Labs Reviewed - No data to display  EKG   Radiology DG Knee Complete 4 Views Left Result Date: 05/05/2024 CLINICAL DATA:  Status post fall. EXAM: LEFT KNEE - COMPLETE 4+ VIEW COMPARISON:  None Available. FINDINGS: No evidence of an acute fracture or dislocation. Periarticular osteopenic changes are seen. Mild tricompartmental joint space narrowing is present. A small suprapatellar effusion is noted. IMPRESSION: 1. No acute fracture or dislocation. 2. Small suprapatellar effusion. Electronically Signed   By: Suzen Dials M.D.   On: 05/05/2024 10:24  DG Foot Complete Right Result Date: 05/05/2024 CLINICAL DATA:  Status post trauma. EXAM: RIGHT FOOT COMPLETE - 3+ VIEW COMPARISON:  None Available. FINDINGS: Acute nondisplaced fracture deformity is seen involving the base of the fifth right metatarsal. There is no evidence of dislocation. Periarticular osteopenia changes are noted with degenerative changes seen  along the proximal to mid right foot. There is diffuse soft tissue swelling. IMPRESSION: Acute nondisplaced fracture of the base of the fifth right metatarsal. Electronically Signed   By: Suzen Dials M.D.   On: 05/05/2024 10:21   DG Ankle Complete Right Result Date: 05/05/2024 CLINICAL DATA:  Status post fall. EXAM: RIGHT ANKLE - COMPLETE 3+ VIEW COMPARISON:  None Available. FINDINGS: An acute, nondisplaced fracture deformity is seen involving the base of the fifth right metatarsal. There is no evidence of dislocation. Mild to moderate severity degenerative changes are noted along the dorsal aspect of the proximal to mid right foot. Diffuse soft tissue swelling is seen. IMPRESSION: Acute fracture of the base of the fifth right metatarsal. Electronically Signed   By: Suzen Dials M.D.   On: 05/05/2024 10:18    Procedures Procedures (including critical care time)  Medications Ordered in UC Medications  ketorolac  (TORADOL ) 30 MG/ML injection 30 mg (30 mg Intramuscular Given 05/05/24 1022)  dexamethasone  (DECADRON ) injection 10 mg (10 mg Intramuscular Given 05/05/24 1022)    Initial Impression / Assessment and Plan / UC Course  I have reviewed the triage vital signs and the nursing notes.  Pertinent labs & imaging results that were available during my care of the patient were reviewed by me and considered in my medical decision making (see chart for details).     Final Clinical Impressions(s) / UC Diagnoses   Final diagnoses:  Closed nondisplaced fracture of fifth metatarsal bone of right foot, initial encounter  Sprain of left knee, unspecified ligament, initial encounter     Discharge Instructions      You have a fracture in your foot. The CAM boot should help. It takes 4-6 weeks to heal. You may follow-up with ortho if symptoms worsen. I can prescribe a narcotic medicine for pain.      ED Prescriptions     Medication Sig Dispense Auth. Provider   traMADol  (ULTRAM ) 50  MG tablet Take 1 tablet (50 mg total) by mouth every 6 (six) hours as needed. 15 tablet Andra Corean BROCKS, PA-C      I have reviewed the PDMP during this encounter.    [1]  Social History Tobacco Use   Smoking status: Never   Smokeless tobacco: Never   Tobacco comments:    smoking cessation materials not required  Vaping Use   Vaping status: Never Used  Substance Use Topics   Alcohol  use: Yes    Comment: Rarely   Drug use: No     Andra Corean BROCKS, PA-C 05/05/24 1043  "

## 2024-05-05 NOTE — Discharge Instructions (Addendum)
 You have a fracture in your foot. The CAM boot should help. It takes 4-6 weeks to heal. You may follow-up with ortho if symptoms worsen. I can prescribe a narcotic medicine for pain.

## 2024-06-21 ENCOUNTER — Inpatient Hospital Stay

## 2024-10-21 ENCOUNTER — Inpatient Hospital Stay: Admitting: Oncology

## 2024-10-21 ENCOUNTER — Inpatient Hospital Stay
